# Patient Record
Sex: Female | Born: 1965
Health system: Southern US, Community
[De-identification: ages and names within clinical notes are randomized; demographics above are authoritative.]

## PROBLEM LIST (undated history)

## (undated) DIAGNOSIS — M1990 Unspecified osteoarthritis, unspecified site: Secondary | ICD-10-CM

## (undated) DIAGNOSIS — C541 Malignant neoplasm of endometrium: Secondary | ICD-10-CM

## (undated) DIAGNOSIS — C4491 Basal cell carcinoma of skin, unspecified: Secondary | ICD-10-CM

## (undated) DIAGNOSIS — Z8709 Personal history of other diseases of the respiratory system: Secondary | ICD-10-CM

## (undated) DIAGNOSIS — F419 Anxiety disorder, unspecified: Secondary | ICD-10-CM

## (undated) DIAGNOSIS — B999 Unspecified infectious disease: Secondary | ICD-10-CM

## (undated) DIAGNOSIS — E119 Type 2 diabetes mellitus without complications: Secondary | ICD-10-CM

## (undated) DIAGNOSIS — Z923 Personal history of irradiation: Secondary | ICD-10-CM

## (undated) HISTORY — DX: Personal history of irradiation: Z92.3

## (undated) HISTORY — DX: Basal cell carcinoma of skin, unspecified: C44.91

## (undated) HISTORY — PX: BASAL CELL CARCINOMA EXCISION: SHX1214

## (undated) HISTORY — PX: KNEE SURGERY: SHX244

## (undated) HISTORY — DX: Unspecified infectious disease: B99.9

## (undated) HISTORY — DX: Type 2 diabetes mellitus without complications: E11.9

## (undated) HISTORY — PX: TOOTH EXTRACTION: SUR596

---

## 2008-09-11 ENCOUNTER — Encounter: Admission: RE | Admit: 2008-09-11 | Discharge: 2008-09-11 | Payer: Self-pay | Admitting: Orthopedic Surgery

## 2008-10-17 ENCOUNTER — Encounter: Admission: RE | Admit: 2008-10-17 | Discharge: 2008-12-26 | Payer: Self-pay | Admitting: Orthopedic Surgery

## 2008-10-30 ENCOUNTER — Ambulatory Visit: Admission: RE | Admit: 2008-10-30 | Discharge: 2008-10-30 | Payer: Self-pay | Admitting: Orthopedic Surgery

## 2008-10-30 ENCOUNTER — Ambulatory Visit: Payer: Self-pay | Admitting: Vascular Surgery

## 2008-10-30 ENCOUNTER — Encounter (INDEPENDENT_AMBULATORY_CARE_PROVIDER_SITE_OTHER): Payer: Self-pay | Admitting: Orthopedic Surgery

## 2010-09-06 ENCOUNTER — Encounter: Payer: Self-pay | Admitting: Orthopedic Surgery

## 2013-05-16 ENCOUNTER — Ambulatory Visit (INDEPENDENT_AMBULATORY_CARE_PROVIDER_SITE_OTHER): Payer: BLUE CROSS/BLUE SHIELD | Admitting: Family Medicine

## 2013-05-16 ENCOUNTER — Encounter: Payer: Self-pay | Admitting: Family Medicine

## 2013-05-16 VITALS — BP 135/83 | HR 76 | Temp 97.5°F | Ht 68.0 in | Wt 158.0 lb

## 2013-05-16 DIAGNOSIS — J209 Acute bronchitis, unspecified: Secondary | ICD-10-CM

## 2013-05-16 MED ORDER — BENZONATATE 100 MG PO CAPS: 100.0000 mg | ORAL_CAPSULE | Freq: Two times a day (BID) | ORAL | Status: DC | PRN

## 2013-05-16 MED ORDER — AMOXICILLIN 875 MG PO TABS
875.0000 mg | ORAL_TABLET | Freq: Two times a day (BID) | ORAL | Status: DC
Start: 2013-05-16 — End: 2014-03-08

## 2013-05-16 NOTE — Progress Notes (Signed)
  Subjective:    Patient ID: [name], female    DOB: 08-09-1966, 47 y.o.   MRN: [id number]  HPI This 47 y.o. female presents for evaluation of sinus congestion, uri sx's, and persistent Cough.  She is having to take some otc cough medicine and it helps but she is still coughing And she is a smoker.   Review of Systems C/o cough and congestion. No chest pain, SOB, HA, dizziness, vision change, N/V, diarrhea, constipation, dysuria, urinary urgency or frequency, myalgias, arthralgias or rash.     Objective:   Physical Exam Vital signs noted  Well developed well nourished female.  HEENT - Head atraumatic Normocephalic                Eyes - PERRLA, Conjuctiva - clear Sclera- Clear EOMI                Ears - EAC's Wnl TM's Wnl Gross Hearing WNL                Throat - oropharanx wnl Respiratory - Lungs CTA bilateral Cardiac - RRR S1 and S2 without murmur GI - Abdomen soft Nontender and bowel sounds active x 4 Extremities - No edema. Neuro - Grossly intact.       Assessment & Plan:  Acute bronchitis - Plan: benzonatate (TESSALON) 100 MG capsule, amoxicillin (AMOXIL) 875 MG tablet.  Quit smoking.  Discussed strategies on how to quit smoking for over 10 minutes.  Deatra Canter FNP

## 2013-05-16 NOTE — Patient Instructions (Addendum)
Smoking Cessation Quitting smoking is important to your health and has many advantages. However, it is not always easy to quit since nicotine is a very addictive drug. Often times, people try 3 times or more before being able to quit. This document explains the best ways for you to prepare to quit smoking. Quitting takes hard work and a lot of effort, but you can do it. ADVANTAGES OF QUITTING SMOKING  You will live longer, feel better, and live better.  Your body will feel the impact of quitting smoking almost immediately.  Within 20 minutes, blood pressure decreases. Your pulse returns to its normal level.  After 8 hours, carbon monoxide levels in the blood return to normal. Your oxygen level increases.  After 24 hours, the chance of having a heart attack starts to decrease. Your breath, hair, and body stop smelling like smoke.  After 48 hours, damaged nerve endings begin to recover. Your sense of taste and smell improve.  After 72 hours, the body is virtually free of nicotine. Your bronchial tubes relax and breathing becomes easier.  After 2 to 12 weeks, lungs can hold more air. Exercise becomes easier and circulation improves.  The risk of having a heart attack, stroke, cancer, or lung disease is greatly reduced.  After 1 year, the risk of coronary heart disease is cut in half.  After 5 years, the risk of stroke falls to the same as a nonsmoker.  After 10 years, the risk of lung cancer is cut in half and the risk of other cancers decreases significantly.  After 15 years, the risk of coronary heart disease drops, usually to the level of a nonsmoker.  If you are pregnant, quitting smoking will improve your chances of having a healthy baby.  The people you live with, especially any children, will be healthier.  You will have extra money to spend on things other than cigarettes. QUESTIONS TO THINK ABOUT BEFORE ATTEMPTING TO QUIT You may want to talk about your answers with your  caregiver.  Why do you want to quit?  If you tried to quit in the past, what helped and what did not?  What will be the most difficult situations for you after you quit? How will you plan to handle them?  Who can help you through the tough times? Your family? Friends? A caregiver?  What pleasures do you get from smoking? What ways can you still get pleasure if you quit? Here are some questions to ask your caregiver:  How can you help me to be successful at quitting?  What medicine do you think would be best for me and how should I take it?  What should I do if I need more help?  What is smoking withdrawal like? How can I get information on withdrawal? GET READY  Set a quit date.  Change your environment by getting rid of all cigarettes, ashtrays, matches, and lighters in your home, car, or work. Do not let people smoke in your home.  Review your past attempts to quit. Think about what worked and what did not. GET SUPPORT AND ENCOURAGEMENT You have a better chance of being successful if you have help. You can get support in many ways.  Tell your family, friends, and co-workers that you are going to quit and need their support. Ask them not to smoke around you.  Get individual, group, or telephone counseling and support. Programs are available at local hospitals and health centers. Call your local health department for   information about programs in your area.  Spiritual beliefs and practices may help some smokers quit.  Download a "quit meter" on your computer to keep track of quit statistics, such as how long you have gone without smoking, cigarettes not smoked, and money saved.  Get a self-help book about quitting smoking and staying off of tobacco. LEARN NEW SKILLS AND BEHAVIORS  Distract yourself from urges to smoke. Talk to someone, go for a walk, or occupy your time with a task.  Change your normal routine. Take a different route to work. Drink tea instead of coffee.  Eat breakfast in a different place.  Reduce your stress. Take a hot bath, exercise, or read a book.  Plan something enjoyable to do every day. Reward yourself for not smoking.  Explore interactive web-based programs that specialize in helping you quit. GET MEDICINE AND USE IT CORRECTLY Medicines can help you stop smoking and decrease the urge to smoke. Combining medicine with the above behavioral methods and support can greatly increase your chances of successfully quitting smoking.  Nicotine replacement therapy helps deliver nicotine to your body without the negative effects and risks of smoking. Nicotine replacement therapy includes nicotine gum, lozenges, inhalers, nasal sprays, and skin patches. Some may be available over-the-counter and others require a prescription.  Antidepressant medicine helps people abstain from smoking, but how this works is unknown. This medicine is available by prescription.  Nicotinic receptor partial agonist medicine simulates the effect of nicotine in your brain. This medicine is available by prescription. Ask your caregiver for advice about which medicines to use and how to use them based on your health history. Your caregiver will tell you what side effects to look out for if you choose to be on a medicine or therapy. Carefully read the information on the package. Do not use any other product containing nicotine while using a nicotine replacement product.  RELAPSE OR DIFFICULT SITUATIONS Most relapses occur within the first 3 months after quitting. Do not be discouraged if you start smoking again. Remember, most people try several times before finally quitting. You may have symptoms of withdrawal because your body is used to nicotine. You may crave cigarettes, be irritable, feel very hungry, cough often, get headaches, or have difficulty concentrating. The withdrawal symptoms are only temporary. They are strongest when you first quit, but they will go away within  10 14 days. To reduce the chances of relapse, try to:  Avoid drinking alcohol. Drinking lowers your chances of successfully quitting.  Reduce the amount of caffeine you consume. Once you quit smoking, the amount of caffeine in your body increases and can give you symptoms, such as a rapid heartbeat, sweating, and anxiety.  Avoid smokers because they can make you want to smoke.  Do not let weight gain distract you. Many smokers will gain weight when they quit, usually less than 10 pounds. Eat a healthy diet and stay active. You can always lose the weight gained after you quit.  Find ways to improve your mood other than smoking. FOR MORE INFORMATION  www.smokefree.gov  Document Released: 07/27/2001 Document Revised: 02/01/2012 Document Reviewed: 11/11/2011 [employer] Patient Information 2014 [employer].  

## 2013-09-20 ENCOUNTER — Encounter: Payer: Self-pay | Admitting: Nurse Practitioner

## 2013-09-20 ENCOUNTER — Ambulatory Visit (INDEPENDENT_AMBULATORY_CARE_PROVIDER_SITE_OTHER): Payer: BC Managed Care – PPO | Admitting: Nurse Practitioner

## 2013-09-20 ENCOUNTER — Telehealth: Payer: Self-pay | Admitting: Family Medicine

## 2013-09-20 VITALS — BP 122/77 | HR 89 | Temp 98.4°F | Ht 68.0 in | Wt 159.0 lb

## 2013-09-20 DIAGNOSIS — R05 Cough: Secondary | ICD-10-CM

## 2013-09-20 DIAGNOSIS — R059 Cough, unspecified: Secondary | ICD-10-CM

## 2013-09-20 DIAGNOSIS — J209 Acute bronchitis, unspecified: Secondary | ICD-10-CM

## 2013-09-20 MED ORDER — PREDNISONE 20 MG PO TABS: ORAL_TABLET | ORAL | Status: DC

## 2013-09-20 NOTE — Telephone Encounter (Signed)
Persistent cough and congestion for a couple of weeks. Has seen the PA but cough will not resolve. Appt scheduled for this afternoon.

## 2013-09-20 NOTE — Progress Notes (Signed)
   Subjective:    Patient ID: [name], female    DOB: 1966/05/16, 48 y.o.   MRN: [id number]  HPI  Patient in c/o cough and congestion- has gotten worse over the last week. SAw PA at work and was given amoxicillin- still cannot get rid of congestion and cough.    Review of Systems  Constitutional: Negative for fever and chills.  HENT: Positive for congestion and sinus pressure. Negative for sore throat and trouble swallowing.   Respiratory: Positive for cough.   Cardiovascular: Negative.   Gastrointestinal: Negative.   Genitourinary: Negative.   All other systems reviewed and are negative.       Objective:   Physical Exam  Constitutional: She is oriented to person, place, and time. She appears well-developed and well-nourished.  HENT:  Right Ear: External ear normal.  Left Ear: External ear normal.  Nose: Nose normal.  Mouth/Throat: Oropharynx is clear and moist.  Eyes: Pupils are equal, round, and reactive to light.  Neck: Normal range of motion. Neck supple.  Cardiovascular: Normal rate, regular rhythm and normal heart sounds.   Pulmonary/Chest: Effort normal and breath sounds normal.  Neurological: She is alert and oriented to person, place, and time.  Skin: Skin is warm.  Psychiatric: She has a normal mood and affect. Her behavior is normal. Judgment and thought content normal.   BP 122/77  Pulse 89  Temp(Src) 98.4 F (36.9 C) (Oral)  Ht 5\' 8"  (1.727 m)  Wt 159 lb (72.122 kg)  BMI 24.18 kg/m2        Assessment & Plan:   1. Cough   2. Acute bronchitis    Meds ordered this encounter  Medications  . predniSONE (DELTASONE) 20 MG tablet    Sig: 2 po qd X 5 days    Dispense:  10 tablet    Refill:  0    Order Specific Question:  Supervising Provider    Answer:  Chipper Herb [[id number]]   1. Take meds as prescribed 2. Use a cool mist humidifier especially during the winter months and when heat has been humid. 3. Use saline nose sprays frequently 4.  Saline irrigations of the nose can be very helpful if done frequently.  * 4X daily for 1 week*  * Use of a nettie pot can be helpful with this. Follow directions with this* 5. Drink plenty of fluids 6. Keep thermostat turn down low 7.For any cough or congestion  Use plain Mucinex- regular strength or max strength is fine   * Children- consult with Pharmacist for dosing 8. For fever or aces or pains- take tylenol or ibuprofen appropriate for age and weight.  * for fevers greater than 101 orally you may alternate ibuprofen and tylenol every  3 hours.   Mary-Margaret Hassell Done, FNP

## 2013-09-20 NOTE — Patient Instructions (Signed)

## 2013-09-24 ENCOUNTER — Telehealth: Payer: Self-pay | Admitting: Family Medicine

## 2013-09-24 ENCOUNTER — Ambulatory Visit (INDEPENDENT_AMBULATORY_CARE_PROVIDER_SITE_OTHER): Payer: BC Managed Care – PPO | Admitting: Family Medicine

## 2013-09-24 ENCOUNTER — Encounter: Payer: Self-pay | Admitting: Family Medicine

## 2013-09-24 VITALS — BP 133/88 | HR 100 | Temp 97.9°F | Wt 163.0 lb

## 2013-09-24 DIAGNOSIS — J209 Acute bronchitis, unspecified: Secondary | ICD-10-CM | POA: Insufficient documentation

## 2013-09-24 DIAGNOSIS — Z720 Tobacco use: Secondary | ICD-10-CM | POA: Insufficient documentation

## 2013-09-24 DIAGNOSIS — R059 Cough, unspecified: Secondary | ICD-10-CM | POA: Insufficient documentation

## 2013-09-24 DIAGNOSIS — J9801 Acute bronchospasm: Secondary | ICD-10-CM | POA: Insufficient documentation

## 2013-09-24 DIAGNOSIS — F17200 Nicotine dependence, unspecified, uncomplicated: Secondary | ICD-10-CM

## 2013-09-24 DIAGNOSIS — R05 Cough: Secondary | ICD-10-CM

## 2013-09-24 LAB — POCT INFLUENZA A/B
Influenza A, POC: NEGATIVE
Influenza B, POC: NEGATIVE

## 2013-09-24 LAB — POCT RAPID STREP A (OFFICE): Rapid Strep A Screen: NEGATIVE

## 2013-09-24 MED ORDER — GUAIFENESIN-CODEINE 100-10 MG/5ML PO SYRP: 5.0000 mL | ORAL_SOLUTION | Freq: Three times a day (TID) | ORAL | Status: DC | PRN

## 2013-09-24 MED ORDER — ALBUTEROL SULFATE HFA 108 (90 BASE) MCG/ACT IN AERS: 2.0000 | INHALATION_SPRAY | Freq: Four times a day (QID) | RESPIRATORY_TRACT | Status: DC | PRN

## 2013-09-24 NOTE — Patient Instructions (Signed)
Smoking Cessation Quitting smoking is important to your health and has many advantages. However, it is not always easy to quit since nicotine is a very addictive drug. Often times, people try 3 times or more before being able to quit. This document explains the best ways for you to prepare to quit smoking. Quitting takes hard work and a lot of effort, but you can do it. ADVANTAGES OF QUITTING SMOKING  You will live longer, feel better, and live better.  Your body will feel the impact of quitting smoking almost immediately.  Within 20 minutes, blood pressure decreases. Your pulse returns to its normal level.  After 8 hours, carbon monoxide levels in the blood return to normal. Your oxygen level increases.  After 24 hours, the chance of having a heart attack starts to decrease. Your breath, hair, and body stop smelling like smoke.  After 48 hours, damaged nerve endings begin to recover. Your sense of taste and smell improve.  After 72 hours, the body is virtually free of nicotine. Your bronchial tubes relax and breathing becomes easier.  After 2 to 12 weeks, lungs can hold more air. Exercise becomes easier and circulation improves.  The risk of having a heart attack, stroke, cancer, or lung disease is greatly reduced.  After 1 year, the risk of coronary heart disease is cut in half.  After 5 years, the risk of stroke falls to the same as a nonsmoker.  After 10 years, the risk of lung cancer is cut in half and the risk of other cancers decreases significantly.  After 15 years, the risk of coronary heart disease drops, usually to the level of a nonsmoker.  If you are pregnant, quitting smoking will improve your chances of having a healthy baby.  The people you live with, especially any children, will be healthier.  You will have extra money to spend on things other than cigarettes. QUESTIONS TO THINK ABOUT BEFORE ATTEMPTING TO QUIT You may want to talk about your answers with your  caregiver.  Why do you want to quit?  If you tried to quit in the past, what helped and what did not?  What will be the most difficult situations for you after you quit? How will you plan to handle them?  Who can help you through the tough times? Your family? Friends? A caregiver?  What pleasures do you get from smoking? What ways can you still get pleasure if you quit? Here are some questions to ask your caregiver:  How can you help me to be successful at quitting?  What medicine do you think would be best for me and how should I take it?  What should I do if I need more help?  What is smoking withdrawal like? How can I get information on withdrawal? GET READY  Set a quit date.  Change your environment by getting rid of all cigarettes, ashtrays, matches, and lighters in your home, car, or work. Do not let people smoke in your home.  Review your past attempts to quit. Think about what worked and what did not. GET SUPPORT AND ENCOURAGEMENT You have a better chance of being successful if you have help. You can get support in many ways.  Tell your family, friends, and co-workers that you are going to quit and need their support. Ask them not to smoke around you.  Get individual, group, or telephone counseling and support. Programs are available at local hospitals and health centers. Call your local health department for   information about programs in your area.  Spiritual beliefs and practices may help some smokers quit.  Download a "quit meter" on your computer to keep track of quit statistics, such as how long you have gone without smoking, cigarettes not smoked, and money saved.  Get a self-help book about quitting smoking and staying off of tobacco. LEARN NEW SKILLS AND BEHAVIORS  Distract yourself from urges to smoke. Talk to someone, go for a walk, or occupy your time with a task.  Change your normal routine. Take a different route to work. Drink tea instead of coffee.  Eat breakfast in a different place.  Reduce your stress. Take a hot bath, exercise, or read a book.  Plan something enjoyable to do every day. Reward yourself for not smoking.  Explore interactive web-based programs that specialize in helping you quit. GET MEDICINE AND USE IT CORRECTLY Medicines can help you stop smoking and decrease the urge to smoke. Combining medicine with the above behavioral methods and support can greatly increase your chances of successfully quitting smoking.  Nicotine replacement therapy helps deliver nicotine to your body without the negative effects and risks of smoking. Nicotine replacement therapy includes nicotine gum, lozenges, inhalers, nasal sprays, and skin patches. Some may be available over-the-counter and others require a prescription.  Antidepressant medicine helps people abstain from smoking, but how this works is unknown. This medicine is available by prescription.  Nicotinic receptor partial agonist medicine simulates the effect of nicotine in your brain. This medicine is available by prescription. Ask your caregiver for advice about which medicines to use and how to use them based on your health history. Your caregiver will tell you what side effects to look out for if you choose to be on a medicine or therapy. Carefully read the information on the package. Do not use any other product containing nicotine while using a nicotine replacement product.  RELAPSE OR DIFFICULT SITUATIONS Most relapses occur within the first 3 months after quitting. Do not be discouraged if you start smoking again. Remember, most people try several times before finally quitting. You may have symptoms of withdrawal because your body is used to nicotine. You may crave cigarettes, be irritable, feel very hungry, cough often, get headaches, or have difficulty concentrating. The withdrawal symptoms are only temporary. They are strongest when you first quit, but they will go away within  10 14 days. To reduce the chances of relapse, try to:  Avoid drinking alcohol. Drinking lowers your chances of successfully quitting.  Reduce the amount of caffeine you consume. Once you quit smoking, the amount of caffeine in your body increases and can give you symptoms, such as a rapid heartbeat, sweating, and anxiety.  Avoid smokers because they can make you want to smoke.  Do not let weight gain distract you. Many smokers will gain weight when they quit, usually less than 10 pounds. Eat a healthy diet and stay active. You can always lose the weight gained after you quit.  Find ways to improve your mood other than smoking. FOR MORE INFORMATION  www.smokefree.gov  Document Released: 07/27/2001 Document Revised: 02/01/2012 Document Reviewed: 11/11/2011 [employer] Patient Information 2014 [employer].  

## 2013-09-24 NOTE — Telephone Encounter (Signed)
appt at 10:20 with wong

## 2013-09-24 NOTE — Progress Notes (Signed)
Patient ID: [name], female   DOB: Oct 25, 1965, 48 y.o.   MRN: [id number] SUBJECTIVE: CC: Chief Complaint  Patient presents with  . Acute Visit    cough sore throat been taking otc meds and antibiotic saw MMM last Thurday    HPI:  Sore Throat Patient complains of sore throat. Associated symptoms include chest congestion, coryza, hoarseness, nasal blockage, night sweats, post nasal drip, sinus and nasal congestion and sore throat. Onset of symptoms was 2 weeks ago, and have been unchanged since that time. She is drinking plenty of fluids. She has not had recent close exposure to someone with proven streptococcal pharyngitis.  Past Medical History  Diagnosis Date  . Basal cell carcinoma     face   Past Surgical History  Procedure Laterality Date  . Knee surgery    . Basal cell carcinoma excision      face   History   Social History  . Marital Status: Married    Spouse Name: N/A    Number of Children: N/A  . Years of Education: N/A   Occupational History  . Not on file.   Social History Main Topics  . Smoking status: Current Every Day Smoker -- 1.00 packs/day    Types: Cigarettes  . Smokeless tobacco: Never Used  . Alcohol Use: No  . Drug Use: No  . Sexual Activity: Not on file   Other Topics Concern  . Not on file   Social History Narrative  . No narrative on file   Family History  Problem Relation Age of Onset  . Hypertension Mother   . Heart disease Father   . Arthritis Sister     rheumatoid  . Cancer Brother     prostate   Current Outpatient Prescriptions on File Prior to Visit  Medication Sig Dispense Refill  . amoxicillin (AMOXIL) 875 MG tablet Take 1 tablet (875 mg total) by mouth 2 (two) times daily.  20 tablet  0  . predniSONE (DELTASONE) 20 MG tablet 2 po qd X 5 days  10 tablet  0   No current facility-administered medications on file prior to visit.   Not on File Immunization History  Administered Date(s) Administered  .  Influenza,inj,Quad PF,36+ Mos 06/07/2013   Prior to Admission medications   Medication Sig Start Date End Date Taking? Authorizing Provider  amoxicillin (AMOXIL) 875 MG tablet Take 1 tablet (875 mg total) by mouth 2 (two) times daily. 05/16/13  Yes Lysbeth Penner, FNP  benzonatate (TESSALON) 100 MG capsule Take 1 capsule (100 mg total) by mouth 2 (two) times daily as needed for cough. 05/16/13   Lysbeth Penner, FNP  predniSONE (DELTASONE) 20 MG tablet 2 po qd X 5 days 09/20/13   Mary-Margaret Hassell Done, FNP     ROS: As above in the HPI. All other systems are stable or negative.  OBJECTIVE: APPEARANCE:  Patient in no acute distress.The patient appeared well nourished and normally developed. Acyanotic. Waist: VITAL SIGNS:BP 133/88  Pulse 100  Temp(Src) 97.9 F (36.6 C) (Oral)  Wt 163 lb (73.936 kg)  SpO2 97%  WF SKIN: warm and  Dry without overt rashes, tattoos and scars  HEAD and Neck: without JVD, Head and scalp: normal Eyes:No scleral icterus. Fundi normal, eye movements normal. Ears: Auricle normal, canal normal, Tympanic membranes normal, insufflation normal. Nose: nasally congested. Throat: normal Neck & thyroid: normal  CHEST & LUNGS: Chest wall: normal Lungs: Coarse bs and rhonchi anmd end expiratory wheezes.   CVS: Reveals  the PMI to be normally located. Regular rhythm, First and Second Heart sounds are normal,  absence of murmurs, rubs or gallops. Peripheral vasculature: Radial pulses: normal Dorsal pedis pulses: normal Posterior pulses: normal  ABDOMEN:  Appearance: normal Benign, no organomegaly, no masses, no Abdominal Aortic enlargement. No Guarding , no rebound. No Bruits. Bowel sounds: normal  RECTAL: N/A GU: N/A  EXTREMETIES: nonedematous.  MUSCULOSKELETAL:  Spine: normal Joints: intact  NEUROLOGIC: oriented to time,place and person; nonfocal. Strength is normal Sensory is normal Reflexes are normal Cranial Nerves are  normal.  ASSESSMENT: Cough - Plan: POCT rapid strep A, POCT Influenza A/B, guaiFENesin-codeine (ROBITUSSIN AC) 100-10 MG/5ML syrup  Acute bronchitis - Plan: guaiFENesin-codeine (ROBITUSSIN AC) 100-10 MG/5ML syrup  Bronchospasm - Plan: albuterol (PROVENTIL HFA;VENTOLIN HFA) 108 (90 BASE) MCG/ACT inhaler  Tobacco user Her respiratory illness is worsened by the cigarette smoking.   PLAN:  Smoking cessation counseling and hand out in the AVS. Counseled for 7 minutes.  Orders Placed This Encounter  Procedures  . POCT rapid strep A  . POCT Influenza A/B   Results for orders placed in visit on 09/24/13  POCT RAPID STREP A (OFFICE)      Result Value Range   Rapid Strep A Screen Negative  Negative  POCT INFLUENZA A/B      Result Value Range   Influenza A, POC Negative     Influenza B, POC Negative      Meds ordered this encounter  Medications  . guaiFENesin-codeine (ROBITUSSIN AC) 100-10 MG/5ML syrup    Sig: Take 5 mLs by mouth 3 (three) times daily as needed for cough.    Dispense:  120 mL    Refill:  0  . albuterol (PROVENTIL HFA;VENTOLIN HFA) 108 (90 BASE) MCG/ACT inhaler    Sig: Inhale 2 puffs into the lungs every 6 (six) hours as needed for wheezing or shortness of breath.    Dispense:  1 Inhaler    Refill:  0  complete the antibiotics.   Medications Discontinued During This Encounter  Medication Reason  . benzonatate (TESSALON) 100 MG capsule Patient Preference   Return in about 2 weeks (around 10/08/2013) for Recheck medical problems.  Cheronda Erck P. Jacelyn Grip, M.D.

## 2014-03-08 ENCOUNTER — Encounter: Payer: Self-pay | Admitting: General Practice

## 2014-03-08 ENCOUNTER — Ambulatory Visit (INDEPENDENT_AMBULATORY_CARE_PROVIDER_SITE_OTHER): Payer: BC Managed Care – PPO | Admitting: General Practice

## 2014-03-08 ENCOUNTER — Ambulatory Visit (INDEPENDENT_AMBULATORY_CARE_PROVIDER_SITE_OTHER): Payer: BC Managed Care – PPO

## 2014-03-08 VITALS — BP 133/85 | HR 85 | Temp 97.8°F | Ht 68.0 in | Wt 160.6 lb

## 2014-03-08 DIAGNOSIS — K5900 Constipation, unspecified: Secondary | ICD-10-CM

## 2014-03-08 DIAGNOSIS — R109 Unspecified abdominal pain: Secondary | ICD-10-CM

## 2014-03-08 LAB — POCT URINALYSIS DIPSTICK
Bilirubin, UA: NEGATIVE
GLUCOSE UA: NEGATIVE
Ketones, UA: NEGATIVE
Leukocytes, UA: NEGATIVE
NITRITE UA: NEGATIVE
PH UA: 6
PROTEIN UA: NEGATIVE
RBC UA: NEGATIVE
SPEC GRAV UA: 1.01
UROBILINOGEN UA: NEGATIVE

## 2014-03-08 LAB — POCT UA - MICROSCOPIC ONLY
Bacteria, U Microscopic: NEGATIVE
CASTS, UR, LPF, POC: NEGATIVE
CRYSTALS, UR, HPF, POC: NEGATIVE
Mucus, UA: NEGATIVE
RBC, URINE, MICROSCOPIC: NEGATIVE
WBC, Ur, HPF, POC: NEGATIVE
Yeast, UA: NEGATIVE

## 2014-03-08 NOTE — Progress Notes (Signed)
   Subjective:    Patient ID: [name], female    DOB: [birth year], 48 y.o.   MRN: [id number]  Abdominal Pain This is a recurrent problem. The current episode started 1 to 4 weeks ago. The onset quality is sudden. The problem occurs rarely. The problem has been gradually worsening. The pain is located in the LLQ. The pain is at a severity of 5/10. The pain is mild. The quality of the pain is sharp. Associated symptoms include frequency and nausea. Pertinent negatives include no belching, constipation, diarrhea, fever, headaches or vomiting. The pain is aggravated by movement. The pain is relieved by certain positions. Treatments tried: OTC Motrin. There is no history of abdominal surgery, gallstones, GERD or irritable bowel syndrome.      Review of Systems  Constitutional: Negative for fever and chills.  Respiratory: Negative for cough and chest tightness.   Cardiovascular: Negative for chest pain and palpitations.  Gastrointestinal: Positive for nausea and abdominal pain. Negative for vomiting, diarrhea and constipation.  Genitourinary: Positive for frequency.  Musculoskeletal: Negative for back pain.  Neurological: Negative for dizziness, weakness and headaches.       Objective:   Physical Exam  Constitutional: She is oriented to person, place, and time. She appears well-developed and well-nourished.  Cardiovascular: Normal rate, regular rhythm and normal heart sounds.   Pulmonary/Chest: Effort normal and breath sounds normal. No respiratory distress. She exhibits no tenderness.  Abdominal: Soft. Bowel sounds are normal. She exhibits no distension. There is tenderness.  Left lower quadrant tenderness upon palpation  Neurological: She is alert and oriented to person, place, and time.  Skin: Skin is warm and dry.  Psychiatric: She has a normal mood and affect.   Results for orders placed in visit on 03/08/14  POCT URINALYSIS DIPSTICK      Result Value Ref Range   Color, UA  yellow     Clarity, UA clear     Glucose, UA neg     Bilirubin, UA neg     Ketones, UA neg     Spec Grav, UA 1.010     Blood, UA neg     pH, UA 6.0     Protein, UA neg     Urobilinogen, UA negative     Nitrite, UA neg     Leukocytes, UA Negative    POCT UA - MICROSCOPIC ONLY      Result Value Ref Range   WBC, Ur, HPF, POC neg     RBC, urine, microscopic neg     Bacteria, U Microscopic neg     Mucus, UA neg     Epithelial cells, urine per micros occ     Crystals, Ur, HPF, POC neg     Casts, Ur, LPF, POC neg     Yeast, UA neg      [facility] reading (PRIMARY) by Erby Pian, FNP-C, Moderate to large amount of stool noted in left colon.       Assessment & Plan:  1. Abdominal pain, unspecified site  - POCT urinalysis dipstick - POCT UA - Microscopic Only - DG Abd 1 View; Future  2. Unspecified constipation -Discussed and provided patient educational material, constipation -Miralax 17grams daily, for 1-4 days, until bowel movement  Increase fluid intake (water) Increase fiber in diet (fruits, vegetables, whole grains) Take stool softner daily RTO if symptoms worsen, may need abdominal ultrasound or CT -Patient verbalized understanding Erby Pian, FNP-C

## 2014-03-08 NOTE — Patient Instructions (Addendum)
Constipation  Constipation is when a person has fewer than three bowel movements a week, has difficulty having a bowel movement, or has stools that are dry, hard, or larger than normal. As people grow older, constipation is more common. If you try to fix constipation with medicines that make you have a bowel movement (laxatives), the problem may get worse. Long-term laxative use may cause the muscles of the colon to become weak. A low-fiber diet, not taking in enough fluids, and taking certain medicines may make constipation worse.   CAUSES   · Certain medicines, such as antidepressants, pain medicine, iron supplements, antacids, and water pills.    · Certain diseases, such as diabetes, irritable bowel syndrome (IBS), thyroid disease, or depression.    · Not drinking enough water.    · Not eating enough fiber-rich foods.    · Stress or travel.    · Lack of physical activity or exercise.    · Ignoring the urge to have a bowel movement.    · Using laxatives too much.    SIGNS AND SYMPTOMS   · Having fewer than three bowel movements a week.    · Straining to have a bowel movement.    · Having stools that are hard, dry, or larger than normal.    · Feeling full or bloated.    · Pain in the lower abdomen.    · Not feeling relief after having a bowel movement.    DIAGNOSIS   Your health care provider will take a medical history and perform a physical exam. Further testing may be done for severe constipation. Some tests may include:  · A barium enema X-ray to examine your rectum, colon, and, sometimes, your small intestine.    · A sigmoidoscopy to examine your lower colon.    · A colonoscopy to examine your entire colon.  TREATMENT   Treatment will depend on the severity of your constipation and what is causing it. Some dietary treatments include drinking more fluids and eating more fiber-rich foods. Lifestyle treatments may include regular exercise. If these diet and lifestyle recommendations do not help, your health care  provider may recommend taking over-the-counter laxative medicines to help you have bowel movements. Prescription medicines may be prescribed if over-the-counter medicines do not work.   HOME CARE INSTRUCTIONS   · Eat foods that have a lot of fiber, such as fruits, vegetables, whole grains, and beans.  · Limit foods high in fat and processed sugars, such as french fries, hamburgers, cookies, candies, and soda.    · A fiber supplement may be added to your diet if you cannot get enough fiber from foods.    · Drink enough fluids to keep your urine clear or pale yellow.    · Exercise regularly or as directed by your health care provider.    · Go to the restroom when you have the urge to go. Do not hold it.    · Only take over-the-counter or prescription medicines as directed by your health care provider. Do not take other medicines for constipation without talking to your health care provider first.    SEEK IMMEDIATE MEDICAL CARE IF:   · You have bright red blood in your stool.    · Your constipation lasts for more than 4 days or gets worse.    · You have abdominal or rectal pain.    · You have thin, pencil-like stools.    · You have unexplained weight loss.  MAKE SURE YOU:   · Understand these instructions.  · Will watch your condition.  · Will get help right away if you are not   you have with your health care provider.  Miralax 17grams daily, for 1-4 days, until bowel movement  Increase fluid intake (water) Increase fiber in diet (fruits, vegetables, whole grains) Take stool softner daily

## 2014-05-23 ENCOUNTER — Telehealth: Payer: Self-pay | Admitting: General Practice

## 2014-05-23 ENCOUNTER — Encounter: Payer: Self-pay | Admitting: Family Medicine

## 2014-05-23 ENCOUNTER — Ambulatory Visit (INDEPENDENT_AMBULATORY_CARE_PROVIDER_SITE_OTHER): Payer: BC Managed Care – PPO | Admitting: Family Medicine

## 2014-05-23 VITALS — BP 136/80 | HR 74 | Temp 97.9°F | Ht 68.0 in | Wt 158.0 lb

## 2014-05-23 DIAGNOSIS — M67432 Ganglion, left wrist: Secondary | ICD-10-CM

## 2014-05-23 NOTE — Patient Instructions (Addendum)
We will arrange for you to have an appointment with a hand surgeon in [place] He may need to remove the cyst as it continues to recur. If you have a lot of pain in your hand he can take some ibuprofen over-the-counter

## 2014-05-23 NOTE — Progress Notes (Signed)
   Subjective:    Patient ID: [name], female    DOB: [birth year], 48 y.o.   MRN: [id number]  HPI Patient here today for left wrist cyst. This cyst ruptures periodically and then returns. She is beginning to have pain in the fourth and fifth finger area of the same hand. This cyst is on the volar surface of the wrist        Patient Active Problem List   Diagnosis Date Noted  . Bronchospasm 09/24/2013  . Acute bronchitis 09/24/2013  . Cough 09/24/2013  . Tobacco user 09/24/2013   Outpatient Encounter Prescriptions as of 05/23/2014  Medication Sig  . albuterol (PROVENTIL HFA;VENTOLIN HFA) 108 (90 BASE) MCG/ACT inhaler Inhale 2 puffs into the lungs every 6 (six) hours as needed for wheezing or shortness of breath.    Review of Systems  Constitutional: Negative.   HENT: Negative.   Eyes: Negative.   Respiratory: Negative.   Cardiovascular: Negative.   Gastrointestinal: Negative.   Endocrine: Negative.   Genitourinary: Negative.   Musculoskeletal: Negative.   Skin: Negative.        Left wrist cyst   Allergic/Immunologic: Negative.   Neurological: Negative.   Hematological: Negative.   Psychiatric/Behavioral: Negative.        Objective:   Physical Exam  Nursing note and vitals reviewed. Constitutional: She is oriented to person, place, and time. She appears well-developed and well-nourished. No distress.  HENT:  Head: Normocephalic.  Eyes: Conjunctivae and EOM are normal. Pupils are equal, round, and reactive to light. Right eye exhibits no discharge. Left eye exhibits no discharge. No scleral icterus.  Musculoskeletal: Normal range of motion.  .There is a tender cyst on the volar surface of the left wrist, there is some pain in the distal fingers on the same hand at times   Neurological: She is alert and oriented to person, place, and time.  Skin: Skin is warm and dry. No rash noted.  Psychiatric: She has a normal mood and affect. Her behavior is normal. Thought  content normal.   BP 136/80  Pulse 74  Temp(Src) 97.9 F (36.6 C) (Oral)  Ht 5\' 8"  (1.727 m)  Wt 158 lb (71.668 kg)  BMI 24.03 kg/m2        Assessment & Plan:  1. Ganglion cyst of wrist, left  Patient Instructions  We will arrange for you to have an appointment with a hand surgeon in [place] He may need to remove the cyst as it continues to recur. If you have a lot of pain in your hand he can take some ibuprofen over-the-counter   Arrie Senate MD

## 2014-05-23 NOTE — Addendum Note (Signed)
Addended by: Zannie Cove on: 05/23/2014 04:55 PM   Modules accepted: Orders

## 2014-05-23 NOTE — Telephone Encounter (Signed)
appt given for 4 today with moore

## 2014-06-18 ENCOUNTER — Other Ambulatory Visit: Payer: Self-pay | Admitting: Orthopedic Surgery

## 2014-08-02 ENCOUNTER — Encounter (HOSPITAL_BASED_OUTPATIENT_CLINIC_OR_DEPARTMENT_OTHER): Payer: Self-pay | Admitting: *Deleted

## 2014-08-02 NOTE — Progress Notes (Signed)
No labs needed

## 2014-08-06 ENCOUNTER — Encounter (HOSPITAL_BASED_OUTPATIENT_CLINIC_OR_DEPARTMENT_OTHER)
Admission: RE | Disposition: A | Discharge status: Home / Self Care | Payer: Self-pay | Source: Ambulatory Visit | Attending: Orthopedic Surgery

## 2014-08-06 ENCOUNTER — Ambulatory Visit (HOSPITAL_BASED_OUTPATIENT_CLINIC_OR_DEPARTMENT_OTHER): Payer: BC Managed Care – PPO | Admitting: Anesthesiology

## 2014-08-06 ENCOUNTER — Encounter (HOSPITAL_BASED_OUTPATIENT_CLINIC_OR_DEPARTMENT_OTHER): Payer: Self-pay | Admitting: Anesthesiology

## 2014-08-06 ENCOUNTER — Ambulatory Visit (HOSPITAL_BASED_OUTPATIENT_CLINIC_OR_DEPARTMENT_OTHER)
Admission: RE | Admit: 2014-08-06 | Discharge: 2014-08-06 | Disposition: A | Discharge status: Home / Self Care | Payer: BC Managed Care – PPO | Source: Ambulatory Visit | Attending: Orthopedic Surgery | Admitting: Orthopedic Surgery

## 2014-08-06 DIAGNOSIS — M67432 Ganglion, left wrist: Secondary | ICD-10-CM | POA: Diagnosis present

## 2014-08-06 DIAGNOSIS — Z8249 Family history of ischemic heart disease and other diseases of the circulatory system: Secondary | ICD-10-CM | POA: Insufficient documentation

## 2014-08-06 DIAGNOSIS — Z8042 Family history of malignant neoplasm of prostate: Secondary | ICD-10-CM | POA: Insufficient documentation

## 2014-08-06 DIAGNOSIS — Z8261 Family history of arthritis: Secondary | ICD-10-CM | POA: Insufficient documentation

## 2014-08-06 DIAGNOSIS — Z85828 Personal history of other malignant neoplasm of skin: Secondary | ICD-10-CM | POA: Insufficient documentation

## 2014-08-06 DIAGNOSIS — F17210 Nicotine dependence, cigarettes, uncomplicated: Secondary | ICD-10-CM | POA: Insufficient documentation

## 2014-08-06 HISTORY — PX: GANGLION CYST EXCISION: SHX1691

## 2014-08-06 HISTORY — DX: Personal history of other diseases of the respiratory system: Z87.09

## 2014-08-06 LAB — POCT HEMOGLOBIN-HEMACUE: Hemoglobin: 14.8 g/dL (ref 12.0–15.0)

## 2014-08-06 SURGERY — EXCISION, GANGLION CYST, WRIST
Anesthesia: General | Site: Wrist | Laterality: Left

## 2014-08-06 MED ORDER — CEFAZOLIN SODIUM-DEXTROSE 2-3 GM-% IV SOLR
2.0000 g | INTRAVENOUS | Status: AC
Start: 2014-08-06 — End: 2014-08-06
  Administered 2014-08-06: 2 g via INTRAVENOUS

## 2014-08-06 MED ORDER — HYDROCODONE-ACETAMINOPHEN 5-325 MG PO TABS: ORAL_TABLET | ORAL | Status: DC

## 2014-08-06 MED ORDER — BUPIVACAINE HCL (PF) 0.25 % IJ SOLN
INTRAMUSCULAR | Status: DC | PRN
  Administered 2014-08-06: 7 mL

## 2014-08-06 MED ORDER — FENTANYL CITRATE 0.05 MG/ML IJ SOLN
INTRAMUSCULAR | Status: DC | PRN
  Administered 2014-08-06 (×2): 50 ug via INTRAVENOUS

## 2014-08-06 MED ORDER — PROPOFOL 10 MG/ML IV BOLUS
INTRAVENOUS | Status: DC | PRN
  Administered 2014-08-06: 200 mg via INTRAVENOUS

## 2014-08-06 MED ORDER — MIDAZOLAM HCL 2 MG/2ML IJ SOLN: 1.0000 mg | INTRAMUSCULAR | Status: DC | PRN

## 2014-08-06 MED ORDER — ONDANSETRON HCL 4 MG/2ML IJ SOLN
INTRAMUSCULAR | Status: DC | PRN
  Administered 2014-08-06: 4 mg via INTRAVENOUS

## 2014-08-06 MED ORDER — CEFAZOLIN SODIUM-DEXTROSE 2-3 GM-% IV SOLR
INTRAVENOUS | Status: AC
  Filled 2014-08-06: qty 50

## 2014-08-06 MED ORDER — HYDROMORPHONE HCL 1 MG/ML IJ SOLN
0.2500 mg | INTRAMUSCULAR | Status: DC | PRN
  Administered 2014-08-06 (×2): 0.5 mg via INTRAVENOUS

## 2014-08-06 MED ORDER — MIDAZOLAM HCL 2 MG/2ML IJ SOLN
INTRAMUSCULAR | Status: AC
  Filled 2014-08-06: qty 2

## 2014-08-06 MED ORDER — ONDANSETRON HCL 4 MG/2ML IJ SOLN: 4.0000 mg | Freq: Once | INTRAMUSCULAR | Status: DC | PRN

## 2014-08-06 MED ORDER — LACTATED RINGERS IV SOLN
INTRAVENOUS | Status: DC
  Administered 2014-08-06: 08:00:00 via INTRAVENOUS

## 2014-08-06 MED ORDER — FENTANYL CITRATE 0.05 MG/ML IJ SOLN
INTRAMUSCULAR | Status: AC
  Filled 2014-08-06: qty 4

## 2014-08-06 MED ORDER — BUPIVACAINE HCL (PF) 0.25 % IJ SOLN
INTRAMUSCULAR | Status: AC
  Filled 2014-08-06: qty 30

## 2014-08-06 MED ORDER — MIDAZOLAM HCL 5 MG/5ML IJ SOLN
INTRAMUSCULAR | Status: DC | PRN
  Administered 2014-08-06: 2 mg via INTRAVENOUS

## 2014-08-06 MED ORDER — CHLORHEXIDINE GLUCONATE 4 % EX LIQD: 60.0000 mL | Freq: Once | CUTANEOUS | Status: DC

## 2014-08-06 MED ORDER — HYDROMORPHONE HCL 1 MG/ML IJ SOLN
INTRAMUSCULAR | Status: AC
  Filled 2014-08-06: qty 1

## 2014-08-06 MED ORDER — FENTANYL CITRATE 0.05 MG/ML IJ SOLN: 50.0000 ug | INTRAMUSCULAR | Status: DC | PRN

## 2014-08-06 MED ORDER — LIDOCAINE HCL (CARDIAC) 20 MG/ML IV SOLN
INTRAVENOUS | Status: DC | PRN
  Administered 2014-08-06: 80 mg via INTRAVENOUS

## 2014-08-06 MED ORDER — DEXAMETHASONE SODIUM PHOSPHATE 4 MG/ML IJ SOLN
INTRAMUSCULAR | Status: DC | PRN
  Administered 2014-08-06: 10 mg via INTRAVENOUS

## 2014-08-06 SURGICAL SUPPLY — 47 items
BANDAGE ELASTIC 3 VELCRO ST LF (GAUZE/BANDAGES/DRESSINGS) ×2 IMPLANT
BENZOIN TINCTURE PRP APPL 2/3 (GAUZE/BANDAGES/DRESSINGS) IMPLANT
BLADE MINI RND TIP GREEN BEAV (BLADE) ×2 IMPLANT
BLADE SURG 15 STRL LF DISP TIS (BLADE) ×2 IMPLANT
BLADE SURG 15 STRL SS (BLADE) ×2
BNDG ELASTIC 2 VLCR STRL LF (GAUZE/BANDAGES/DRESSINGS) IMPLANT
BNDG ESMARK 4X9 LF (GAUZE/BANDAGES/DRESSINGS) ×2 IMPLANT
BNDG GAUZE ELAST 4 BULKY (GAUZE/BANDAGES/DRESSINGS) ×2 IMPLANT
CHLORAPREP W/TINT 26ML (MISCELLANEOUS) ×2 IMPLANT
CORDS BIPOLAR (ELECTRODE) ×2 IMPLANT
COVER BACK TABLE 60X90IN (DRAPES) ×2 IMPLANT
COVER MAYO STAND STRL (DRAPES) ×2 IMPLANT
CUFF TOURNIQUET SINGLE 18IN (TOURNIQUET CUFF) ×2 IMPLANT
DRAPE EXTREMITY T 121X128X90 (DRAPE) ×2 IMPLANT
DRAPE SURG 17X23 STRL (DRAPES) ×2 IMPLANT
DRSG PAD ABDOMINAL 8X10 ST (GAUZE/BANDAGES/DRESSINGS) ×2 IMPLANT
GAUZE SPONGE 4X4 12PLY STRL (GAUZE/BANDAGES/DRESSINGS) ×2 IMPLANT
GAUZE XEROFORM 1X8 LF (GAUZE/BANDAGES/DRESSINGS) ×2 IMPLANT
GLOVE BIO SURGEON STRL SZ7.5 (GLOVE) ×2 IMPLANT
GLOVE BIOGEL PI IND STRL 7.0 (GLOVE) ×1 IMPLANT
GLOVE BIOGEL PI IND STRL 8 (GLOVE) ×1 IMPLANT
GLOVE BIOGEL PI INDICATOR 7.0 (GLOVE) ×1
GLOVE BIOGEL PI INDICATOR 8 (GLOVE) ×1
GLOVE ECLIPSE 6.5 STRL STRAW (GLOVE) ×2 IMPLANT
GLOVE EXAM NITRILE LRG STRL (GLOVE) ×2 IMPLANT
GOWN STRL REUS W/ TWL LRG LVL3 (GOWN DISPOSABLE) ×1 IMPLANT
GOWN STRL REUS W/TWL LRG LVL3 (GOWN DISPOSABLE) ×1
GOWN STRL REUS W/TWL XL LVL3 (GOWN DISPOSABLE) ×4 IMPLANT
NEEDLE HYPO 25X1 1.5 SAFETY (NEEDLE) ×2 IMPLANT
NS IRRIG 1000ML POUR BTL (IV SOLUTION) ×2 IMPLANT
PACK BASIN DAY SURGERY FS (CUSTOM PROCEDURE TRAY) ×2 IMPLANT
PAD CAST 3X4 CTTN HI CHSV (CAST SUPPLIES) ×1 IMPLANT
PADDING CAST ABS 4INX4YD NS (CAST SUPPLIES)
PADDING CAST ABS COTTON 4X4 ST (CAST SUPPLIES) IMPLANT
PADDING CAST COTTON 3X4 STRL (CAST SUPPLIES) ×1
SPLINT PLASTER CAST XFAST 3X15 (CAST SUPPLIES) ×10 IMPLANT
SPLINT PLASTER XTRA FASTSET 3X (CAST SUPPLIES) ×10
STOCKINETTE 4X48 STRL (DRAPES) ×2 IMPLANT
STRIP CLOSURE SKIN 1/2X4 (GAUZE/BANDAGES/DRESSINGS) IMPLANT
SUT ETHILON 4 0 PS 2 18 (SUTURE) ×2 IMPLANT
SUT MNCRL AB 4-0 PS2 18 (SUTURE) ×2 IMPLANT
SUT MON AB 5-0 PS2 18 (SUTURE) IMPLANT
SUT VIC AB 4-0 P2 18 (SUTURE) IMPLANT
SYR BULB 3OZ (MISCELLANEOUS) ×2 IMPLANT
SYR CONTROL 10ML LL (SYRINGE) ×2 IMPLANT
TOWEL OR 17X24 6PK STRL BLUE (TOWEL DISPOSABLE) ×2 IMPLANT
UNDERPAD 30X30 INCONTINENT (UNDERPADS AND DIAPERS) IMPLANT

## 2014-08-06 NOTE — Op Note (Signed)
[name], [name]                ACCOUNT NO.:  [id number]  MEDICAL RECORD NO.:  [id number]  LOCATION:                                 FACILITY:  PHYSICIAN:  Leanora Cover, MD             DATE OF BIRTH:  DATE OF PROCEDURE:  08/06/2014 DATE OF DISCHARGE:                              OPERATIVE REPORT   PREOPERATIVE DIAGNOSIS:  Left wrist volar ganglion cyst.  POSTOPERATIVE DIAGNOSIS:  Left wrist volar ganglion cyst.  PROCEDURE:  Excision of left wrist volar ganglion cyst.  SURGEON:  Leanora Cover, MD  ASSISTANT:  Daryll Brod, MD.  ANESTHESIA:  General.  IV FLUIDS:  Per Anesthesia flow sheet.  ESTIMATED BLOOD LOSS:  Minimal.  COMPLICATIONS:  None.  SPECIMENS:  Left wrist volar ganglion to pathology.  DISPOSITION:  Stable to PACU.  TOURNIQUET TIME:  25 minutes.  INDICATIONS:  The patient is a 48 year old female, who has had a mass in the volar aspect of her left wrist for approximately 2 years.  It is bothersome to her.  She wished to have it removed.  Risks, benefits, and alternatives of surgery were discussed including risk of blood loss, infection, damage to nerves, vessels, tendons, ligaments, bone; failure of surgery; need for additional surgery, complications with wound healing, continued pain, and recurrence of cyst.  She voiced understanding of these risks, and elected to proceed.  OPERATIVE COURSE:  After being identified preoperatively by myself, the patient and I agreed upon procedure and site of procedure.  The surgical site was marked.  The risks, benefits, alternatives of surgery were reviewed and she wished to proceed.  Surgical consent had been signed. She was given IV Ancef as preoperative antibiotic prophylaxis.  She was transferred to the operating room and placed in the operating table in supine position.  Left upper extremity on arm board.  General anesthesia was induced by anesthesiologist.  The left upper extremity was prepped and draped in normal  sterile orthopedic fashion.  A surgical pause was performed between surgeons, anesthesia, OR staff, and all were in agreement as to the patient, procedure, and site of procedure.  An incision was made at the volar aspect of the radial side of the wrist over the mass.  It was carried into subcutaneous tissues by spreading technique.  The bipolar cautery was used to obtain hemostasis.  The mass was easily identified.  It was adherent to a superficial vein, which was ligated with bipolar electrocautery.  It was also adherent to the radial artery.  It was carefully freed up from the artery.  The stalk was traced down to the wrist joint.  The cyst was removed and sent to Pathology for examination.  The rent in the capsule was repaired with a 4-0 Vicryl suture in a figure-of-eight fashion.  The wound was copiously irrigated with sterile saline.  It was then closed with 4-0 nylon in a horizontal mattress fashion.  It was then injected with 7 mL of 0.25% plain Marcaine to aid in postoperative analgesia.  It was dressed with sterile Xeroform, 4 x 4's, and wrapped with a Kerlix dressing.  A volar splint was placed  and wrapped with Kerlix and Ace bandage.  Tourniquet was deflated at 25 minutes.  Fingertips were pink with brisk capillary refill after deflation of tourniquet.  Operative drapes were broken down.  The patient was awoken from anesthesia safely.  She was transferred back to stretcher and taken to PACU in stable condition.  I will see her back in the office in 1 week for postoperative followup. Norco 5/325 one to two p.o. q.6 hours p.r.n. pain, dispensed #30.     Leanora Cover, MD     KK/MEDQ  D:  08/06/2014  T:  08/06/2014  Job:  [id number]

## 2014-08-06 NOTE — Anesthesia Preprocedure Evaluation (Signed)
Anesthesia Evaluation  Patient identified by MRN, date of birth, ID band Patient awake    Reviewed: Allergy & Precautions, H&P , NPO status , Patient's Chart, lab work & pertinent test results  Airway Mallampati: II  TM Distance: >3 FB Neck ROM: Full    Dental  (+) Teeth Intact, Dental Advisory Given   Pulmonary Current Smoker,  breath sounds clear to auscultation        Cardiovascular Rhythm:Regular Rate:Normal     Neuro/Psych    GI/Hepatic   Endo/Other    Renal/GU      Musculoskeletal   Abdominal   Peds  Hematology   Anesthesia Other Findings ?asthma  Reproductive/Obstetrics                             Anesthesia Physical Anesthesia Plan  ASA: II  Anesthesia Plan: General   Post-op Pain Management:    Induction: Intravenous  Airway Management Planned: LMA  Additional Equipment:   Intra-op Plan:   Post-operative Plan: Extubation in OR  Informed Consent: I have reviewed the patients History and Physical, chart, labs and discussed the procedure including the risks, benefits and alternatives for the proposed anesthesia with the patient or authorized representative who has indicated his/her understanding and acceptance.   Dental advisory given  Plan Discussed with: Anesthesiologist, Surgeon and CRNA  Anesthesia Plan Comments:         Anesthesia Quick Evaluation

## 2014-08-06 NOTE — Anesthesia Postprocedure Evaluation (Signed)
  Anesthesia Post-op Note  Patient: [name]  Procedure(s) Performed: Procedure(s): EXCISION LEFT  WRIST GANGLION (Left)  Patient Location: PACU  Anesthesia Type: General   Level of Consciousness: awake, alert  and oriented  Airway and Oxygen Therapy: Patient Spontanous Breathing  Post-op Pain: none   Post-op Assessment: Post-op Vital signs reviewed  Post-op Vital Signs: Reviewed  Last Vitals:  Filed Vitals:   08/06/14 1025  BP: 127/68  Pulse: 70  Temp: 36.4 C  Resp: 16    Complications: No apparent anesthesia complications

## 2014-08-06 NOTE — Discharge Instructions (Addendum)

## 2014-08-06 NOTE — H&P (Signed)
  [name] is an 48 y.o. female.   Chief Complaint: left wrist ganglion HPI: 48 yo rhd female with mass volar left wrist x 2 years.  It is bothersome to her.  She wishes to have it removed.    Past Medical History  Diagnosis Date  . Basal cell carcinoma     face  . History of bronchitis     Past Surgical History  Procedure Laterality Date  . Knee surgery      right x3  . Basal cell carcinoma excision      face  . Tooth extraction      Family History  Problem Relation Age of Onset  . Hypertension Mother   . Heart disease Father   . Arthritis Sister     rheumatoid  . Cancer Brother     prostate   Social History:  reports that she has been smoking Cigarettes.  She has been smoking about 1.00 pack per day. She has never used smokeless tobacco. She reports that she does not drink alcohol or use illicit drugs.  Allergies: No Known Allergies  Medications Prior to Admission  Medication Sig Dispense Refill  . albuterol (PROVENTIL HFA;VENTOLIN HFA) 108 (90 BASE) MCG/ACT inhaler Inhale 2 puffs into the lungs every 6 (six) hours as needed for wheezing or shortness of breath. 1 Inhaler 0    No results found for this or any previous visit (from the past 48 hour(s)).  No results found.   A comprehensive review of systems was negative.  Blood pressure 142/79, pulse 79, temperature 98.2 F (36.8 C), temperature source Oral, resp. rate 20, height 5\' 8"  (1.727 m), weight 70.875 kg (156 lb 4 oz), last menstrual period 07/25/2014, SpO2 100 %.  General appearance: alert, cooperative and appears stated age Head: Normocephalic, without obvious abnormality, atraumatic Neck: supple, symmetrical, trachea midline Resp: clear to auscultation bilaterally Cardio: regular rate and rhythm GI: non tender Extremities: intact sensation and capillary refill all digits.  +epl/fpl/io.  no wounds.  no skin changes. Pulses: 2+ and symmetric Skin: Skin color, texture, turgor normal. No rashes or  lesions Neurologic: Grossly normal Incision/Wound: none  Assessment/Plan Left wrist volar ganglion cyst.  Non operative and operative treatment options were discussed with the patient and patient wishes to proceed with operative treatment. Risks, benefits, and alternatives of surgery were discussed and the patient agrees with the plan of care.   Aison Malveaux R 08/06/2014, 8:25 AM

## 2014-08-06 NOTE — Anesthesia Procedure Notes (Signed)
Procedure Name: LMA Insertion Date/Time: 08/06/2014 8:37 AM Performed by: Lyndee Leo Pre-anesthesia Checklist: Patient identified, Emergency Drugs available, Suction available and Patient being monitored Patient Re-evaluated:Patient Re-evaluated prior to inductionOxygen Delivery Method: Circle System Utilized Preoxygenation: Pre-oxygenation with 100% oxygen Intubation Type: IV induction Ventilation: Mask ventilation without difficulty LMA: LMA inserted LMA Size: 4.0 Number of attempts: 1 Airway Equipment and Method: bite block Placement Confirmation: positive ETCO2 Tube secured with: Tape Dental Injury: Teeth and Oropharynx as per pre-operative assessment

## 2014-08-06 NOTE — Op Note (Signed)
[id number] 

## 2014-08-06 NOTE — Transfer of Care (Signed)
Immediate Anesthesia Transfer of Care Note  Patient: [name]  Procedure(s) Performed: Procedure(s): EXCISION LEFT  WRIST GANGLION (Left)  Patient Location: PACU  Anesthesia Type:General  Level of Consciousness: awake, sedated and patient cooperative  Airway & Oxygen Therapy: Patient Spontanous Breathing and Patient connected to face mask oxygen  Post-op Assessment: Report given to PACU RN and Post -op Vital signs reviewed and stable  Post vital signs: Reviewed and stable  Complications: No apparent anesthesia complications

## 2014-08-06 NOTE — Brief Op Note (Signed)
08/06/2014  9:16 AM  PATIENT:  [name]  48 y.o. female  PRE-OPERATIVE DIAGNOSIS:  LEFT WRIST VOLAR GANGLION  POST-OPERATIVE DIAGNOSIS:  LEFT WRIST VOLAR GANGLION  PROCEDURE:  Procedure(s): EXCISION LEFT  WRIST GANGLION (Left)  SURGEON:  Surgeon(s) and Role:    * Leanora Cover, MD - Primary    * Daryll Brod, MD - Assisting  PHYSICIAN ASSISTANT:   ASSISTANTS: Daryll Brod, MD   ANESTHESIA:   general  EBL:     BLOOD ADMINISTERED:none  DRAINS: none   LOCAL MEDICATIONS USED:  MARCAINE     SPECIMEN:  Source of Specimen:  left wrist  DISPOSITION OF SPECIMEN:  PATHOLOGY  COUNTS:  YES  TOURNIQUET:   Total Tourniquet Time Documented: Upper Arm (Left) - 25 minutes Total: Upper Arm (Left) - 25 minutes   DICTATION: .Other Dictation: Dictation Number [id number]  PLAN OF CARE: Discharge to home after PACU  PATIENT DISPOSITION:  PACU - hemodynamically stable.

## 2014-08-07 ENCOUNTER — Encounter (HOSPITAL_BASED_OUTPATIENT_CLINIC_OR_DEPARTMENT_OTHER): Payer: Self-pay | Admitting: Orthopedic Surgery

## 2015-01-28 ENCOUNTER — Encounter: Payer: Self-pay | Admitting: Nurse Practitioner

## 2015-02-24 ENCOUNTER — Ambulatory Visit: Payer: Self-pay | Admitting: Family

## 2015-03-07 ENCOUNTER — Ambulatory Visit (INDEPENDENT_AMBULATORY_CARE_PROVIDER_SITE_OTHER): Payer: BLUE CROSS/BLUE SHIELD | Admitting: Family

## 2015-03-07 ENCOUNTER — Encounter: Payer: Self-pay | Admitting: Family

## 2015-03-07 VITALS — BP 135/83 | HR 89 | Temp 97.7°F | Ht 68.0 in | Wt 158.6 lb

## 2015-03-07 DIAGNOSIS — I459 Conduction disorder, unspecified: Secondary | ICD-10-CM | POA: Diagnosis not present

## 2015-03-07 DIAGNOSIS — R9431 Abnormal electrocardiogram [ECG] [EKG]: Secondary | ICD-10-CM

## 2015-03-07 DIAGNOSIS — F411 Generalized anxiety disorder: Secondary | ICD-10-CM

## 2015-03-07 MED ORDER — ESCITALOPRAM OXALATE 10 MG PO TABS: 10.0000 mg | ORAL_TABLET | Freq: Every day | ORAL | Status: DC

## 2015-03-07 NOTE — Patient Instructions (Addendum)
Palpitations A palpitation is the feeling that your heartbeat is irregular or is faster than normal. It may feel like your heart is fluttering or skipping a beat. Palpitations are usually not a serious problem. However, in some cases, you may need further medical evaluation. CAUSES  Palpitations can be caused by:  Smoking.  Caffeine or other stimulants, such as diet pills or energy drinks.  Alcohol.  Stress and anxiety.  Strenuous physical activity.  Fatigue.  Certain medicines.  Heart disease, especially if you have a history of irregular heart rhythms (arrhythmias), such as atrial fibrillation, atrial flutter, or supraventricular tachycardia.  An improperly working pacemaker or defibrillator. DIAGNOSIS  To find the cause of your palpitations, your health care provider will take your medical history and perform a physical exam. Your health care provider may also have you take a test called an ambulatory electrocardiogram (ECG). An ECG records your heartbeat patterns over a 24-hour period. You may also have other tests, such as:  Transthoracic echocardiogram (TTE). During echocardiography, sound waves are used to evaluate how blood flows through your heart.  Transesophageal echocardiogram (TEE).  Cardiac monitoring. This allows your health care provider to monitor your heart rate and rhythm in real time.  Holter monitor. This is a portable device that records your heartbeat and can help diagnose heart arrhythmias. It allows your health care provider to track your heart activity for several days, if needed.  Stress tests by exercise or by giving medicine that makes the heart beat faster. TREATMENT  Treatment of palpitations depends on the cause of your symptoms and can vary greatly. Most cases of palpitations do not require any treatment other than time, relaxation, and monitoring your symptoms. Other causes, such as atrial fibrillation, atrial flutter, or supraventricular  tachycardia, usually require further treatment. HOME CARE INSTRUCTIONS   Avoid:  Caffeinated coffee, tea, soft drinks, diet pills, and energy drinks.  Chocolate.  Alcohol.  Stop smoking if you smoke.  Reduce your stress and anxiety. Things that can help you relax include:  A method of controlling things in your body, such as your heartbeats, with your mind (biofeedback).  Yoga.  Meditation.  Physical activity such as swimming, jogging, or walking.  Get plenty of rest and sleep. SEEK MEDICAL CARE IF:   You continue to have a fast or irregular heartbeat beyond 24 hours.  Your palpitations occur more often. SEEK IMMEDIATE MEDICAL CARE IF:  You have chest pain or shortness of breath.  You have a severe headache.  You feel dizzy or you faint. MAKE SURE YOU:  Understand these instructions.  Will watch your condition.  Will get help right away if you are not doing well or get worse. Document Released: 07/30/2000 Document Revised: 08/07/2013 Document Reviewed: 10/01/2011 [employer] Patient Information 2015 [employer]. This information is not intended to replace advice given to you by your health care provider. Make sure you discuss any questions you have with your health care provider.  Generalized Anxiety Disorder Generalized anxiety disorder (GAD) is a mental disorder. It interferes with life functions, including relationships, work, and school. GAD is different from normal anxiety, which everyone experiences at some point in their lives in response to specific life events and activities. Normal anxiety actually helps us prepare for and get through these life events and activities. Normal anxiety goes away after the event or activity is over.  GAD causes anxiety that is not necessarily related to specific events or activities. It also causes excess anxiety in proportion to specific   events or activities. The anxiety associated with GAD is also difficult to control. GAD  can vary from mild to severe. People with severe GAD can have intense waves of anxiety with physical symptoms (panic attacks).  SYMPTOMS The anxiety and worry associated with GAD are difficult to control. This anxiety and worry are related to many life events and activities and also occur more days than not for 6 months or longer. People with GAD also have three or more of the following symptoms (one or more in children):  Restlessness.   Fatigue.  Difficulty concentrating.   Irritability.  Muscle tension.  Difficulty sleeping or unsatisfying sleep. DIAGNOSIS GAD is diagnosed through an assessment by your health care provider. Your health care provider will ask you questions aboutyour mood,physical symptoms, and events in your life. Your health care provider may ask you about your medical history and use of alcohol or drugs, including prescription medicines. Your health care provider may also do a physical exam and blood tests. Certain medical conditions and the use of certain substances can cause symptoms similar to those associated with GAD. Your health care provider may refer you to a mental health specialist for further evaluation. TREATMENT The following therapies are usually used to treat GAD:   Medication. Antidepressant medication usually is prescribed for long-term daily control. Antianxiety medicines may be added in severe cases, especially when panic attacks occur.   Talk therapy (psychotherapy). Certain types of talk therapy can be helpful in treating GAD by providing support, education, and guidance. A form of talk therapy called cognitive behavioral therapy can teach you healthy ways to think about and react to daily life events and activities.  Stress managementtechniques. These include yoga, meditation, and exercise and can be very helpful when they are practiced regularly. A mental health specialist can help determine which treatment is best for you. Some people see  improvement with one therapy. However, other people require a combination of therapies. Document Released: 11/27/2012 Document Revised: 12/17/2013 Document Reviewed: 11/27/2012 [employer] Patient Information 2015 [employer]. This information is not intended to replace advice given to you by your health care provider. Make sure you discuss any questions you have with your health care provider.  

## 2015-03-07 NOTE — Progress Notes (Signed)
   Subjective:    Patient ID: [name], female    DOB: 1965/11/09, 49 y.o.   MRN: [id number]  HPI Pt presents to the office today for palpitations. Pt states she had noticed them on Wednesday while sitting on her couch. PT states she did have "regular soda". Pt states she has had two episodes  of palpitations today. Pt states he has been under a "lot of stress over the last couple of  Months". Pt denies any history of GAD in the past.  Pt denies SOB, chest pains, or fatigue.    Review of Systems  Constitutional: Negative.   HENT: Negative.   Eyes: Negative.   Respiratory: Negative.  Negative for shortness of breath.   Cardiovascular: Negative.  Negative for palpitations.  Gastrointestinal: Negative.   Endocrine: Negative.   Genitourinary: Negative.   Musculoskeletal: Negative.   Neurological: Negative.  Negative for headaches.  Hematological: Negative.   Psychiatric/Behavioral: Negative.   All other systems reviewed and are negative.      Objective:   Physical Exam  Constitutional: She is oriented to person, place, and time. She appears well-developed and well-nourished. No distress.  HENT:  Head: Normocephalic and atraumatic.  Right Ear: External ear normal.  Left Ear: External ear normal.  Nose: Nose normal.  Mouth/Throat: Oropharynx is clear and moist.  Eyes: Pupils are equal, round, and reactive to light.  Neck: Normal range of motion. Neck supple. No thyromegaly present.  Cardiovascular: Normal rate, regular rhythm, normal heart sounds and intact distal pulses.   No murmur heard. Pulmonary/Chest: Effort normal and breath sounds normal. No respiratory distress. She has no wheezes.  Abdominal: Soft. Bowel sounds are normal. She exhibits no distension. There is no tenderness.  Musculoskeletal: Normal range of motion. She exhibits no edema or tenderness.  Neurological: She is alert and oriented to person, place, and time. She has normal reflexes. No cranial nerve  deficit.  Skin: Skin is warm and dry.  Psychiatric: She has a normal mood and affect. Her behavior is normal. Judgment and thought content normal.  Vitals reviewed.   BP 135/83 mmHg  Pulse 89  Temp(Src) 97.7 F (36.5 C) (Oral)  Ht 5\' 8"  (1.727 m)  Wt 158 lb 9.6 oz (71.94 kg)  BMI 24.12 kg/m2  EKG- -Poor R-wave progression -nonspecific -consider old anterior infarct,  Nonspecific T-abnormality.      Assessment & Plan:  1. Skipped heart beats - EKG 12-Lead - Ambulatory referral to Cardiology  2. Nonspecific abnormal electrocardiogram (ECG) (EKG) - Ambulatory referral to Cardiology  3. GAD (generalized anxiety disorder) -Stress management -Smoking cessation discussed -RTO in 1 month - escitalopram (LEXAPRO) 10 MG tablet; Take 1 tablet (10 mg total) by mouth daily.  Dispense: 90 tablet; Refill: 3  *Pt told if she has any chest pain or discomfort or SOB to go to ED Evelina Dun, FNP

## 2015-03-24 ENCOUNTER — Ambulatory Visit (INDEPENDENT_AMBULATORY_CARE_PROVIDER_SITE_OTHER): Payer: BLUE CROSS/BLUE SHIELD | Admitting: Family

## 2015-03-24 ENCOUNTER — Encounter: Payer: Self-pay | Admitting: Family

## 2015-03-24 VITALS — BP 138/82 | HR 79 | Temp 97.5°F | Ht 68.0 in | Wt 161.2 lb

## 2015-03-24 DIAGNOSIS — E1165 Type 2 diabetes mellitus with hyperglycemia: Secondary | ICD-10-CM

## 2015-03-24 DIAGNOSIS — Z720 Tobacco use: Secondary | ICD-10-CM | POA: Diagnosis not present

## 2015-03-24 DIAGNOSIS — E119 Type 2 diabetes mellitus without complications: Secondary | ICD-10-CM | POA: Insufficient documentation

## 2015-03-24 LAB — POCT GLYCOSYLATED HEMOGLOBIN (HGB A1C): Hemoglobin A1C: 6

## 2015-03-24 LAB — POCT UA - MICROALBUMIN: Microalbumin Ur, POC: NEGATIVE mg/L

## 2015-03-24 MED ORDER — METFORMIN HCL ER 500 MG PO TB24: 500.0000 mg | ORAL_TABLET | Freq: Every day | ORAL | Status: DC

## 2015-03-24 NOTE — Patient Instructions (Signed)
Type 2 Diabetes Mellitus Type 2 diabetes mellitus, often simply referred to as type 2 diabetes, is a long-lasting (chronic) disease. In type 2 diabetes, the pancreas does not make enough insulin (a hormone), the cells are less responsive to the insulin that is made (insulin resistance), or both. Normally, insulin moves sugars from food into the tissue cells. The tissue cells use the sugars for energy. The lack of insulin or the lack of normal response to insulin causes excess sugars to build up in the blood instead of going into the tissue cells. As a result, high blood sugar (hyperglycemia) develops. The effect of high sugar (glucose) levels can cause many complications. Type 2 diabetes was also previously called adult-onset diabetes, but it can occur at any age.  RISK FACTORS  A person is predisposed to developing type 2 diabetes if someone in the family has the disease and also has one or more of the following primary risk factors:  Overweight.  An inactive lifestyle.  A history of consistently eating high-calorie foods. Maintaining a normal weight and regular physical activity can reduce the chance of developing type 2 diabetes. SYMPTOMS  A person with type 2 diabetes may not show symptoms initially. The symptoms of type 2 diabetes appear slowly. The symptoms include:  Increased thirst (polydipsia).  Increased urination (polyuria).  Increased urination during the night (nocturia).  Weight loss. This weight loss may be rapid.  Frequent, recurring infections.  Tiredness (fatigue).  Weakness.  Vision changes, such as blurred vision.  Fruity smell to your breath.  Abdominal pain.  Nausea or vomiting.  Cuts or bruises which are slow to heal.  Tingling or numbness in the hands or feet. DIAGNOSIS Type 2 diabetes is frequently not diagnosed until complications of diabetes are present. Type 2 diabetes is diagnosed when symptoms or complications are present and when blood  glucose levels are increased. Your blood glucose level may be checked by one or more of the following blood tests:  A fasting blood glucose test. You will not be allowed to eat for at least 8 hours before a blood sample is taken.  A random blood glucose test. Your blood glucose is checked at any time of the day regardless of when you ate.  A hemoglobin A1c blood glucose test. A hemoglobin A1c test provides information about blood glucose control over the previous 3 months.  An oral glucose tolerance test (OGTT). Your blood glucose is measured after you have not eaten (fasted) for 2 hours and then after you drink a glucose-containing beverage. TREATMENT   You may need to take insulin or diabetes medicine daily to keep blood glucose levels in the desired range.  If you use insulin, you may need to adjust the dosage depending on the carbohydrates that you eat with each meal or snack. The treatment goal is to maintain the before meal blood sugar (preprandial glucose) level at 70-130 mg/dL. HOME CARE INSTRUCTIONS   Have your hemoglobin A1c level checked twice a year.  Perform daily blood glucose monitoring as directed by your health care provider.  Monitor urine ketones when you are ill and as directed by your health care provider.  Take your diabetes medicine or insulin as directed by your health care provider to maintain your blood glucose levels in the desired range.  Never run out of diabetes medicine or insulin. It is needed every day.  If you are using insulin, you may need to adjust the amount of insulin given based on your intake of   carbohydrates. Carbohydrates can raise blood glucose levels but need to be included in your diet. Carbohydrates provide vitamins, minerals, and fiber which are an essential part of a healthy diet. Carbohydrates are found in fruits, vegetables, whole grains, dairy products, legumes, and foods containing added sugars.  Eat healthy foods. You should make an  appointment to see a registered dietitian to help you create an eating plan that is right for you.  Lose weight if you are overweight.  Carry a medical alert card or wear your medical alert jewelry.  Carry a 15-gram carbohydrate snack with you at all times to treat low blood glucose (hypoglycemia). Some examples of 15-gram carbohydrate snacks include:  Glucose tablets, 3 or 4.  Glucose gel, 15-gram tube.  Raisins, 2 tablespoons (24 grams).  Jelly beans, 6.  Animal crackers, 8.  Regular pop, 4 ounces (120 mL).  Gummy treats, 9.  Recognize hypoglycemia. Hypoglycemia occurs with blood glucose levels of 70 mg/dL and below. The risk for hypoglycemia increases when fasting or skipping meals, during or after intense exercise, and during sleep. Hypoglycemia symptoms can include:  Tremors or shakes.  Decreased ability to concentrate.  Sweating.  Increased heart rate.  Headache.  Dry mouth.  Hunger.  Irritability.  Anxiety.  Restless sleep.  Altered speech or coordination.  Confusion.  Treat hypoglycemia promptly. If you are alert and able to safely swallow, follow the 15:15 rule:  Take 15-20 grams of rapid-acting glucose or carbohydrate. Rapid-acting options include glucose gel, glucose tablets, or 4 ounces (120 mL) of fruit juice, regular soda, or low-fat milk.  Check your blood glucose level 15 minutes after taking the glucose.  Take 15-20 grams more of glucose if the repeat blood glucose level is still 70 mg/dL or below.  Eat a meal or snack within 1 hour once blood glucose levels return to normal.  Be alert to feeling very thirsty and urinating more frequently than usual, which are early signs of hyperglycemia. An early awareness of hyperglycemia allows for prompt treatment. Treat hyperglycemia as directed by your health care provider.  Engage in at least 150 minutes of moderate-intensity physical activity a week, spread over at least 3 days of the week or as  directed by your health care provider. In addition, you should engage in resistance exercise at least 2 times a week or as directed by your health care provider. Try to spend no more than 90 minutes at one time inactive.  Adjust your medicine and food intake as needed if you start a new exercise or sport.  Follow your sick-day plan anytime you are unable to eat or drink as usual.  Do not use any tobacco products including cigarettes, chewing tobacco, or electronic cigarettes. If you need help quitting, ask your health care provider.  Limit alcohol intake to no more than 1 drink per day for nonpregnant women and 2 drinks per day for men. You should drink alcohol only when you are also eating food. Talk with your health care provider whether alcohol is safe for you. Tell your health care provider if you drink alcohol several times a week.  Keep all follow-up visits as directed by your health care provider. This is important.  Schedule an eye exam soon after the diagnosis of type 2 diabetes and then annually.  Perform daily skin and foot care. Examine your skin and feet daily for cuts, bruises, redness, nail problems, bleeding, blisters, or sores. A foot exam by a health care provider should be done annually.    Brush your teeth and gums at least twice a day and floss at least once a day. Follow up with your dentist regularly.  Share your diabetes management plan with your workplace or school.  Stay up-to-date with immunizations. It is recommended that people with diabetes who are over 57 years old get the pneumonia vaccine. In some cases, two separate shots may be given. Ask your health care provider if your pneumonia vaccination is up-to-date.  Learn to manage stress.  Obtain ongoing diabetes education and support as needed.  Participate in or seek rehabilitation as needed to maintain or improve independence and quality of life. Request a physical or occupational therapy referral if you are  having foot or hand numbness, or difficulties with grooming, dressing, eating, or physical activity. SEEK MEDICAL CARE IF:   You are unable to eat food or drink fluids for more than 6 hours.  You have nausea and vomiting for more than 6 hours.  Your blood glucose level is over 240 mg/dL.  There is a change in mental status.  You develop an additional serious illness.  You have diarrhea for more than 6 hours.  You have been sick or have had a fever for a couple of days and are not getting better.  You have pain during any physical activity.  SEEK IMMEDIATE MEDICAL CARE IF:  You have difficulty breathing.  You have moderate to large ketone levels. MAKE SURE YOU:  Understand these instructions.  Will watch your condition.  Will get help right away if you are not doing well or get worse. Document Released: 08/02/2005 Document Revised: 12/17/2013 Document Reviewed: 02/29/2012 [facility] Patient Information 2015 [place], [place]. This information is not intended to replace advice given to you by your health care provider. Make sure you discuss any questions you have with your health care provider. Diabetes Mellitus and Food It is important for you to manage your blood sugar (glucose) level. Your blood glucose level can be greatly affected by what you eat. Eating healthier foods in the appropriate amounts throughout the day at about the same time each day will help you control your blood glucose level. It can also help slow or prevent worsening of your diabetes mellitus. Healthy eating may even help you improve the level of your blood pressure and reach or maintain a healthy weight.  HOW CAN FOOD AFFECT ME? Carbohydrates Carbohydrates affect your blood glucose level more than any other type of food. Your dietitian will help you determine how many carbohydrates to eat at each meal and teach you how to count carbohydrates. Counting carbohydrates is important to keep your blood glucose at a  healthy level, especially if you are using insulin or taking certain medicines for diabetes mellitus. Alcohol Alcohol can cause sudden decreases in blood glucose (hypoglycemia), especially if you use insulin or take certain medicines for diabetes mellitus. Hypoglycemia can be a life-threatening condition. Symptoms of hypoglycemia (sleepiness, dizziness, and disorientation) are similar to symptoms of having too much alcohol.  If your health care provider has given you approval to drink alcohol, do so in moderation and use the following guidelines:  Women should not have more than one drink per day, and men should not have more than two drinks per day. One drink is equal to:  12 oz of beer.  5 oz of wine.  1 oz of hard liquor.  Do not drink on an empty stomach.  Keep yourself hydrated. Have water, diet soda, or unsweetened iced tea.  Regular soda, juice,  and other mixers might contain a lot of carbohydrates and should be counted. WHAT FOODS ARE NOT RECOMMENDED? As you make food choices, it is important to remember that all foods are not the same. Some foods have fewer nutrients per serving than other foods, even though they might have the same number of calories or carbohydrates. It is difficult to get your body what it needs when you eat foods with fewer nutrients. Examples of foods that you should avoid that are high in calories and carbohydrates but low in nutrients include:  Trans fats (most processed foods list trans fats on the Nutrition Facts label).  Regular soda.  Juice.  Candy.  Sweets, such as cake, pie, doughnuts, and cookies.  Fried foods. WHAT FOODS CAN I EAT? Have nutrient-rich foods, which will nourish your body and keep you healthy. The food you should eat also will depend on several factors, including:  The calories you need.  The medicines you take.  Your weight.  Your blood glucose level.  Your blood pressure level.  Your cholesterol level. You also  should eat a variety of foods, including:  Protein, such as meat, poultry, fish, tofu, nuts, and seeds (lean animal proteins are best).  Fruits.  Vegetables.  Dairy products, such as milk, cheese, and yogurt (low fat is best).  Breads, grains, pasta, cereal, rice, and beans.  Fats such as olive oil, trans fat-free margarine, canola oil, avocado, and olives. DOES EVERYONE WITH DIABETES MELLITUS HAVE THE SAME MEAL PLAN? Because every person with diabetes mellitus is different, there is not one meal plan that works for everyone. It is very important that you meet with a dietitian who will help you create a meal plan that is just right for you. Document Released: 04/29/2005 Document Revised: 08/07/2013 Document Reviewed: 06/29/2013 [facility] Patient Information 2015 [place], [place]. This information is not intended to replace advice given to you by your health care provider. Make sure you discuss any questions you have with your health care provider.

## 2015-03-24 NOTE — Progress Notes (Signed)
Subjective:    Patient ID: [name], female    DOB: [birth year], 49 y.o.   MRN: [id number]  Pt presents to the office today to discuss her becoming a new diabetic.Pt's lab work was checked at work and she had a HgbA1C 6.7.  Pt states she is emotional today because her sister-in-law passed away last night unexpected from a MI. Pt states she has been at the hospital all night.  Diabetes She presents for her initial diabetic visit. She has type 2 diabetes mellitus. There are no hypoglycemic associated symptoms. Pertinent negatives for hypoglycemia include no headaches. There are no diabetic associated symptoms. Pertinent negatives for diabetes include no blurred vision, no foot paresthesias, no foot ulcerations and no visual change. There are no hypoglycemic complications. Symptoms are stable. There are no diabetic complications. Risk factors for coronary artery disease include family history and hypertension. Current diabetic treatment includes oral agent (monotherapy). She is compliant with treatment all of the time. She is following a generally healthy diet. Her breakfast blood glucose range is generally 110-130 mg/dl. An ACE inhibitor/angiotensin II receptor blocker is not being taken. Eye exam is not current.      Review of Systems  Constitutional: Negative.   HENT: Negative.   Eyes: Negative.  Negative for blurred vision.  Respiratory: Negative.  Negative for shortness of breath.   Cardiovascular: Negative.  Negative for palpitations.  Gastrointestinal: Negative.   Endocrine: Negative.   Genitourinary: Negative.   Musculoskeletal: Negative.   Neurological: Negative.  Negative for headaches.  Hematological: Negative.   Psychiatric/Behavioral: Negative.   All other systems reviewed and are negative.      Objective:   Physical Exam  Constitutional: She is oriented to person, place, and time. She appears well-developed and well-nourished. No distress.  HENT:  Head: Normocephalic  and atraumatic.  Right Ear: External ear normal.  Left Ear: External ear normal.  Nose: Nose normal.  Mouth/Throat: Oropharynx is clear and moist.  Eyes: Pupils are equal, round, and reactive to light.  Neck: Normal range of motion. Neck supple. No thyromegaly present.  Cardiovascular: Normal rate, regular rhythm, normal heart sounds and intact distal pulses.   No murmur heard. Pulmonary/Chest: Effort normal and breath sounds normal. No respiratory distress. She has no wheezes.  Abdominal: Soft. Bowel sounds are normal. She exhibits no distension. There is no tenderness.  Musculoskeletal: Normal range of motion. She exhibits no edema or tenderness.  Neurological: She is alert and oriented to person, place, and time. She has normal reflexes. No cranial nerve deficit.  Skin: Skin is warm and dry.  Psychiatric: She has a normal mood and affect. Her behavior is normal. Judgment and thought content normal.  Vitals reviewed.   BP 147/82 mmHg  Pulse 86  Temp(Src) 97.5 F (36.4 C) (Oral)  Ht _0  (1.727 m)  Wt 161 lb 3.2 oz (73.12 kg)  BMI 24.52 kg/m2       Assessment & Plan:  1. Tobacco user -Smoking cessation - CMP14+EGFR  2. Type 2 diabetes mellitus with hyperglycemia -Low carb diet discussed -Gave pt a blood glucose monitor and showed how to use -Spent 20 mins or greater in face to face education time - POCT glycosylated hemoglobin (Hb A1C) - POCT UA - Microalbumin - CMP14+EGFR - metFORMIN (GLUCOPHAGE-XR) 500 MG 24 hr tablet; Take 1 tablet (500 mg total) by mouth daily.  Dispense: 90 tablet; Refill: 1   Continue all meds Labs pending Health Maintenance reviewed Diet and exercise encouraged RTO 3  months  Evelina Dun, FNP

## 2015-03-25 LAB — CMP14+EGFR
A/G RATIO: 1.6 (ref 1.1–2.5)
ALBUMIN: 4.4 g/dL (ref 3.5–5.5)
ALT: 14 IU/L (ref 0–32)
AST: 13 IU/L (ref 0–40)
Alkaline Phosphatase: 116 IU/L (ref 39–117)
BILIRUBIN TOTAL: 0.9 mg/dL (ref 0.0–1.2)
BUN / CREAT RATIO: 10 (ref 9–23)
BUN: 7 mg/dL (ref 6–24)
CO2: 25 mmol/L (ref 18–29)
CREATININE: 0.7 mg/dL (ref 0.57–1.00)
Calcium: 9.5 mg/dL (ref 8.7–10.2)
Chloride: 101 mmol/L (ref 97–108)
GFR calc Af Amer: 118 mL/min/{1.73_m2} (ref >59)
GFR, EST NON AFRICAN AMERICAN: 102 mL/min/{1.73_m2} (ref >59)
Globulin, Total: 2.8 g/dL (ref 1.5–4.5)
Glucose: 116 mg/dL — ABNORMAL HIGH (ref 65–99)
Potassium: 4.1 mmol/L (ref 3.5–5.2)
Sodium: 143 mmol/L (ref 134–144)
Total Protein: 7.2 g/dL (ref 6.0–8.5)

## 2015-03-25 NOTE — Progress Notes (Signed)
453# states it is not accepting calls at this time

## 2015-04-08 ENCOUNTER — Ambulatory Visit: Payer: BLUE CROSS/BLUE SHIELD | Admitting: Family

## 2015-04-11 ENCOUNTER — Ambulatory Visit (INDEPENDENT_AMBULATORY_CARE_PROVIDER_SITE_OTHER): Payer: BLUE CROSS/BLUE SHIELD | Admitting: Nurse Practitioner

## 2015-04-11 VITALS — BP 148/88 | HR 81 | Temp 96.9°F | Ht 68.0 in | Wt 159.0 lb

## 2015-04-11 DIAGNOSIS — E1165 Type 2 diabetes mellitus with hyperglycemia: Secondary | ICD-10-CM | POA: Diagnosis not present

## 2015-04-11 DIAGNOSIS — E162 Hypoglycemia, unspecified: Secondary | ICD-10-CM

## 2015-04-11 DIAGNOSIS — I10 Essential (primary) hypertension: Secondary | ICD-10-CM | POA: Diagnosis not present

## 2015-04-11 MED ORDER — METFORMIN HCL ER 500 MG PO TB24: ORAL_TABLET | ORAL | Status: DC

## 2015-04-11 NOTE — Patient Instructions (Signed)
Hypoglycemia °Hypoglycemia occurs when the glucose in your blood is too low. Glucose is a type of sugar that is your body's main energy source. Hormones, such as insulin and glucagon, control the level of glucose in the blood. Insulin lowers blood glucose and glucagon increases blood glucose. Having too much insulin in your blood stream, or not eating enough food containing sugar, can result in hypoglycemia. Hypoglycemia can happen to people with or without diabetes. It can develop quickly and can be a medical emergency.  °CAUSES  °· Missing or delaying meals. °· Not eating enough carbohydrates at meals. °· Taking too much diabetes medicine. °· Not timing your oral diabetes medicine or insulin doses with meals, snacks, and exercise. °· Nausea and vomiting. °· Certain medicines. °· Severe illnesses, such as hepatitis, kidney disorders, and certain eating disorders. °· Increased activity or exercise without eating something extra or adjusting medicines. °· Drinking too much alcohol. °· A nerve disorder that affects body functions like your heart rate, blood pressure, and digestion (autonomic neuropathy). °· A condition where the stomach muscles do not function properly (gastroparesis). Therefore, medicines and food may not absorb properly. °· Rarely, a tumor of the pancreas can produce too much insulin. °SYMPTOMS  °· Hunger. °· Sweating (diaphoresis). °· Change in body temperature. °· Shakiness. °· Headache. °· Anxiety. °· Lightheadedness. °· Irritability. °· Difficulty concentrating. °· Dry mouth. °· Tingling or numbness in the hands or feet. °· Restless sleep or sleep disturbances. °· Altered speech and coordination. °· Change in mental status. °· Seizures or prolonged convulsions. °· Combativeness. °· Drowsiness (lethargic). °· Weakness. °· Increased heart rate or palpitations. °· Confusion. °· Pale, gray skin color. °· Blurred or double vision. °· Fainting. °DIAGNOSIS  °A physical exam and medical history will be  performed. Your caregiver may make a diagnosis based on your symptoms. Blood tests and other lab tests may be performed to confirm a diagnosis. Once the diagnosis is made, your caregiver will see if your signs and symptoms go away once your blood glucose is raised.  °TREATMENT  °Usually, you can easily treat your hypoglycemia when you notice symptoms. °· Check your blood glucose. If it is less than 70 mg/dl, take one of the following:   °¨ 3-4 glucose tablets.   °¨ ½ cup juice.   °¨ ½ cup regular soda.   °¨ 1 cup skim milk.   °¨ ½-1 tube of glucose gel.   °¨ 5-6 hard candies.   °· Avoid high-fat drinks or food that may delay a rise in blood glucose levels. °· Do not take more than the recommended amount of sugary foods, drinks, gel, or tablets. Doing so will cause your blood glucose to go too high.   °· Wait 10-15 minutes and recheck your blood glucose. If it is still less than 70 mg/dl or below your target range, repeat treatment.   °· Eat a snack if it is more than 1 hour until your next meal.   °There may be a time when your blood glucose may go so low that you are unable to treat yourself at home when you start to notice symptoms. You may need someone to help you. You may even faint or be unable to swallow. If you cannot treat yourself, someone will need to bring you to the hospital.  °HOME CARE INSTRUCTIONS °· If you have diabetes, follow your diabetes management plan by: °¨ Taking your medicines as directed. °¨ Following your exercise plan. °¨ Following your meal plan. Do not skip meals. Eat on time. °¨ Testing your blood   glucose regularly. Check your blood glucose before and after exercise. If you exercise longer or different than usual, be sure to check blood glucose more frequently. °¨ Wearing your medical alert jewelry that says you have diabetes. °· Identify the cause of your hypoglycemia. Then, develop ways to prevent the recurrence of hypoglycemia. °· Do not take a hot bath or shower right after an  insulin shot. °· Always carry treatment with you. Glucose tablets are the easiest to carry. °· If you are going to drink alcohol, drink it only with meals. °· Tell friends or family members ways to keep you safe during a seizure. This may include removing hard or sharp objects from the area or turning you on your side. °· Maintain a healthy weight. °SEEK MEDICAL CARE IF:  °· You are having problems keeping your blood glucose in your target range. °· You are having frequent episodes of hypoglycemia. °· You feel you might be having side effects from your medicines. °· You are not sure why your blood glucose is dropping so low. °· You notice a change in vision or a new problem with your vision. °SEEK IMMEDIATE MEDICAL CARE IF:  °· Confusion develops. °· A change in mental status occurs. °· The inability to swallow develops. °· Fainting occurs. °Document Released: 08/02/2005 Document Revised: 08/07/2013 Document Reviewed: 11/29/2011 °[employer]® Patient Information ©2015 [employer]. This information is not intended to replace advice given to you by your health care provider. Make sure you discuss any questions you have with your health care provider. ° °

## 2015-04-11 NOTE — Progress Notes (Signed)
   Subjective:    Patient ID: [name], female    DOB: 01/24/1966, 49 y.o.   MRN: [id number]  HPI Patient comes in today c/o headache and high blood pressure. She is also a diabetic- She said she had an episode yesterday where vision got blurry and she broke out into a sweat . She ate a piece of candy and by time nurse at work was able to check her blodd sugar it was 140. Her blood pressure was high but she not sure how high. Now she just c/o headache. Patient said PA at work started her on celexa to see if it would calm her down and she has taken 2 doses.   Review of Systems  Constitutional: Negative.   HENT: Negative.   Eyes: Positive for visual disturbance (slightly blurred).  Respiratory: Negative.   Cardiovascular: Negative.   Gastrointestinal: Negative.   Genitourinary: Negative.   Musculoskeletal: Negative.   Neurological: Negative.   Psychiatric/Behavioral: Negative.   All other systems reviewed and are negative.      Objective:   Physical Exam  Constitutional: She is oriented to person, place, and time. She appears well-developed and well-nourished.  Eyes: EOM are normal. Pupils are equal, round, and reactive to light.  Cardiovascular: Normal rate, regular rhythm and normal heart sounds.   Pulmonary/Chest: Effort normal and breath sounds normal.  Abdominal: Soft. Bowel sounds are normal.  Neurological: She is alert and oriented to person, place, and time.  Skin: Skin is warm and dry.  Psychiatric: She has a normal mood and affect. Her behavior is normal. Judgment and thought content normal.   BP 148/88 mmHg  Pulse 81  Temp(Src) 96.9 F (36.1 C) (Oral)  Ht 5\' 8"  (1.727 m)  Wt 159 lb (72.122 kg)  BMI 24.18 kg/m2         Assessment & Plan:  1. Hypoglycemia 2. Type 2 diabetes mellitus with hyperglycemia decrease metformin to 1/2 tablet daily Keep diary of blood sugars Keep glucometer with you at all times  so can check blood sugars on as needed basis -  metFORMIN (GLUCOPHAGE-XR) 500 MG 24 hr tablet; 1/2 po QD  Dispense: 90 tablet; Refill: 1  3. Essential hypertension Keep diary of blood pressures Avoid caffeine  RTO in 1 week if no better Continue celexa as rx and follow up in 3 weeks with NP at work  [employer], FNP

## 2015-05-30 ENCOUNTER — Ambulatory Visit (INDEPENDENT_AMBULATORY_CARE_PROVIDER_SITE_OTHER): Payer: BLUE CROSS/BLUE SHIELD

## 2015-05-30 ENCOUNTER — Ambulatory Visit (INDEPENDENT_AMBULATORY_CARE_PROVIDER_SITE_OTHER): Payer: BLUE CROSS/BLUE SHIELD | Admitting: Pediatrics

## 2015-05-30 ENCOUNTER — Encounter: Payer: Self-pay | Admitting: Pediatrics

## 2015-05-30 VITALS — BP 133/88 | HR 75 | Temp 97.7°F | Ht 68.0 in | Wt 157.4 lb

## 2015-05-30 DIAGNOSIS — M25561 Pain in right knee: Secondary | ICD-10-CM
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
## 2015-05-30 NOTE — Patient Instructions (Signed)
Take two tylenol 500mg  morning and night Take ibuprofen 600mg  every 6 hours as needed for pain

## 2015-06-24 ENCOUNTER — Ambulatory Visit: Payer: BLUE CROSS/BLUE SHIELD | Admitting: Family

## 2015-07-01 ENCOUNTER — Ambulatory Visit: Payer: BLUE CROSS/BLUE SHIELD | Admitting: Family

## 2015-12-15 ENCOUNTER — Encounter: Payer: Self-pay | Admitting: Family

## 2015-12-15 ENCOUNTER — Ambulatory Visit (INDEPENDENT_AMBULATORY_CARE_PROVIDER_SITE_OTHER): Payer: BLUE CROSS/BLUE SHIELD | Admitting: Family

## 2015-12-15 VITALS — BP 122/76 | HR 86 | Temp 97.2°F | Ht 68.0 in | Wt 161.5 lb

## 2015-12-15 DIAGNOSIS — Z720 Tobacco use: Secondary | ICD-10-CM

## 2015-12-15 DIAGNOSIS — G8929 Other chronic pain: Secondary | ICD-10-CM | POA: Diagnosis not present

## 2015-12-15 DIAGNOSIS — Z1211 Encounter for screening for malignant neoplasm of colon: Secondary | ICD-10-CM | POA: Diagnosis not present

## 2015-12-15 DIAGNOSIS — E1100 Type 2 diabetes mellitus with hyperosmolarity without nonketotic hyperglycemic-hyperosmolar coma (NKHHC): Secondary | ICD-10-CM

## 2015-12-15 DIAGNOSIS — M549 Dorsalgia, unspecified: Secondary | ICD-10-CM | POA: Diagnosis not present

## 2015-12-15 DIAGNOSIS — F411 Generalized anxiety disorder: Secondary | ICD-10-CM | POA: Diagnosis not present

## 2015-12-15 DIAGNOSIS — E119 Type 2 diabetes mellitus without complications: Secondary | ICD-10-CM | POA: Insufficient documentation

## 2015-12-15 LAB — CMP14+EGFR
ALT: 11 IU/L (ref 0–32)
AST: 10 IU/L (ref 0–40)
Albumin/Globulin Ratio: 1.6 (ref 1.2–2.2)
Albumin: 4.1 g/dL (ref 3.5–5.5)
Alkaline Phosphatase: 103 IU/L (ref 39–117)
BUN/Creatinine Ratio: 14 (ref 9–23)
BUN: 11 mg/dL (ref 6–24)
Bilirubin Total: 0.6 mg/dL (ref 0.0–1.2)
CO2: 22 mmol/L (ref 18–29)
Calcium: 9.2 mg/dL (ref 8.7–10.2)
Chloride: 101 mmol/L (ref 96–106)
Creatinine, Ser: 0.76 mg/dL (ref 0.57–1.00)
GFR calc Af Amer: 106 mL/min/{1.73_m2} (ref >59)
GFR calc non Af Amer: 92 mL/min/{1.73_m2} (ref >59)
Globulin, Total: 2.5 g/dL (ref 1.5–4.5)
Glucose: 175 mg/dL — ABNORMAL HIGH (ref 65–99)
Potassium: 4 mmol/L (ref 3.5–5.2)
Sodium: 142 mmol/L (ref 134–144)
Total Protein: 6.6 g/dL (ref 6.0–8.5)

## 2015-12-15 LAB — BAYER DCA HB A1C WAIVED: HB A1C (BAYER DCA - WAIVED): 6.8 % (ref <7.0)

## 2015-12-15 MED ORDER — ESCITALOPRAM OXALATE 10 MG PO TABS: 10.0000 mg | ORAL_TABLET | Freq: Every day | ORAL | Status: DC

## 2015-12-15 NOTE — Patient Instructions (Signed)
Health Maintenance, Female Adopting a healthy lifestyle and getting preventive care can go a long way to promote health and wellness. Talk with your health care provider about what schedule of regular examinations is right for you. This is a good chance for you to check in with your provider about disease prevention and staying healthy. In between checkups, there are plenty of things you can do on your own. Experts have done a lot of research about which lifestyle changes and preventive measures are most likely to keep you healthy. Ask your health care provider for more information. WEIGHT AND DIET  Eat a healthy diet  Be sure to include plenty of vegetables, fruits, low-fat dairy products, and lean protein.  Do not eat a lot of foods high in solid fats, added sugars, or salt.  Get regular exercise. This is one of the most important things you can do for your health.  Most adults should exercise for at least 150 minutes each week. The exercise should increase your heart rate and make you sweat (moderate-intensity exercise).  Most adults should also do strengthening exercises at least twice a week. This is in addition to the moderate-intensity exercise.  Maintain a healthy weight  Body mass index (BMI) is a measurement that can be used to identify possible weight problems. It estimates body fat based on height and weight. Your health care provider can help determine your BMI and help you achieve or maintain a healthy weight.  For females 20 years of age and older:   A BMI below 18.5 is considered underweight.  A BMI of 18.5 to 24.9 is normal.  A BMI of 25 to 29.9 is considered overweight.  A BMI of 30 and above is considered obese.  Watch levels of cholesterol and blood lipids  You should start having your blood tested for lipids and cholesterol at 51 years of age, then have this test every 5 years.  You may need to have your cholesterol levels checked more often if:  Your lipid  or cholesterol levels are high.  You are older than 50 years of age.  You are at high risk for heart disease.  CANCER SCREENING   Lung Cancer  Lung cancer screening is recommended for adults 55-80 years old who are at high risk for lung cancer because of a history of smoking.  A yearly low-dose CT scan of the lungs is recommended for people who:  Currently smoke.  Have quit within the past 15 years.  Have at least a 30-pack-year history of smoking. A pack year is smoking an average of one pack of cigarettes a day for 1 year.  Yearly screening should continue until it has been 15 years since you quit.  Yearly screening should stop if you develop a health problem that would prevent you from having lung cancer treatment.  Breast Cancer  Practice breast self-awareness. This means understanding how your breasts normally appear and feel.  It also means doing regular breast self-exams. Let your health care provider know about any changes, no matter how small.  If you are in your 20s or 30s, you should have a clinical breast exam (CBE) by a health care provider every 1-3 years as part of a regular health exam.  If you are 40 or older, have a CBE every year. Also consider having a breast X-ray (mammogram) every year.  If you have a family history of breast cancer, talk to your health care provider about genetic screening.  If you   are at high risk for breast cancer, talk to your health care provider about having an MRI and a mammogram every year.  Breast cancer gene (BRCA) assessment is recommended for women who have family members with BRCA-related cancers. BRCA-related cancers include:  Breast.  Ovarian.  Tubal.  Peritoneal cancers.  Results of the assessment will determine the need for genetic counseling and BRCA1 and BRCA2 testing. Cervical Cancer Your health care provider may recommend that you be screened regularly for cancer of the pelvic organs (ovaries, uterus, and  vagina). This screening involves a pelvic examination, including checking for microscopic changes to the surface of your cervix (Pap test). You may be encouraged to have this screening done every 3 years, beginning at age 21.  For women ages 30-65, health care providers may recommend pelvic exams and Pap testing every 3 years, or they may recommend the Pap and pelvic exam, combined with testing for human papilloma virus (HPV), every 5 years. Some types of HPV increase your risk of cervical cancer. Testing for HPV may also be done on women of any age with unclear Pap test results.  Other health care providers may not recommend any screening for nonpregnant women who are considered low risk for pelvic cancer and who do not have symptoms. Ask your health care provider if a screening pelvic exam is right for you.  If you have had past treatment for cervical cancer or a condition that could lead to cancer, you need Pap tests and screening for cancer for at least 20 years after your treatment. If Pap tests have been discontinued, your risk factors (such as having a new sexual partner) need to be reassessed to determine if screening should resume. Some women have medical problems that increase the chance of getting cervical cancer. In these cases, your health care provider may recommend more frequent screening and Pap tests. Colorectal Cancer  This type of cancer can be detected and often prevented.  Routine colorectal cancer screening usually begins at 50 years of age and continues through 51 years of age.  Your health care provider may recommend screening at an earlier age if you have risk factors for colon cancer.  Your health care provider may also recommend using home test kits to check for hidden blood in the stool.  A small camera at the end of a tube can be used to examine your colon directly (sigmoidoscopy or colonoscopy). This is done to check for the earliest forms of colorectal  cancer.  Routine screening usually begins at age 50.  Direct examination of the colon should be repeated every 5-10 years through 51 years of age. However, you may need to be screened more often if early forms of precancerous polyps or small growths are found. Skin Cancer  Check your skin from head to toe regularly.  Tell your health care provider about any new moles or changes in moles, especially if there is a change in a mole's shape or color.  Also tell your health care provider if you have a mole that is larger than the size of a pencil eraser.  Always use sunscreen. Apply sunscreen liberally and repeatedly throughout the day.  Protect yourself by wearing long sleeves, pants, a wide-brimmed hat, and sunglasses whenever you are outside. HEART DISEASE, DIABETES, AND HIGH BLOOD PRESSURE   High blood pressure causes heart disease and increases the risk of stroke. High blood pressure is more likely to develop in:  People who have blood pressure in the high end   of the normal range (130-139/85-89 mm Hg).  People who are overweight or obese.  People who are African American.  If you are 38-23 years of age, have your blood pressure checked every 3-5 years. If you are 61 years of age or older, have your blood pressure checked every year. You should have your blood pressure measured twice--once when you are at a hospital or clinic, and once when you are not at a hospital or clinic. Record the average of the two measurements. To check your blood pressure when you are not at a hospital or clinic, you can use:  An automated blood pressure machine at a pharmacy.  A home blood pressure monitor.  If you are between 45 years and 39 years old, ask your health care provider if you should take aspirin to prevent strokes.  Have regular diabetes screenings. This involves taking a blood sample to check your fasting blood sugar level.  If you are at a normal weight and have a low risk for diabetes,  have this test once every three years after 51 years of age.  If you are overweight and have a high risk for diabetes, consider being tested at a younger age or more often. PREVENTING INFECTION  Hepatitis B  If you have a higher risk for hepatitis B, you should be screened for this virus. You are considered at high risk for hepatitis B if:  You were born in a country where hepatitis B is common. Ask your health care provider which countries are considered high risk.  Your parents were born in a high-risk country, and you have not been immunized against hepatitis B (hepatitis B vaccine).  You have HIV or AIDS.  You use needles to inject street drugs.  You live with someone who has hepatitis B.  You have had sex with someone who has hepatitis B.  You get hemodialysis treatment.  You take certain medicines for conditions, including cancer, organ transplantation, and autoimmune conditions. Hepatitis C  Blood testing is recommended for:  Everyone born from [birth year] through 1966.  Anyone with known risk factors for hepatitis C. Sexually transmitted infections (STIs)  You should be screened for sexually transmitted infections (STIs) including gonorrhea and chlamydia if:  You are sexually active and are younger than 51 years of age.  You are older than 51 years of age and your health care provider tells you that you are at risk for this type of infection.  Your sexual activity has changed since you were last screened and you are at an increased risk for chlamydia or gonorrhea. Ask your health care provider if you are at risk.  If you do not have HIV, but are at risk, it may be recommended that you take a prescription medicine daily to prevent HIV infection. This is called pre-exposure prophylaxis (PrEP). You are considered at risk if:  You are sexually active and do not regularly use condoms or know the HIV status of your partner(s).  You take drugs by injection.  You are sexually  active with a partner who has HIV. Talk with your health care provider about whether you are at high risk of being infected with HIV. If you choose to begin PrEP, you should first be tested for HIV. You should then be tested every 3 months for as long as you are taking PrEP.  PREGNANCY   If you are premenopausal and you may become pregnant, ask your health care provider about preconception counseling.  If you may  become pregnant, take 400 to 800 micrograms (mcg) of folic acid every day.  If you want to prevent pregnancy, talk to your health care provider about birth control (contraception). OSTEOPOROSIS AND MENOPAUSE   Osteoporosis is a disease in which the bones lose minerals and strength with aging. This can result in serious bone fractures. Your risk for osteoporosis can be identified using a bone density scan.  If you are 61 years of age or older, or if you are at risk for osteoporosis and fractures, ask your health care provider if you should be screened.  Ask your health care provider whether you should take a calcium or vitamin D supplement to lower your risk for osteoporosis.  Menopause may have certain physical symptoms and risks.  Hormone replacement therapy may reduce some of these symptoms and risks. Talk to your health care provider about whether hormone replacement therapy is right for you.  HOME CARE INSTRUCTIONS   Schedule regular health, dental, and eye exams.  Stay current with your immunizations.   Do not use any tobacco products including cigarettes, chewing tobacco, or electronic cigarettes.  If you are pregnant, do not drink alcohol.  If you are breastfeeding, limit how much and how often you drink alcohol.  Limit alcohol intake to no more than 1 drink per day for nonpregnant women. One drink equals 12 ounces of beer, 5 ounces of wine, or 1 ounces of hard liquor.  Do not use street drugs.  Do not share needles.  Ask your health care provider for help if  you need support or information about quitting drugs.  Tell your health care provider if you often feel depressed.  Tell your health care provider if you have ever been abused or do not feel safe at home.   This information is not intended to replace advice given to you by your health care provider. Make sure you discuss any questions you have with your health care provider.   Document Released: 02/15/2011 Document Revised: 08/23/2014 Document Reviewed: 07/04/2013 Elsevier Interactive Patient Education [employer].

## 2015-12-15 NOTE — Progress Notes (Signed)
Subjective:    Patient ID: [name], female    DOB: 08-27-1965, 50 y.o.   MRN: [id number]  Pt presents to the office today for chronic follow up.  Diabetes She presents for her follow-up diabetic visit. She has type 2 diabetes mellitus. Hypoglycemia symptoms include nervousness/anxiousness. Pertinent negatives for hypoglycemia include no headaches. Pertinent negatives for diabetes include no blurred vision, no foot paresthesias, no foot ulcerations, no polyphagia and no visual change. There are no hypoglycemic complications. Symptoms are stable. There are no diabetic complications. Pertinent negatives for diabetic complications include no CVA, heart disease, impotence, nephropathy or peripheral neuropathy. Risk factors for coronary artery disease include stress, sedentary lifestyle and family history. Current diabetic treatment includes oral agent (monotherapy). She is compliant with treatment all of the time. She is following a generally healthy diet. Her lunch blood glucose range is generally 110-130 mg/dl. An ACE inhibitor/angiotensin II receptor blocker is not being taken. Eye exam is not current.  Anxiety Presents for follow-up visit. Onset was at an unknown time. The problem has been waxing and waning. Symptoms include depressed mood and nervous/anxious behavior. Patient reports no excessive worry, impotence, irritability, palpitations, panic or shortness of breath. Symptoms occur occasionally. The symptoms are aggravated by family issues.   Her past medical history is significant for anxiety/panic attacks and depression. Past treatments include SSRIs. Compliance with prior treatments has been good.  Back Pain This is a chronic problem. The current episode started more than 1 year ago. The problem occurs constantly. The problem has been waxing and waning since onset. The pain is present in the lumbar spine. The quality of the pain is described as aching. The pain is at a severity of 5/10.  The pain is mild. The symptoms are aggravated by bending. Pertinent negatives include no bladder incontinence, bowel incontinence, headaches or numbness. She has tried bed rest and muscle relaxant for the symptoms. The treatment provided moderate relief.      Review of Systems  Constitutional: Negative.  Negative for irritability.  HENT: Negative.   Eyes: Negative.  Negative for blurred vision.  Respiratory: Negative.  Negative for shortness of breath.   Cardiovascular: Negative.  Negative for palpitations.  Gastrointestinal: Negative.  Negative for bowel incontinence.  Endocrine: Negative.  Negative for polyphagia.  Genitourinary: Negative.  Negative for bladder incontinence and impotence.  Musculoskeletal: Positive for back pain.  Neurological: Negative.  Negative for numbness and headaches.  Hematological: Negative.   Psychiatric/Behavioral: The patient is nervous/anxious.   All other systems reviewed and are negative.      Objective:   Physical Exam  Constitutional: She is oriented to person, place, and time. She appears well-developed and well-nourished. No distress.  HENT:  Head: Normocephalic and atraumatic.  Right Ear: External ear normal.  Left Ear: External ear normal.  Nose: Nose normal.  Mouth/Throat: Oropharynx is clear and moist.  Eyes: Pupils are equal, round, and reactive to light.  Neck: Normal range of motion. Neck supple. No thyromegaly present.  Cardiovascular: Normal rate, regular rhythm, normal heart sounds and intact distal pulses.   No murmur heard. Pulmonary/Chest: Effort normal and breath sounds normal. No respiratory distress. She has no wheezes.  Abdominal: Soft. Bowel sounds are normal. She exhibits no distension. There is no tenderness.  Musculoskeletal: Normal range of motion. She exhibits no edema or tenderness.  Neurological: She is alert and oriented to person, place, and time. She has normal reflexes. No cranial nerve deficit.  Skin: Skin is  warm and dry.  Psychiatric: She has a normal mood and affect. Her behavior is normal. Judgment and thought content normal.  Vitals reviewed.     BP 122/76 mmHg  Pulse 86  Temp(Src) 97.2 F (36.2 C) (Oral)  Ht _0  (1.727 m)  Wt 161 lb 8 oz (73.256 kg)  BMI 24.56 kg/m2     Assessment & Plan:  1. Type 2 diabetes mellitus with hyperosmolarity without coma, without long-term current use of insulin (HCC) - Bayer DCA Hb A1c Waived - CMP14+EGFR - Microalbumin / creatinine urine ratio - Ambulatory referral to Ophthalmology  2. Tobacco user - CMP14+EGFR  3. GAD (generalized anxiety disorder) - CMP14+EGFR  4. Colon cancer screening - CMP14+EGFR - Fecal occult blood, imunochemical; Future  5. Chronic back pain   Continue all meds Labs pending Health Maintenance reviewed- PT has pap scheduled for June Diet and exercise encouraged RTO 6 months  Evelina Dun, FNP

## 2015-12-16 LAB — MICROALBUMIN / CREATININE URINE RATIO
CREATININE, UR: 62.1 mg/dL
MICROALB/CREAT RATIO: <4.8 mg/g creat (ref 0.0–30.0)

## 2015-12-17 DIAGNOSIS — F17210 Nicotine dependence, cigarettes, uncomplicated: Secondary | ICD-10-CM | POA: Diagnosis not present

## 2015-12-17 DIAGNOSIS — Z716 Tobacco abuse counseling: Secondary | ICD-10-CM | POA: Diagnosis not present

## 2015-12-17 DIAGNOSIS — F419 Anxiety disorder, unspecified: Secondary | ICD-10-CM | POA: Diagnosis not present

## 2016-01-22 ENCOUNTER — Encounter: Payer: BLUE CROSS/BLUE SHIELD | Admitting: Family

## 2016-01-26 DIAGNOSIS — Z1231 Encounter for screening mammogram for malignant neoplasm of breast: Secondary | ICD-10-CM | POA: Diagnosis not present

## 2016-02-02 DIAGNOSIS — L03031 Cellulitis of right toe: Secondary | ICD-10-CM | POA: Diagnosis not present

## 2016-02-05 ENCOUNTER — Encounter: Payer: BLUE CROSS/BLUE SHIELD | Admitting: Family

## 2016-02-13 ENCOUNTER — Encounter: Payer: Self-pay | Admitting: Family

## 2016-02-13 ENCOUNTER — Ambulatory Visit (INDEPENDENT_AMBULATORY_CARE_PROVIDER_SITE_OTHER): Payer: BLUE CROSS/BLUE SHIELD | Admitting: Family

## 2016-02-13 VITALS — BP 132/71 | HR 84 | Temp 97.6°F | Ht 68.0 in | Wt 161.4 lb

## 2016-02-13 DIAGNOSIS — M549 Dorsalgia, unspecified: Secondary | ICD-10-CM

## 2016-02-13 DIAGNOSIS — Z23 Encounter for immunization: Secondary | ICD-10-CM

## 2016-02-13 DIAGNOSIS — Z720 Tobacco use: Secondary | ICD-10-CM | POA: Diagnosis not present

## 2016-02-13 DIAGNOSIS — Z01419 Encounter for gynecological examination (general) (routine) without abnormal findings: Secondary | ICD-10-CM | POA: Diagnosis not present

## 2016-02-13 DIAGNOSIS — E1100 Type 2 diabetes mellitus with hyperosmolarity without nonketotic hyperglycemic-hyperosmolar coma (NKHHC): Secondary | ICD-10-CM | POA: Diagnosis not present

## 2016-02-13 DIAGNOSIS — Z0000 Encounter for general adult medical examination without abnormal findings: Secondary | ICD-10-CM | POA: Diagnosis not present

## 2016-02-13 DIAGNOSIS — Z114 Encounter for screening for human immunodeficiency virus [HIV]: Secondary | ICD-10-CM

## 2016-02-13 DIAGNOSIS — F411 Generalized anxiety disorder: Secondary | ICD-10-CM

## 2016-02-13 DIAGNOSIS — Z1211 Encounter for screening for malignant neoplasm of colon: Secondary | ICD-10-CM

## 2016-02-13 DIAGNOSIS — R87610 Atypical squamous cells of undetermined significance on cytologic smear of cervix (ASC-US): Secondary | ICD-10-CM | POA: Diagnosis not present

## 2016-02-13 DIAGNOSIS — G8929 Other chronic pain: Secondary | ICD-10-CM

## 2016-02-13 LAB — BAYER DCA HB A1C WAIVED: HB A1C: 7.3 % — AB (ref <7.0)

## 2016-02-13 NOTE — Progress Notes (Signed)
Subjective:    Patient ID: [name], female    DOB: Jul 25, 1966, 50 y.o.   MRN: [id number]  Pt presents to the office today for CPE with pap.   Gynecologic Exam The patient's pertinent negatives include no genital lesions, genital odor or vaginal discharge. The patient is experiencing no pain. Associated symptoms include back pain and flank pain. Pertinent negatives include no discolored urine, headaches or hematuria.  Diabetes She presents for her follow-up diabetic visit. She has type 2 diabetes mellitus. Hypoglycemia symptoms include nervousness/anxiousness. Pertinent negatives for hypoglycemia include no headaches. Pertinent negatives for diabetes include no blurred vision, no foot paresthesias, no foot ulcerations, no polyphagia and no visual change. There are no hypoglycemic complications. Symptoms are stable. There are no diabetic complications. Pertinent negatives for diabetic complications include no CVA, heart disease, impotence, nephropathy or peripheral neuropathy. Risk factors for coronary artery disease include stress, sedentary lifestyle and family history. Current diabetic treatment includes oral agent (monotherapy). She is compliant with treatment all of the time. She is following a generally healthy diet. Her breakfast blood glucose range is generally 110-130 mg/dl. An ACE inhibitor/angiotensin II receptor blocker is not being taken. Eye exam is not current.  Anxiety Presents for follow-up visit. Onset was at an unknown time. The problem has been waxing and waning. Symptoms include depressed mood and nervous/anxious behavior. Patient reports no excessive worry, impotence, irritability, palpitations, panic or shortness of breath. Symptoms occur occasionally. The symptoms are aggravated by family issues.   Her past medical history is significant for anxiety/panic attacks and depression. Past treatments include SSRIs. Compliance with prior treatments has been good.  Back Pain This  is a chronic problem. The current episode started more than 1 year ago. The problem occurs intermittently. The problem has been waxing and waning since onset. The pain is present in the lumbar spine. The quality of the pain is described as aching. The pain is at a severity of 3/10 (comes and goes). The pain is mild. The symptoms are aggravated by bending. Pertinent negatives include no bladder incontinence, bowel incontinence, headaches or numbness. She has tried bed rest and muscle relaxant for the symptoms. The treatment provided moderate relief.      Review of Systems  Constitutional: Negative.  Negative for irritability.  HENT: Negative.   Eyes: Negative.  Negative for blurred vision.  Respiratory: Negative.  Negative for shortness of breath.   Cardiovascular: Negative.  Negative for palpitations.  Gastrointestinal: Negative.  Negative for bowel incontinence.  Endocrine: Negative.  Negative for polyphagia.  Genitourinary: Positive for flank pain. Negative for bladder incontinence, hematuria, impotence and vaginal discharge.  Musculoskeletal: Positive for back pain.  Neurological: Negative.  Negative for numbness and headaches.  Hematological: Negative.   Psychiatric/Behavioral: The patient is nervous/anxious.   All other systems reviewed and are negative.      Objective:   Physical Exam  Constitutional: She is oriented to person, place, and time. She appears well-developed and well-nourished. No distress.  HENT:  Head: Normocephalic and atraumatic.  Right Ear: External ear normal.  Left Ear: External ear normal.  Nose: Nose normal.  Mouth/Throat: Oropharynx is clear and moist.  Eyes: Pupils are equal, round, and reactive to light.  Neck: Normal range of motion. Neck supple. No thyromegaly present.  Cardiovascular: Normal rate, regular rhythm, normal heart sounds and intact distal pulses.   No murmur heard. Pulmonary/Chest: Effort normal and breath sounds normal. No respiratory  distress. She has no wheezes. Right breast exhibits no inverted nipple,  no mass, no nipple discharge, no skin change and no tenderness. Left breast exhibits no inverted nipple, no mass, no nipple discharge, no skin change and no tenderness. Breasts are symmetrical.  Abdominal: Soft. Bowel sounds are normal. She exhibits no distension. There is no tenderness.  Genitourinary: Vagina normal.  Bimanual exam- no adnexal masses or tenderness, ovaries nonpalpable   Cervix parous and pink- No discharge   Musculoskeletal: Normal range of motion. She exhibits no edema or tenderness.  Neurological: She is alert and oriented to person, place, and time. She has normal reflexes. No cranial nerve deficit.  Skin: Skin is warm and dry.  Psychiatric: She has a normal mood and affect. Her behavior is normal. Judgment and thought content normal.  Vitals reviewed.     BP 132/71 mmHg  Pulse 84  Temp(Src) 97.6 F (36.4 C) (Oral)  Ht '5\' 8"'  (1.727 m)  Wt 161 lb 6.4 oz (73.211 kg)  BMI 24.55 kg/m2     Assessment & Plan:  1. Type 2 diabetes mellitus with hyperosmolarity without coma, without long-term current use of insulin ([place]) - Bayer [place] Hb A1c Waived - CMP14+EGFR - Ambulatory referral to Ophthalmology  2. Tobacco user - CMP14+EGFR  3. GAD (generalized anxiety disorder) - CMP14+EGFR  4. Chronic back pain - CMP14+EGFR  5. Annual physical exam - CMP14+EGFR - Lipid panel - Thyroid Panel With TSH - VITAMIN D 25 Hydroxy (Vit-D Deficiency, Fractures) - Anemia Profile B  6. Encounter for routine gynecological examination - CMP14+EGFR  7. Screening for HIV (human immunodeficiency virus) - CMP14+EGFR - HIV antibody  8. Colon cancer screening - CMP14+EGFR - Ambulatory referral to Gastroenterology   Continue all meds Labs pending Health Maintenance reviewed Diet and exercise encouraged RTO 6 months  Evelina Dun, FNP

## 2016-02-13 NOTE — Addendum Note (Signed)
Addended by: Shelbie Ammons on: 02/13/2016 04:19 PM   Modules accepted: Orders, SmartSet

## 2016-02-13 NOTE — Patient Instructions (Signed)
Health Maintenance, Female Adopting a healthy lifestyle and getting preventive care can go a long way to promote health and wellness. Talk with your health care provider about what schedule of regular examinations is right for you. This is a good chance for you to check in with your provider about disease prevention and staying healthy. In between checkups, there are plenty of things you can do on your own. Experts have done a lot of research about which lifestyle changes and preventive measures are most likely to keep you healthy. Ask your health care provider for more information. WEIGHT AND DIET  Eat a healthy diet  Be sure to include plenty of vegetables, fruits, low-fat dairy products, and lean protein.  Do not eat a lot of foods high in solid fats, added sugars, or salt.  Get regular exercise. This is one of the most important things you can do for your health.  Most adults should exercise for at least 150 minutes each week. The exercise should increase your heart rate and make you sweat (moderate-intensity exercise).  Most adults should also do strengthening exercises at least twice a week. This is in addition to the moderate-intensity exercise.  Maintain a healthy weight  Body mass index (BMI) is a measurement that can be used to identify possible weight problems. It estimates body fat based on height and weight. Your health care provider can help determine your BMI and help you achieve or maintain a healthy weight.  For females 20 years of age and older:   A BMI below 18.5 is considered underweight.  A BMI of 18.5 to 24.9 is normal.  A BMI of 25 to 29.9 is considered overweight.  A BMI of 30 and above is considered obese.  Watch levels of cholesterol and blood lipids  You should start having your blood tested for lipids and cholesterol at 51 years of age, then have this test every 5 years.  You may need to have your cholesterol levels checked more often if:  Your lipid  or cholesterol levels are high.  You are older than 50 years of age.  You are at high risk for heart disease.  CANCER SCREENING   Lung Cancer  Lung cancer screening is recommended for adults 55-80 years old who are at high risk for lung cancer because of a history of smoking.  A yearly low-dose CT scan of the lungs is recommended for people who:  Currently smoke.  Have quit within the past 15 years.  Have at least a 30-pack-year history of smoking. A pack year is smoking an average of one pack of cigarettes a day for 1 year.  Yearly screening should continue until it has been 15 years since you quit.  Yearly screening should stop if you develop a health problem that would prevent you from having lung cancer treatment.  Breast Cancer  Practice breast self-awareness. This means understanding how your breasts normally appear and feel.  It also means doing regular breast self-exams. Let your health care provider know about any changes, no matter how small.  If you are in your 20s or 30s, you should have a clinical breast exam (CBE) by a health care provider every 1-3 years as part of a regular health exam.  If you are 40 or older, have a CBE every year. Also consider having a breast X-ray (mammogram) every year.  If you have a family history of breast cancer, talk to your health care provider about genetic screening.  If you   are at high risk for breast cancer, talk to your health care provider about having an MRI and a mammogram every year.  Breast cancer gene (BRCA) assessment is recommended for women who have family members with BRCA-related cancers. BRCA-related cancers include:  Breast.  Ovarian.  Tubal.  Peritoneal cancers.  Results of the assessment will determine the need for genetic counseling and BRCA1 and BRCA2 testing. Cervical Cancer Your health care provider may recommend that you be screened regularly for cancer of the pelvic organs (ovaries, uterus, and  vagina). This screening involves a pelvic examination, including checking for microscopic changes to the surface of your cervix (Pap test). You may be encouraged to have this screening done every 3 years, beginning at age 21.  For women ages 30-65, health care providers may recommend pelvic exams and Pap testing every 3 years, or they may recommend the Pap and pelvic exam, combined with testing for human papilloma virus (HPV), every 5 years. Some types of HPV increase your risk of cervical cancer. Testing for HPV may also be done on women of any age with unclear Pap test results.  Other health care providers may not recommend any screening for nonpregnant women who are considered low risk for pelvic cancer and who do not have symptoms. Ask your health care provider if a screening pelvic exam is right for you.  If you have had past treatment for cervical cancer or a condition that could lead to cancer, you need Pap tests and screening for cancer for at least 20 years after your treatment. If Pap tests have been discontinued, your risk factors (such as having a new sexual partner) need to be reassessed to determine if screening should resume. Some women have medical problems that increase the chance of getting cervical cancer. In these cases, your health care provider may recommend more frequent screening and Pap tests. Colorectal Cancer  This type of cancer can be detected and often prevented.  Routine colorectal cancer screening usually begins at 50 years of age and continues through 51 years of age.  Your health care provider may recommend screening at an earlier age if you have risk factors for colon cancer.  Your health care provider may also recommend using home test kits to check for hidden blood in the stool.  A small camera at the end of a tube can be used to examine your colon directly (sigmoidoscopy or colonoscopy). This is done to check for the earliest forms of colorectal  cancer.  Routine screening usually begins at age 50.  Direct examination of the colon should be repeated every 5-10 years through 51 years of age. However, you may need to be screened more often if early forms of precancerous polyps or small growths are found. Skin Cancer  Check your skin from head to toe regularly.  Tell your health care provider about any new moles or changes in moles, especially if there is a change in a mole's shape or color.  Also tell your health care provider if you have a mole that is larger than the size of a pencil eraser.  Always use sunscreen. Apply sunscreen liberally and repeatedly throughout the day.  Protect yourself by wearing long sleeves, pants, a wide-brimmed hat, and sunglasses whenever you are outside. HEART DISEASE, DIABETES, AND HIGH BLOOD PRESSURE   High blood pressure causes heart disease and increases the risk of stroke. High blood pressure is more likely to develop in:  People who have blood pressure in the high end   of the normal range (130-139/85-89 mm Hg).  People who are overweight or obese.  People who are African American.  If you are 38-23 years of age, have your blood pressure checked every 3-5 years. If you are 61 years of age or older, have your blood pressure checked every year. You should have your blood pressure measured twice--once when you are at a hospital or clinic, and once when you are not at a hospital or clinic. Record the average of the two measurements. To check your blood pressure when you are not at a hospital or clinic, you can use:  An automated blood pressure machine at a pharmacy.  A home blood pressure monitor.  If you are between 45 years and 39 years old, ask your health care provider if you should take aspirin to prevent strokes.  Have regular diabetes screenings. This involves taking a blood sample to check your fasting blood sugar level.  If you are at a normal weight and have a low risk for diabetes,  have this test once every three years after 51 years of age.  If you are overweight and have a high risk for diabetes, consider being tested at a younger age or more often. PREVENTING INFECTION  Hepatitis B  If you have a higher risk for hepatitis B, you should be screened for this virus. You are considered at high risk for hepatitis B if:  You were born in a country where hepatitis B is common. Ask your health care provider which countries are considered high risk.  Your parents were born in a high-risk country, and you have not been immunized against hepatitis B (hepatitis B vaccine).  You have HIV or AIDS.  You use needles to inject street drugs.  You live with someone who has hepatitis B.  You have had sex with someone who has hepatitis B.  You get hemodialysis treatment.  You take certain medicines for conditions, including cancer, organ transplantation, and autoimmune conditions. Hepatitis C  Blood testing is recommended for:  Everyone born from [birth year] through 1966.  Anyone with known risk factors for hepatitis C. Sexually transmitted infections (STIs)  You should be screened for sexually transmitted infections (STIs) including gonorrhea and chlamydia if:  You are sexually active and are younger than 51 years of age.  You are older than 51 years of age and your health care provider tells you that you are at risk for this type of infection.  Your sexual activity has changed since you were last screened and you are at an increased risk for chlamydia or gonorrhea. Ask your health care provider if you are at risk.  If you do not have HIV, but are at risk, it may be recommended that you take a prescription medicine daily to prevent HIV infection. This is called pre-exposure prophylaxis (PrEP). You are considered at risk if:  You are sexually active and do not regularly use condoms or know the HIV status of your partner(s).  You take drugs by injection.  You are sexually  active with a partner who has HIV. Talk with your health care provider about whether you are at high risk of being infected with HIV. If you choose to begin PrEP, you should first be tested for HIV. You should then be tested every 3 months for as long as you are taking PrEP.  PREGNANCY   If you are premenopausal and you may become pregnant, ask your health care provider about preconception counseling.  If you may  become pregnant, take 400 to 800 micrograms (mcg) of folic acid every day.  If you want to prevent pregnancy, talk to your health care provider about birth control (contraception). OSTEOPOROSIS AND MENOPAUSE   Osteoporosis is a disease in which the bones lose minerals and strength with aging. This can result in serious bone fractures. Your risk for osteoporosis can be identified using a bone density scan.  If you are 61 years of age or older, or if you are at risk for osteoporosis and fractures, ask your health care provider if you should be screened.  Ask your health care provider whether you should take a calcium or vitamin D supplement to lower your risk for osteoporosis.  Menopause may have certain physical symptoms and risks.  Hormone replacement therapy may reduce some of these symptoms and risks. Talk to your health care provider about whether hormone replacement therapy is right for you.  HOME CARE INSTRUCTIONS   Schedule regular health, dental, and eye exams.  Stay current with your immunizations.   Do not use any tobacco products including cigarettes, chewing tobacco, or electronic cigarettes.  If you are pregnant, do not drink alcohol.  If you are breastfeeding, limit how much and how often you drink alcohol.  Limit alcohol intake to no more than 1 drink per day for nonpregnant women. One drink equals 12 ounces of beer, 5 ounces of wine, or 1 ounces of hard liquor.  Do not use street drugs.  Do not share needles.  Ask your health care provider for help if  you need support or information about quitting drugs.  Tell your health care provider if you often feel depressed.  Tell your health care provider if you have ever been abused or do not feel safe at home.   This information is not intended to replace advice given to you by your health care provider. Make sure you discuss any questions you have with your health care provider.   Document Released: 02/15/2011 Document Revised: 08/23/2014 Document Reviewed: 07/04/2013 Elsevier Interactive Patient Education [employer].

## 2016-02-14 LAB — CMP14+EGFR
A/G RATIO: 1.5 (ref 1.2–2.2)
ALK PHOS: 110 IU/L (ref 39–117)
ALT: 19 IU/L (ref 0–32)
AST: 18 IU/L (ref 0–40)
Albumin: 4.2 g/dL (ref 3.5–5.5)
BUN/Creatinine Ratio: 16 (ref 9–23)
BUN: 11 mg/dL (ref 6–24)
Bilirubin Total: 1 mg/dL (ref 0.0–1.2)
CO2: 24 mmol/L (ref 18–29)
CREATININE: 0.68 mg/dL (ref 0.57–1.00)
Calcium: 9.5 mg/dL (ref 8.7–10.2)
Chloride: 98 mmol/L (ref 96–106)
GFR calc Af Amer: 118 mL/min/{1.73_m2} (ref >59)
GFR calc non Af Amer: 102 mL/min/{1.73_m2} (ref >59)
GLUCOSE: 172 mg/dL — AB (ref 65–99)
Globulin, Total: 2.8 g/dL (ref 1.5–4.5)
Potassium: 4.2 mmol/L (ref 3.5–5.2)
Sodium: 139 mmol/L (ref 134–144)
Total Protein: 7 g/dL (ref 6.0–8.5)

## 2016-02-14 LAB — ANEMIA PROFILE B
BASOS ABS: 0 10*3/uL (ref 0.0–0.2)
Basos: 0 %
EOS (ABSOLUTE): 0.1 10*3/uL (ref 0.0–0.4)
Eos: 1 %
Ferritin: 167 ng/mL — ABNORMAL HIGH (ref 15–150)
Folate: 7.9 ng/mL (ref >3.0)
HEMATOCRIT: 43.6 % (ref 34.0–46.6)
Hemoglobin: 14.2 g/dL (ref 11.1–15.9)
IMMATURE GRANS (ABS): 0 10*3/uL (ref 0.0–0.1)
IMMATURE GRANULOCYTES: 0 %
IRON: 145 ug/dL (ref 27–159)
Iron Saturation: 44 % (ref 15–55)
LYMPHS: 38 %
Lymphocytes Absolute: 2.4 10*3/uL (ref 0.7–3.1)
MCH: 29 pg (ref 26.6–33.0)
MCHC: 32.6 g/dL (ref 31.5–35.7)
MCV: 89 fL (ref 79–97)
MONOCYTES: 3 %
Monocytes Absolute: 0.2 10*3/uL (ref 0.1–0.9)
NEUTROS ABS: 3.6 10*3/uL (ref 1.4–7.0)
Neutrophils: 58 %
Platelets: 294 10*3/uL (ref 150–379)
RBC: 4.9 x10E6/uL (ref 3.77–5.28)
RDW: 13.3 % (ref 12.3–15.4)
RETIC CT PCT: 1.2 % (ref 0.6–2.6)
Total Iron Binding Capacity: 331 ug/dL (ref 250–450)
UIBC: 186 ug/dL (ref 131–425)
VITAMIN B 12: 219 pg/mL (ref 211–946)
WBC: 6.4 10*3/uL (ref 3.4–10.8)

## 2016-02-14 LAB — HIV ANTIBODY (ROUTINE TESTING W REFLEX): HIV SCREEN 4TH GENERATION: NONREACTIVE

## 2016-02-14 LAB — LIPID PANEL
CHOLESTEROL TOTAL: 195 mg/dL (ref 100–199)
Chol/HDL Ratio: 5 ratio units — ABNORMAL HIGH (ref 0.0–4.4)
HDL: 39 mg/dL — AB (ref >39)
LDL Calculated: 126 mg/dL — ABNORMAL HIGH (ref 0–99)
Triglycerides: 149 mg/dL (ref 0–149)
VLDL CHOLESTEROL CAL: 30 mg/dL (ref 5–40)

## 2016-02-14 LAB — THYROID PANEL WITH TSH
Free Thyroxine Index: 2.6 (ref 1.2–4.9)
T3 Uptake Ratio: 28 % (ref 24–39)
T4 TOTAL: 9.3 ug/dL (ref 4.5–12.0)
TSH: 1.48 u[IU]/mL (ref 0.450–4.500)

## 2016-02-14 LAB — VITAMIN D 25 HYDROXY (VIT D DEFICIENCY, FRACTURES): VIT D 25 HYDROXY: 16.4 ng/mL — AB (ref 30.0–100.0)

## 2016-02-20 LAB — PAP IG W/ RFLX HPV ASCU: PAP Smear Comment: 0

## 2016-02-20 LAB — HPV DNA PROBE HIGH RISK, AMPLIFIED: HPV, high-risk: NEGATIVE

## 2016-02-23 ENCOUNTER — Other Ambulatory Visit: Payer: Self-pay | Admitting: Family

## 2016-02-23 ENCOUNTER — Other Ambulatory Visit: Payer: Self-pay | Admitting: *Deleted

## 2016-02-23 MED ORDER — VITAMIN D (ERGOCALCIFEROL) 1.25 MG (50000 UNIT) PO CAPS: 50000.0000 [IU] | ORAL_CAPSULE | ORAL | Status: DC

## 2016-03-01 ENCOUNTER — Ambulatory Visit (INDEPENDENT_AMBULATORY_CARE_PROVIDER_SITE_OTHER): Payer: BLUE CROSS/BLUE SHIELD | Admitting: Family

## 2016-03-01 ENCOUNTER — Encounter: Payer: Self-pay | Admitting: Family

## 2016-03-01 VITALS — BP 132/84 | HR 78 | Temp 97.7°F | Ht 68.0 in | Wt 166.2 lb

## 2016-03-01 DIAGNOSIS — L03031 Cellulitis of right toe: Secondary | ICD-10-CM | POA: Diagnosis not present

## 2016-03-01 DIAGNOSIS — E663 Overweight: Secondary | ICD-10-CM | POA: Diagnosis not present

## 2016-03-01 MED ORDER — AMOXICILLIN-POT CLAVULANATE 875-125 MG PO TABS: 1.0000 | ORAL_TABLET | Freq: Two times a day (BID) | ORAL | Status: DC

## 2016-03-01 NOTE — Patient Instructions (Signed)
Paronychia °Paronychia is an infection of the skin that surrounds a nail. It usually affects the skin around a fingernail, but it may also occur near a toenail. It often causes pain and swelling around the nail. This condition may come on suddenly or develop over a longer period. In some cases, a collection of pus (abscess) can form near or under the nail. Usually, paronychia is not serious and it clears up with treatment. °CAUSES °This condition may be caused by bacteria or fungi. It is commonly caused by either Streptococcus or Staphylococcus bacteria. The bacteria or fungi often cause the infection by getting into the affected area through an opening in the skin, such as a cut or a hangnail. °RISK FACTORS °This condition is more likely to develop in: °· People who get their hands wet often, such as those who work as dishwashers, bartenders, or nurses. °· People who bite their fingernails or suck their thumbs. °· People who trim their nails too short. °· People who have hangnails or injured fingertips. °· People who get manicures. °· People who have diabetes. °SYMPTOMS °Symptoms of this condition include: °· Redness and swelling of the skin near the nail. °· Tenderness around the nail when you touch the area. °· Pus-filled bumps under the cuticle. The cuticle is the skin at the base or sides of the nail. °· Fluid or pus under the nail. °· Throbbing pain in the area. °DIAGNOSIS °This condition is usually diagnosed with a physical exam. In some cases, a sample of pus may be taken from an abscess to be tested in a lab. This can help to determine what type of bacteria or fungi is causing the condition. °TREATMENT °Treatment for this condition depends on the cause and severity of the condition. If the condition is mild, it may clear up on its own in a few days. Your health care provider may recommend soaking the affected area in warm water a few times a day. When treatment is needed, the options may  include: °· Antibiotic medicine, if the condition is caused by a bacterial infection. °· Antifungal medicine, if the condition is caused by a fungal infection. °· Incision and drainage, if an abscess is present. In this procedure, the health care provider will cut open the abscess so the pus can drain out. °HOME CARE INSTRUCTIONS °· Soak the affected area in warm water if directed to do so by your health care provider. You may be told to do this for 20 minutes, 2-3 times a day. Keep the area dry in between soakings. °· Take medicines only as directed by your health care provider. °· If you were prescribed an antibiotic medicine, finish all of it even if you start to feel better. °· Keep the affected area clean. °· Do not try to drain a fluid-filled bump yourself. °· If you will be washing dishes or performing other tasks that require your hands to get wet, wear rubber gloves. You should also wear gloves if your hands might come in contact with irritating substances, such as cleaners or chemicals. °· Follow your health care provider's instructions about: °¨ Wound care. °¨ Bandage (dressing) changes and removal. °SEEK MEDICAL CARE IF: °· Your symptoms get worse or do not improve with treatment. °· You have a fever or chills. °· You have redness spreading from the affected area. °· You have continued or increased fluid, blood, or pus coming from the affected area. °· Your finger or knuckle becomes swollen or is difficult to move. °  °  This information is not intended to replace advice given to you by your health care provider. Make sure you discuss any questions you have with your health care provider. °  °Document Released: 01/26/2001 Document Revised: 12/17/2014 Document Reviewed: 07/10/2014 °Elsevier Interactive Patient Education ©2016 [employer] ° °

## 2016-03-01 NOTE — Progress Notes (Addendum)
   Subjective:    Patient ID: [name], female    DOB: 1966/05/05, 50 y.o.   MRN: [id number]  HPI PT presents to the office today with right great toenail pain, swelling, and erythemas. PT states it is draining a yellow discharge. Complaining of constant throbbing pain 8 out 10. PT has tried antibiotic, neosporin, soaking in epsom salt with mild relief.    Review of Systems  Constitutional: Negative.   HENT: Negative.   Eyes: Negative.   Respiratory: Negative.  Negative for shortness of breath.   Cardiovascular: Negative.  Negative for palpitations.  Gastrointestinal: Negative.   Endocrine: Negative.   Genitourinary: Negative.   Musculoskeletal: Positive for joint swelling.  Neurological: Negative.  Negative for headaches.  Hematological: Negative.   Psychiatric/Behavioral: Negative.   All other systems reviewed and are negative.      Objective:   Physical Exam  Constitutional: She is oriented to person, place, and time. She appears well-developed and well-nourished. No distress.  HENT:  Head: Normocephalic.  Cardiovascular: Normal rate, regular rhythm, normal heart sounds and intact distal pulses.   No murmur heard. Pulmonary/Chest: Effort normal and breath sounds normal. No respiratory distress. She has no wheezes.  Abdominal: Soft. Bowel sounds are normal. She exhibits no distension. There is no tenderness.  Musculoskeletal: Normal range of motion. She exhibits no edema or tenderness.  Neurological: She is alert and oriented to person, place, and time.  Skin: Skin is warm and dry.  Erythemas, swelling, drainage around nail bed around right  great toenail bed.   Psychiatric: She has a normal mood and affect. Her behavior is normal. Judgment and thought content normal.  Vitals reviewed.   BP 132/84 mmHg  Pulse 78  Temp(Src) 97.7 F (36.5 C) (Oral)  Ht 5\' 8"  (1.727 m)  Wt 166 lb 3.2 oz (75.388 kg)  BMI 25.28 kg/m2       Assessment & Plan:  1. Overweight (BMI  25.0-29.9)  2. Paronychia of great toe, right -Keep clean and dry -Do not squeeze or pick at -Keep dressing changed BID Soak foot 20 mins 3-4 times a day -RTO in 2 weeks or before it does not improve - amoxicillin-clavulanate (AUGMENTIN) 875-125 MG tablet; Take 1 tablet by mouth 2 (two) times daily.  Dispense: 20 tablet; Refill: 0  Evelina Dun, FNP

## 2016-03-18 ENCOUNTER — Encounter: Payer: Self-pay | Admitting: Family

## 2016-03-29 DIAGNOSIS — F17210 Nicotine dependence, cigarettes, uncomplicated: Secondary | ICD-10-CM | POA: Diagnosis not present

## 2016-03-29 DIAGNOSIS — Z716 Tobacco abuse counseling: Secondary | ICD-10-CM | POA: Diagnosis not present

## 2016-03-29 DIAGNOSIS — F419 Anxiety disorder, unspecified: Secondary | ICD-10-CM | POA: Diagnosis not present

## 2016-03-29 DIAGNOSIS — E119 Type 2 diabetes mellitus without complications: Secondary | ICD-10-CM | POA: Diagnosis not present

## 2016-04-15 ENCOUNTER — Ambulatory Visit (INDEPENDENT_AMBULATORY_CARE_PROVIDER_SITE_OTHER): Payer: BLUE CROSS/BLUE SHIELD

## 2016-04-15 ENCOUNTER — Encounter: Payer: Self-pay | Admitting: Family

## 2016-04-15 ENCOUNTER — Ambulatory Visit (INDEPENDENT_AMBULATORY_CARE_PROVIDER_SITE_OTHER): Payer: BLUE CROSS/BLUE SHIELD | Admitting: Family

## 2016-04-15 VITALS — BP 126/85 | HR 84 | Temp 97.4°F | Ht 68.0 in | Wt 164.0 lb

## 2016-04-15 DIAGNOSIS — M25561 Pain in right knee: Secondary | ICD-10-CM | POA: Diagnosis not present

## 2016-04-15 MED ORDER — NAPROXEN 500 MG PO TABS: 500.0000 mg | ORAL_TABLET | Freq: Two times a day (BID) | ORAL | 1 refills | Status: DC

## 2016-04-15 MED ORDER — PREDNISONE 10 MG (21) PO TBPK: ORAL_TABLET | ORAL | 0 refills | Status: DC

## 2016-04-15 NOTE — Progress Notes (Signed)
   Subjective:    Patient ID: [name], female    DOB: 16-Feb-1966, 50 y.o.   MRN: [id number]  Knee Pain   The incident occurred 2 days ago. There was no injury mechanism. The pain is present in the right knee. The quality of the pain is described as aching. The pain is at a severity of 9/10. The pain is moderate. The pain has been constant since onset. Pertinent negatives include no inability to bear weight, numbness or tingling. She reports no foreign bodies present. The symptoms are aggravated by movement and weight bearing. She has tried rest, non-weight bearing, acetaminophen and ice for the symptoms. The treatment provided mild relief.      Review of Systems  Musculoskeletal: Positive for gait problem and joint swelling.  Neurological: Negative for tingling and numbness.  All other systems reviewed and are negative.      Objective:   Physical Exam  Constitutional: She is oriented to person, place, and time. She appears well-developed and well-nourished. No distress.  HENT:  Head: Normocephalic.  Eyes: Pupils are equal, round, and reactive to light.  Neck: Normal range of motion. Neck supple. No thyromegaly present.  Cardiovascular: Normal rate, regular rhythm, normal heart sounds and intact distal pulses.   No murmur heard. Pulmonary/Chest: Effort normal and breath sounds normal. No respiratory distress. She has no wheezes.  Abdominal: Soft. Bowel sounds are normal. She exhibits no distension. There is no tenderness.  Musculoskeletal: Normal range of motion. She exhibits tenderness (lateral right knee pain with flexion or extension). She exhibits no edema.  Neurological: She is alert and oriented to person, place, and time.  Skin: Skin is warm and dry.  Psychiatric: She has a normal mood and affect. Her behavior is normal. Judgment and thought content normal.  Vitals reviewed.   BP 126/85   Pulse 84   Temp 97.4 F (36.3 C) (Oral)   Ht 5\' 8"  (1.727 m)   Wt 164 lb  (74.4 kg)   BMI 24.94 kg/m        Assessment & Plan:  1. Right knee pain -Rest -Ice  -RTO in 2 weeks if not imrpoved -Low carb diet discussed - DG Knee 1-2 Views Right; Future - naproxen (NAPROSYN) 500 MG tablet; Take 1 tablet (500 mg total) by mouth 2 (two) times daily with a meal.  Dispense: 60 tablet; Refill: 1 - predniSONE (STERAPRED UNI-PAK 21 TAB) 10 MG (21) TBPK tablet; Use as directed  Dispense: 21 tablet; Refill: 0  Evelina Dun, FNP

## 2016-04-15 NOTE — Patient Instructions (Signed)

## 2016-04-15 NOTE — Progress Notes (Signed)
Patient aware.

## 2016-05-21 ENCOUNTER — Other Ambulatory Visit: Payer: Self-pay | Admitting: Family

## 2016-05-25 DIAGNOSIS — Z23 Encounter for immunization: Secondary | ICD-10-CM | POA: Diagnosis not present

## 2016-06-02 DIAGNOSIS — Z7984 Long term (current) use of oral hypoglycemic drugs: Secondary | ICD-10-CM | POA: Diagnosis not present

## 2016-06-02 DIAGNOSIS — R1032 Left lower quadrant pain: Secondary | ICD-10-CM | POA: Diagnosis not present

## 2016-06-02 DIAGNOSIS — E119 Type 2 diabetes mellitus without complications: Secondary | ICD-10-CM | POA: Diagnosis not present

## 2016-06-02 DIAGNOSIS — F17200 Nicotine dependence, unspecified, uncomplicated: Secondary | ICD-10-CM | POA: Diagnosis not present

## 2016-06-02 DIAGNOSIS — K5730 Diverticulosis of large intestine without perforation or abscess without bleeding: Secondary | ICD-10-CM | POA: Diagnosis not present

## 2016-06-25 DIAGNOSIS — Z720 Tobacco use: Secondary | ICD-10-CM | POA: Diagnosis not present

## 2016-06-25 DIAGNOSIS — Z008 Encounter for other general examination: Secondary | ICD-10-CM | POA: Diagnosis not present

## 2016-07-19 DIAGNOSIS — Z716 Tobacco abuse counseling: Secondary | ICD-10-CM | POA: Diagnosis not present

## 2016-07-19 DIAGNOSIS — F17210 Nicotine dependence, cigarettes, uncomplicated: Secondary | ICD-10-CM | POA: Diagnosis not present

## 2016-08-30 DIAGNOSIS — Z716 Tobacco abuse counseling: Secondary | ICD-10-CM | POA: Diagnosis not present

## 2016-08-30 DIAGNOSIS — F419 Anxiety disorder, unspecified: Secondary | ICD-10-CM | POA: Diagnosis not present

## 2016-08-30 DIAGNOSIS — F17210 Nicotine dependence, cigarettes, uncomplicated: Secondary | ICD-10-CM | POA: Diagnosis not present

## 2016-08-30 DIAGNOSIS — E119 Type 2 diabetes mellitus without complications: Secondary | ICD-10-CM | POA: Diagnosis not present

## 2016-09-22 ENCOUNTER — Ambulatory Visit (INDEPENDENT_AMBULATORY_CARE_PROVIDER_SITE_OTHER): Payer: BLUE CROSS/BLUE SHIELD | Admitting: Pediatrics

## 2016-09-22 ENCOUNTER — Encounter: Payer: Self-pay | Admitting: Pediatrics

## 2016-09-22 ENCOUNTER — Encounter (INDEPENDENT_AMBULATORY_CARE_PROVIDER_SITE_OTHER): Payer: Self-pay

## 2016-09-22 VITALS — BP 129/80 | HR 73 | Temp 96.7°F | Ht 68.0 in | Wt 169.0 lb

## 2016-09-22 DIAGNOSIS — E1100 Type 2 diabetes mellitus with hyperosmolarity without nonketotic hyperglycemic-hyperosmolar coma (NKHHC): Secondary | ICD-10-CM | POA: Diagnosis not present

## 2016-09-22 DIAGNOSIS — N950 Postmenopausal bleeding: Secondary | ICD-10-CM | POA: Diagnosis not present

## 2016-09-22 DIAGNOSIS — F17210 Nicotine dependence, cigarettes, uncomplicated: Secondary | ICD-10-CM | POA: Diagnosis not present

## 2016-09-22 DIAGNOSIS — E559 Vitamin D deficiency, unspecified: Secondary | ICD-10-CM

## 2016-09-22 DIAGNOSIS — Z716 Tobacco abuse counseling: Secondary | ICD-10-CM | POA: Diagnosis not present

## 2016-09-22 DIAGNOSIS — N939 Abnormal uterine and vaginal bleeding, unspecified: Secondary | ICD-10-CM | POA: Diagnosis not present

## 2016-09-22 LAB — BAYER DCA HB A1C WAIVED: HB A1C (BAYER DCA - WAIVED): 7.3 % — ABNORMAL HIGH (ref <7.0)

## 2016-09-22 NOTE — Progress Notes (Signed)
  Subjective:   Patient ID: [name], female    DOB: May 23, 1966, 51 y.o.   MRN: [id number] CC: Vaginal Bleeding  HPI: [name] is a 51 y.o. female presenting for Vaginal Bleeding  Stopped having periods several years ago Started having bleeding about a month ago Some heavy almost as heavy as a period, other times just spotting Lasted for 4 days at first Continues to come and go, often has blood on pad in the morning Not recently seen a gynecologist Eating normally, appetite is fine Not sexually active now No dysuria No other vaginal discharge  Not checking BGLs at home Taking metformin regularly  Off of vitamin D  Relevant past medical, surgical, family and social history reviewed. Allergies and medications reviewed and updated. History  Smoking Status  . Current Every Day Smoker  . Packs/day: 1.00  . Types: Cigarettes  Smokeless Tobacco  . Never Used   ROS: Per HPI   Objective:    BP 129/80   Pulse 73   Temp (!) 96.7 F (35.9 C) (Oral)   Ht 5\' 8"  (1.727 m)   Wt 169 lb (76.7 kg)   BMI 25.70 kg/m   Wt Readings from Last 3 Encounters:  09/22/16 169 lb (76.7 kg)  04/15/16 164 lb (74.4 kg)  03/01/16 166 lb 3.2 oz (75.4 kg)    Gen: NAD, alert, cooperative with exam, NCAT EYES: EOMI, no conjunctival injection, or no icterus CV: NRRR, normal S1/S2, no murmur, distal pulses 2+ b/l Resp: CTABL, no wheezes, normal WOB Abd: +BS, soft, NTND. Mildly TTP L lower quadrant, no guarding or organomegaly Ext: No edema, warm Neuro: Alert and oriented, strength equal b/l UE and LE, coordination grossly normal  MSK: normal muscle bulk  Assessment & Plan:  [name] was seen today for vaginal bleeding.  Diagnoses and all orders for this visit:  Post-menopausal bleeding -     Ambulatory referral to Gynecology  Type 2 diabetes mellitus with hyperosmolarity without coma, without long-term current use of insulin (HCC) A1c 7.3 Decrease sugary food intake Recheck 3  months Cont metformin -     Bayer DCA Hb A1c Waived  Vitamin D deficiency Restart vitamin D Level low Recheck 3 months -     VITAMIN D 25 Hydroxy (Vit-D Deficiency, Fractures) -     Vitamin D, Ergocalciferol, (DRISDOL) 50000 units CAPS capsule; Take 1 capsule (50,000 Units total) by mouth once a week.   Follow up plan: 3 mo Assunta Found, MD [place]

## 2016-09-23 ENCOUNTER — Telehealth: Payer: Self-pay | Admitting: Family

## 2016-09-23 LAB — VITAMIN D 25 HYDROXY (VIT D DEFICIENCY, FRACTURES): VIT D 25 HYDROXY: 13.3 ng/mL — AB (ref 30.0–100.0)

## 2016-09-23 MED ORDER — VITAMIN D (ERGOCALCIFEROL) 1.25 MG (50000 UNIT) PO CAPS: 50000.0000 [IU] | ORAL_CAPSULE | ORAL | 1 refills | Status: DC

## 2016-09-23 NOTE — Telephone Encounter (Signed)
LMTCB with more information on the Dr. Karen Kays to find a Dr. Nolon Bussing

## 2016-09-24 NOTE — Telephone Encounter (Signed)
Updated referral to appropriate physician

## 2016-10-04 DIAGNOSIS — N950 Postmenopausal bleeding: Secondary | ICD-10-CM | POA: Diagnosis not present

## 2016-10-04 DIAGNOSIS — N939 Abnormal uterine and vaginal bleeding, unspecified: Secondary | ICD-10-CM | POA: Diagnosis not present

## 2016-10-04 DIAGNOSIS — D509 Iron deficiency anemia, unspecified: Secondary | ICD-10-CM | POA: Diagnosis not present

## 2016-10-06 DIAGNOSIS — Z716 Tobacco abuse counseling: Secondary | ICD-10-CM | POA: Diagnosis not present

## 2016-10-06 DIAGNOSIS — E119 Type 2 diabetes mellitus without complications: Secondary | ICD-10-CM | POA: Diagnosis not present

## 2016-10-06 DIAGNOSIS — F17210 Nicotine dependence, cigarettes, uncomplicated: Secondary | ICD-10-CM | POA: Diagnosis not present

## 2016-10-13 DIAGNOSIS — N950 Postmenopausal bleeding: Secondary | ICD-10-CM | POA: Diagnosis not present

## 2016-10-13 DIAGNOSIS — C541 Malignant neoplasm of endometrium: Secondary | ICD-10-CM | POA: Diagnosis not present

## 2016-10-25 ENCOUNTER — Encounter: Payer: Self-pay | Admitting: Gynecologic Oncology

## 2016-10-28 ENCOUNTER — Ambulatory Visit: Payer: BLUE CROSS/BLUE SHIELD | Attending: Gynecologic Oncology | Admitting: Gynecologic Oncology

## 2016-10-28 ENCOUNTER — Encounter: Payer: Self-pay | Admitting: Gynecologic Oncology

## 2016-10-28 DIAGNOSIS — N950 Postmenopausal bleeding: Secondary | ICD-10-CM | POA: Diagnosis not present

## 2016-10-28 DIAGNOSIS — Z85828 Personal history of other malignant neoplasm of skin: Secondary | ICD-10-CM | POA: Diagnosis not present

## 2016-10-28 DIAGNOSIS — C541 Malignant neoplasm of endometrium: Secondary | ICD-10-CM | POA: Diagnosis not present

## 2016-10-28 DIAGNOSIS — F17210 Nicotine dependence, cigarettes, uncomplicated: Secondary | ICD-10-CM | POA: Insufficient documentation

## 2016-10-28 DIAGNOSIS — R1900 Intra-abdominal and pelvic swelling, mass and lump, unspecified site: Secondary | ICD-10-CM | POA: Diagnosis present

## 2016-10-28 DIAGNOSIS — E119 Type 2 diabetes mellitus without complications: Secondary | ICD-10-CM | POA: Diagnosis not present

## 2016-10-28 DIAGNOSIS — Z7984 Long term (current) use of oral hypoglycemic drugs: Secondary | ICD-10-CM | POA: Insufficient documentation

## 2016-10-28 NOTE — Progress Notes (Signed)
Consult Note: Gyn-Onc  Consult was requested by Dr. Radene Knee for the evaluation of [name] 51 y.o. female  CC:  Chief Complaint  Patient presents with  . Pelvic Mass    Assessment/Plan:  Ms. [name]  is a 50 y.o.  year old with grade 1 endometrioid endometrial adenocarcinoma.  A detailed discussion was held with the patient and her family with regard to to her endometrial cancer diagnosis. We discussed the standard management options for uterine cancer which includes surgery followed possibly by adjuvant therapy depending on the results of surgery. The options for surgical management include a hysterectomy and removal of the tubes and ovaries possibly with removal of pelvic and para-aortic lymph nodes.If feasible, a minimally invasive approach including a robotic hysterectomy or laparoscopic hysterectomy have benefits including shorter hospital stay, recovery time and better wound healing than with open surgery. The patient has been counseled about these surgical options and the risks of surgery in general including infection, bleeding, damage to surrounding structures (including bowel, bladder, ureters, nerves or vessels), and the postoperative risks of PE/ DVT, and lymphedema. I extensively reviewed the additional risks of robotic hysterectomy including possible need for conversion to open laparotomy.  I discussed positioning during surgery of trendelenberg and risks of minor facial swelling and care we take in preoperative positioning.  I discussed that she has increased risks related to pulmonary issues and healing and infection due to her history of being a smoker. After counseling and consideration of her options, she desires to proceed with robotic assisted total hysterectomy with bilateral sapingo-oophorectomy and SLN biopsy.  She has a narrow vagina and it is possible that she may require a minilap for specimen delivery.  She will be seen by anesthesia for preoperative clearance  and discussion of postoperative pain management.  She was given the opportunity to ask questions, which were answered to her satisfaction, and she is agreement with the above mentioned plan of care.   HPI: [name] is a 51 year old G0 who is seen in consultation at the request of Dr Radene Knee for grade 1 endometrial cancer.  The patient reported post-menopausal bleeding to her physician who performed a TVUS on 10/13/16. This showed a uterus measuring 7.6x4.6x 6.26cm.  The endometrial lining measuringed 2.24cm. The ovaries were normal. A 4cm intracavitary mass was appreciated on [place].  The patient then underwent endometrial pipelle biopsy on 10/13/16. It revealed an endometrioid adenocarcinoma, (FIGO grade 1).   Current Meds:  Outpatient Encounter Prescriptions as of 10/28/2016  Medication Sig  . cyclobenzaprine (FLEXERIL) 10 MG tablet Take 10 mg by mouth as needed for muscle spasms.  Marland Kitchen escitalopram (LEXAPRO) 10 MG tablet Take 1 tablet (10 mg total) by mouth daily.  . metFORMIN (GLUCOPHAGE-XR) 500 MG 24 hr tablet 1/2 po QD  . Vitamin D, Ergocalciferol, (DRISDOL) 50000 units CAPS capsule Take 1 capsule (50,000 Units total) by mouth once a week.   No facility-administered encounter medications on file as of 10/28/2016.     Allergy: No Known Allergies  Social Hx:   Social History   Social History  . Marital status: Married    Spouse name: N/A  . Number of children: N/A  . Years of education: N/A   Occupational History  . Not on file.   Social History Main Topics  . Smoking status: Current Every Day Smoker    Packs/day: 1.00    Years: 35.00    Types: Cigarettes  . Smokeless tobacco: Never Used  . Alcohol use  No  . Drug use: No  . Sexual activity: Yes   Other Topics Concern  . Not on file   Social History Narrative  . No narrative on file    Past Surgical Hx:  Past Surgical History:  Procedure Laterality Date  . BASAL CELL CARCINOMA EXCISION     face  . GANGLION CYST  EXCISION Left 08/06/2014   Procedure: EXCISION LEFT  WRIST GANGLION;  Surgeon: Leanora Cover, MD;  Location: [facility];  Service: Orthopedics;  Laterality: Left;  . KNEE SURGERY     right x3  . TOOTH EXTRACTION      Past Medical Hx:  Past Medical History:  Diagnosis Date  . Basal cell carcinoma    face  . Diabetes mellitus without complication ([place])   . History of bronchitis     Past Gynecological History:  G0 No LMP recorded. Patient is not currently having periods (Reason: Premenopausal).  Family Hx:  Family History  Problem Relation Age of Onset  . Hypertension Mother   . Heart disease Father   . Arthritis Sister     rheumatoid  . Cancer Brother     prostate    Review of Systems:  Constitutional  Feels well,    ENT Normal appearing ears and nares bilaterally Skin/Breast  No rash, sores, jaundice, itching, dryness Cardiovascular  No chest pain, shortness of breath, or edema  Pulmonary  No cough or wheeze.  Gastro Intestinal  No nausea, vomitting, or diarrhoea. No bright red blood per rectum, no abdominal pain, change in bowel movement, or constipation.  Genito Urinary  No frequency, urgency, dysuria, + postmenopausal bleeding Musculo Skeletal  No myalgia, arthralgia, joint swelling or pain  Neurologic  No weakness, numbness, change in gait,  Psychology  No depression, anxiety, insomnia.   Vitals:  Blood pressure (!) 154/76, pulse 87, temperature 97.7 F (36.5 C), temperature source Oral, resp. rate (!) 21, height 5\' 5"  (1.651 m), weight 164 lb 2 oz (74.4 kg), SpO2 98 %.  Physical Exam: WD in NAD Neck  Supple NROM, without any enlargements.  Lymph Node Survey No cervical supraclavicular or inguinal adenopathy Cardiovascular  Pulse normal rate, regularity and rhythm. S1 and S2 normal.  Lungs  Clear to auscultation bilateraly, without wheezes/crackles/rhonchi. Good air movement.  Skin  No rash/lesions/breakdown  Psychiatry  Alert and  oriented to person, place, and time  Abdomen  Normoactive bowel sounds, abdomen soft, non-tender and thin without evidence of hernia.  Back No CVA tenderness Genito Urinary  Vulva/vagina: Normal external female genitalia.   No lesions. No discharge or bleeding.  Bladder/urethra:  No lesions or masses, well supported bladder  Vagina: narrow, no lesions  Cervix: Normal appearing, no lesions.  Uterus:  Small, mobile, no parametrial involvement or nodularity.  Adnexa: no palpable masses. Rectal  deferred Extremities  No bilateral cyanosis, clubbing or edema.   Donaciano Eva, MD  10/28/2016, 3:51 PM

## 2016-10-28 NOTE — Patient Instructions (Signed)
Preparing for your Surgery  Plan for surgery on November 16, 2016 with Dr. [name] Planned procedure is Robotic hysterectomy with bilateral salpingo-oophorectomy, and sentinel lymph node biopsy.  Bilateral Salpingo-Oophorectomy Bilateral salpingo-oophorectomy is the surgical removal of both fallopian tubes and both ovaries. The ovaries are small organs that produce eggs in women. The fallopian tubes transport the egg from the ovary to the womb (uterus). Usually, when this surgery is done, the uterus was previously removed. A bilateral salpingo-oophorectomy may be done to treat cancer or to reduce the risk of cancer in women who are at high risk. Removing both fallopian tubes and both ovaries will make you unable to become pregnant (sterile). It will also put you into menopause so that you will no longer have menstrual periods and may have menopausal symptoms such as hot flashes, night sweats, and mood changes. It will not affect your sex drive. Tell a health care provider about:  Any allergies you have.  All medicines you are taking, including vitamins, herbs, eye drops, creams, and over-the-counter medicines.  Previous problems you or family members have had with anesthetic medicines.  Any blood disorders you have.  Previous surgeries you have had.  Any medical conditions you have. What are the risks? Generally, this is a safe procedure. However, as with any procedure, complications can occur. Possible complications include:  Injury to surrounding organs.  Bleeding.  Infection.  Blood clots in the legs or lungs.  Problems related to anesthesia. What happens before the procedure?  Ask your health care provider about changing or stopping your regular medicines. You may need to stop taking certain medicines, such as aspirin or blood thinners, at least 1 week before the surgery.  Do not eat or drink anything for at least 8 hours before the surgery.  If you smoke, do  not smoke for at least 2 weeks before the surgery.  Make plans to have someone drive you home after the procedure or after your hospital stay. Also arrange for someone to help you with activities during recovery. What happens during the procedure?  You will be given medicine to help you relax before the procedure (sedative). You will then be given medicine to make you sleep through the procedure (general anesthetic). These medicines will be given through an IV access tube that is put into one of your veins.  Once you are asleep, your lower abdomen will be shaved and cleaned. A thin, flexible tube (catheter) will be placed in your bladder.  The surgeon may use a laparoscopic, robotic, or open technique for this surgery:  In the laparoscopic technique, the surgery is done through two small cuts (incisions) in the abdomen. A thin, lighted tube with a tiny camera on the end (laparoscope) is inserted into one of the incisions. The tools needed for the procedure are put through the other incision.  A robotic technique may be chosen to perform complex surgery in a small space. In the robotic technique, small incisions will be made. A camera and surgical instruments are passed through the incisions. Surgical instruments will be controlled with the help of a robotic arm.  In the open technique, the surgery is done through one large incision in the abdomen.  Using any of these techniques, the surgeon removes the fallopian tubes and ovaries. The blood vessels will be clamped and tied.  The surgeon then uses staples or stitches to close the incision or incisions. What happens after the procedure?  You will be taken to a recovery  area where you will be monitored for 1 to 3 hours. Your blood pressure, pulse, and temperature will be checked often. You will remain in the recovery area until you are stable and waking up.  If the laparoscopic technique was used, you may be allowed to go home after several  hours. You may have some shoulder pain after the laparoscopic procedure. This is normal and usually goes away in a day or two.  If the open technique was used, you will be admitted to the hospital for a couple of days.  You will be given pain medicine as needed.  The IV access tube and catheter will be removed before you are discharged. This information is not intended to replace advice given to you by your health care provider. Make sure you discuss any questions you have with your health care provider. Document Released: 08/02/2005 Document Revised: 07/02/2016 Document Reviewed: 01/24/2013 Elsevier Interactive Patient Education  2017 [name].   Pre-operative [facility] will receive a phone call from presurgical testing at [facility] to arrange for a pre-operative testing appointment before your surgery.  This appointment normally occurs one to two weeks before your scheduled surgery.   -Bring your insurance card, copy of an advanced directive if applicable, medication list  -At that visit, you will be asked to sign a consent for a possible blood transfusion in case a transfusion becomes necessary during surgery.  The need for a blood transfusion is rare but having consent is a necessary part of your care.     -You should not be taking blood thinners or aspirin at least ten days prior to surgery unless instructed by your surgeon.  Day Before Surgery at [place] will be asked to take in a light diet the day before surgery.  Avoid carbonated beverages.  You will be advised to have nothing to eat or drink after midnight the evening before.     Eat a light diet the day before surgery.  Examples including soups, broths,  toast, yogurt, mashed potatoes.  Things to avoid include carbonated beverages  (fizzy beverages), raw fruits and raw vegetables, or beans.    If your bowels are filled with gas, your surgeon will have difficulty  visualizing your pelvic organs which increases  your surgical risks.  Your role in recovery Your role is to become active as soon as directed by your doctor, while still giving yourself time to heal.  Rest when you feel tired. You will be asked to do the following in order to speed your recovery:  - Cough and breathe deeply. This helps toclear and expand your lungs and can prevent pneumonia. You may be given a spirometer to practice deep breathing. A staff member will show you how to use the spirometer. - Do mild physical activity. Walking or moving your legs help your circulation and body functions return to normal. A staff member will help you when you try to walk and will provide you with simple exercises. Do not try to get up or walk alone the first time. - Actively manage your pain. Managing your pain lets you move in comfort. We will ask you to rate your pain on a scale of zero to 10. It is your responsibility to tell your doctor or nurse where and how much you hurt so your pain can be treated.  Special Considerations -If you are diabetic, you may be placed on insulin after surgery to have closer control over your blood sugars to promote  healing and recovery.  This does not mean that you will be discharged on insulin.  If applicable, your oral antidiabetics will be resumed when you are tolerating a solid diet.  -Your final pathology results from surgery should be available by the Friday after surgery and the results will be relayed to you when available.  Eat a light diet the day before surgery.  Examples including soups, broths, toast, yogurt, mashed potatoes.  Things to avoid include carbonated beverages (fizzy beverages), raw fruits and raw vegetables, or beans.   If your bowels are filled with gas, your surgeon will have difficulty visualizing your pelvic organs which increases your surgical risks. Blood Transfusion Information WHAT IS A BLOOD TRANSFUSION? A transfusion is the replacement of blood or some of its parts. Blood is made  up of multiple cells which provide different functions.  Red blood cells carry oxygen and are used for blood loss replacement.  White blood cells fight against infection.  Platelets control bleeding.  Plasma helps clot blood.  Other blood products are available for specialized needs, such as hemophilia or other clotting disorders. BEFORE THE TRANSFUSION  Who gives blood for transfusions?   You may be able to donate blood to be used at a later date on yourself (autologous donation).  Relatives can be asked to donate blood. This is generally not any safer than if you have received blood from a stranger. The same precautions are taken to ensure safety when a relative's blood is donated.  Healthy volunteers who are fully evaluated to make sure their blood is safe. This is blood bank blood. Transfusion therapy is the safest it has ever been in the practice of medicine. Before blood is taken from a donor, a complete history is taken to make sure that person has no history of diseases nor engages in risky social behavior (examples are intravenous drug use or sexual activity with multiple partners). The donor's travel history is screened to minimize risk of transmitting infections, such as malaria. The donated blood is tested for signs of infectious diseases, such as HIV and hepatitis. The blood is then tested to be sure it is compatible with you in order to minimize the chance of a transfusion reaction. If you or a relative donates blood, this is often done in anticipation of surgery and is not appropriate for emergency situations. It takes many days to process the donated blood. RISKS AND COMPLICATIONS Although transfusion therapy is very safe and saves many lives, the main dangers of transfusion include:   Getting an infectious disease.  Developing a transfusion reaction. This is an allergic reaction to something in the blood you were given. Every precaution is taken to prevent this. The  decision to have a blood transfusion has been considered carefully by your caregiver before blood is given. Blood is not given unless the benefits outweigh the risks.

## 2016-11-02 ENCOUNTER — Encounter (HOSPITAL_BASED_OUTPATIENT_CLINIC_OR_DEPARTMENT_OTHER): Admission: RE | Payer: Self-pay | Source: Ambulatory Visit

## 2016-11-02 ENCOUNTER — Ambulatory Visit (HOSPITAL_BASED_OUTPATIENT_CLINIC_OR_DEPARTMENT_OTHER)
Admission: RE | Admit: 2016-11-02 | Payer: BLUE CROSS/BLUE SHIELD | Source: Ambulatory Visit | Admitting: Obstetrics and Gynecology

## 2016-11-02 SURGERY — DILATATION & CURETTAGE/HYSTEROSCOPY WITH MYOSURE
Anesthesia: Choice

## 2016-11-03 NOTE — Patient Instructions (Addendum)
[name]  11/03/2016   Your procedure is scheduled on: 11/16/2016    Report to [facility] Main  Entrance take [place]  elevators to 3rd floor to  [place] at   [place] AM.  Call this number if you have problems the morning of surgery [phone]   Remember: ONLY 1 PERSON MAY GO WITH YOU TO SHORT STAY TO GET  READY MORNING OF YOUR SURGERY.  Do not eat food or drink liquids :After Midnight.             Clear liquid diet the day before surgery.       Take these medicines the morning of surgery with A SIP OF WATER: lexapro.  DO NOT TAKE ANY DIABETIC MEDICATIONS DAY OF YOUR SURGERY                               You may not have any metal on your body including hair pins and              piercings  Do not wear jewelry, make-up, lotions, powders or perfumes, deodorant             Do not wear nail polish.  Do not shave  48 hours prior to surgery.               Do not bring valuables to the hospital. [place].  Contacts, dentures or bridgework may not be worn into surgery.  Leave suitcase in the car. After surgery it may be brought to your room.        Special Instructions: coughing and deep breathing exercises, leg exercises               Please read over the following fact sheets you were given: _____________________________________________________________________             [facility] - Preparing for Surgery Before surgery, you can play an important role.  Because skin is not sterile, your skin needs to be as free of germs as possible.  You can reduce the number of germs on your skin by washing with CHG (chlorahexidine gluconate) soap before surgery.  CHG is an antiseptic cleaner which kills germs and bonds with the skin to continue killing germs even after washing. Please DO NOT use if you have an allergy to CHG or antibacterial soaps.  If your skin becomes reddened/irritated stop using the CHG  and inform your nurse when you arrive at Short Stay. Do not shave (including legs and underarms) for at least 48 hours prior to the first CHG shower.  You may shave your face/neck. Please follow these instructions carefully:  1.  Shower with CHG Soap the night before surgery and the  morning of Surgery.  2.  If you choose to wash your hair, wash your hair first as usual with your  normal  shampoo.  3.  After you shampoo, rinse your hair and body thoroughly to remove the  shampoo.                           4.  Use CHG as you would any other liquid soap.  You can apply chg directly  to the skin and wash                       Gently with a scrungie or clean washcloth.  5.  Apply the CHG Soap to your body ONLY FROM THE NECK DOWN.   Do not use on face/ open                           Wound or open sores. Avoid contact with eyes, ears mouth and genitals (private parts).                       Wash face,  Genitals (private parts) with your normal soap.             6.  Wash thoroughly, paying special attention to the area where your surgery  will be performed.  7.  Thoroughly rinse your body with warm water from the neck down.  8.  DO NOT shower/wash with your normal soap after using and rinsing off  the CHG Soap.                9.  Pat yourself dry with a clean towel.            10.  Wear clean pajamas.            11.  Place clean sheets on your bed the night of your first shower and do not  sleep with pets. Day of Surgery : Do not apply any lotions/deodorants the morning of surgery.  Please wear clean clothes to the hospital/surgery center.  FAILURE TO FOLLOW THESE INSTRUCTIONS MAY RESULT IN THE CANCELLATION OF YOUR SURGERY PATIENT SIGNATURE_________________________________  NURSE SIGNATURE__________________________________  ________________________________________________________________________    CLEAR LIQUID DIET   Foods Allowed                                                                      Foods Excluded  Coffee and tea, regular and decaf                             liquids that you cannot  Plain Jell-O in any flavor                                             see through such as: Fruit ices (not with fruit pulp)                                     milk, soups, orange juice  Iced Popsicles                                    All solid food Carbonated beverages, regular and diet  Cranberry, grape and apple juices Sports drinks like Gatorade Lightly seasoned clear broth or consume(fat free) Sugar, honey syrup  Sample Menu Breakfast                                Lunch                                     Supper Cranberry juice                    Beef broth                            Chicken broth Jell-O                                     Grape juice                           Apple juice Coffee or tea                        Jell-O                                      Popsicle                                                Coffee or tea                        Coffee or tea  _____________________________________________________________________   WHAT IS A BLOOD TRANSFUSION? Blood Transfusion Information  A transfusion is the replacement of blood or some of its parts. Blood is made up of multiple cells which provide different functions.  Red blood cells carry oxygen and are used for blood loss replacement.  White blood cells fight against infection.  Platelets control bleeding.  Plasma helps clot blood.  Other blood products are available for specialized needs, such as hemophilia or other clotting disorders. BEFORE THE TRANSFUSION  Who gives blood for transfusions?   Healthy volunteers who are fully evaluated to make sure their blood is safe. This is blood bank blood. Transfusion therapy is the safest it has ever been in the practice of medicine. Before blood is taken from a donor, a complete history is taken to make sure that  person has no history of diseases nor engages in risky social behavior (examples are intravenous drug use or sexual activity with multiple partners). The donor's travel history is screened to minimize risk of transmitting infections, such as malaria. The donated blood is tested for signs of infectious diseases, such as HIV and hepatitis. The blood is then tested to be sure it is compatible with you in order to minimize the chance of a transfusion reaction. If you or a relative donates blood, this is often done in anticipation of surgery and is not appropriate for emergency situations. It takes many days to process the donated blood.  RISKS AND COMPLICATIONS Although transfusion therapy is very safe and saves many lives, the main dangers of transfusion include:   Getting an infectious disease.  Developing a transfusion reaction. This is an allergic reaction to something in the blood you were given. Every precaution is taken to prevent this. The decision to have a blood transfusion has been considered carefully by your caregiver before blood is given. Blood is not given unless the benefits outweigh the risks. AFTER THE TRANSFUSION  Right after receiving a blood transfusion, you will usually feel much better and more energetic. This is especially true if your red blood cells have gotten low (anemic). The transfusion raises the level of the red blood cells which carry oxygen, and this usually causes an energy increase.  The nurse administering the transfusion will monitor you carefully for complications. HOME CARE INSTRUCTIONS  No special instructions are needed after a transfusion. You may find your energy is better. Speak with your caregiver about any limitations on activity for underlying diseases you may have. SEEK MEDICAL CARE IF:   Your condition is not improving after your transfusion.  You develop redness or irritation at the intravenous (IV) site. SEEK IMMEDIATE MEDICAL CARE IF:  Any of the  following symptoms occur over the next 12 hours:  Shaking chills.  You have a temperature by mouth above 102 F (38.9 C), not controlled by medicine.  Chest, back, or muscle pain.  People around you feel you are not acting correctly or are confused.  Shortness of breath or difficulty breathing.  Dizziness and fainting.  You get a rash or develop hives.  You have a decrease in urine output.  Your urine turns a dark color or changes to pink, red, or brown. Any of the following symptoms occur over the next 10 days:  You have a temperature by mouth above 102 F (38.9 C), not controlled by medicine.  Shortness of breath.  Weakness after normal activity.  The white part of the eye turns yellow (jaundice).  You have a decrease in the amount of urine or are urinating less often.  Your urine turns a dark color or changes to pink, red, or brown. Document Released: 07/30/2000 Document Revised: 10/25/2011 Document Reviewed: 03/18/2008 ExitCare Patient Information 2014 [place].  _______________________________________________________________________  Incentive Spirometer  An incentive spirometer is a tool that can help keep your lungs clear and active. This tool measures how well you are filling your lungs with each breath. Taking long deep breaths may help reverse or decrease the chance of developing breathing (pulmonary) problems (especially infection) following:  A long period of time when you are unable to move or be active. BEFORE THE PROCEDURE   If the spirometer includes an indicator to show your best effort, your nurse or respiratory therapist will set it to a desired goal.  If possible, sit up straight or lean slightly forward. Try not to slouch.  Hold the incentive spirometer in an upright position. INSTRUCTIONS FOR USE  1. Sit on the edge of your bed if possible, or sit up as far as you can in bed or on a chair. 2. Hold the incentive spirometer in an upright  position. 3. Breathe out normally. 4. Place the mouthpiece in your mouth and seal your lips tightly around it. 5. Breathe in slowly and as deeply as possible, raising the piston or the ball toward the top of the column. 6. Hold your breath for 3-5 seconds or for as long as possible. Allow the piston  or ball to fall to the bottom of the column. 7. Remove the mouthpiece from your mouth and breathe out normally. 8. Rest for a few seconds and repeat Steps 1 through 7 at least 10 times every 1-2 hours when you are awake. Take your time and take a few normal breaths between deep breaths. 9. The spirometer may include an indicator to show your best effort. Use the indicator as a goal to work toward during each repetition. 10. After each set of 10 deep breaths, practice coughing to be sure your lungs are clear. If you have an incision (the cut made at the time of surgery), support your incision when coughing by placing a pillow or rolled up towels firmly against it. Once you are able to get out of bed, walk around indoors and cough well. You may stop using the incentive spirometer when instructed by your caregiver.  RISKS AND COMPLICATIONS  Take your time so you do not get dizzy or light-headed.  If you are in pain, you may need to take or ask for pain medication before doing incentive spirometry. It is harder to take a deep breath if you are having pain. AFTER USE  Rest and breathe slowly and easily.  It can be helpful to keep track of a log of your progress. Your caregiver can provide you with a simple table to help with this. If you are using the spirometer at home, follow these instructions: [place] IF:   You are having difficultly using the spirometer.  You have trouble using the spirometer as often as instructed.  Your pain medication is not giving enough relief while using the spirometer.  You develop fever of 100.5 F (38.1 C) or higher. SEEK IMMEDIATE MEDICAL CARE IF:    You cough up bloody sputum that had not been present before.  You develop fever of 102 F (38.9 C) or greater.  You develop worsening pain at or near the incision site. MAKE SURE YOU:   Understand these instructions.  Will watch your condition.  Will get help right away if you are not doing well or get worse. Document Released: 12/13/2006 Document Revised: 10/25/2011 Document Reviewed: 02/13/2007 ExitCare Patient Information 2014 ExitCare, [place].   ________________________________________________________________________ How to Manage Your Diabetes Before and After Surgery  Why is it important to control my blood sugar before and after surgery? . Improving blood sugar levels before and after surgery helps healing and can limit problems. . A way of improving blood sugar control is eating a healthy diet by: o  Eating less sugar and carbohydrates o  Increasing activity/exercise o  Talking with your doctor about reaching your blood sugar goals . High blood sugars (greater than 180 mg/dL) can raise your risk of infections and slow your recovery, so you will need to focus on controlling your diabetes during the weeks before surgery. . Make sure that the doctor who takes care of your diabetes knows about your planned surgery including the date and location.  How do I manage my blood sugar before surgery? . Check your blood sugar at least 4 times a day, starting 2 days before surgery, to make sure that the level is not too high or low. o Check your blood sugar the morning of your surgery when you wake up and every 2 hours until you get to the Short Stay unit. . If your blood sugar is less than 70 mg/dL, you will need to treat for low blood sugar: o Do not  take insulin. o Treat a low blood sugar (less than 70 mg/dL) with  cup of clear juice (cranberry or apple), 4 glucose tablets, OR glucose gel. o Recheck blood sugar in 15 minutes after treatment (to make sure it is greater than 70  mg/dL). If your blood sugar is not greater than 70 mg/dL on recheck, call [phone] for further instructions. . Report your blood sugar to the short stay nurse when you get to Short Stay.  . If you are admitted to the hospital after surgery: o Your blood sugar will be checked by the staff and you will probably be given insulin after surgery (instead of oral diabetes medicines) to make sure you have good blood sugar levels. o The goal for blood sugar control after surgery is 80-180 mg/dL.   WHAT DO I DO ABOUT MY DIABETES MEDICATION?  Marland Kitchen Do not take oral diabetes medicines (pills) the morning of surgery.  . .      .     Patient Signature:  Date:   Nurse Signature:  Date:   Reviewed and Endorsed by [facility] Patient Education Committee, August 2015

## 2016-11-05 ENCOUNTER — Encounter (HOSPITAL_COMMUNITY): Payer: Self-pay

## 2016-11-05 ENCOUNTER — Encounter (HOSPITAL_COMMUNITY)
Admission: RE | Admit: 2016-11-05 | Discharge: 2016-11-05 | Disposition: A | Discharge status: Home / Self Care | Payer: BLUE CROSS/BLUE SHIELD | Source: Ambulatory Visit | Attending: Gynecologic Oncology | Admitting: Gynecologic Oncology

## 2016-11-05 ENCOUNTER — Telehealth: Payer: Self-pay | Admitting: *Deleted

## 2016-11-05 DIAGNOSIS — Z01810 Encounter for preprocedural cardiovascular examination: Secondary | ICD-10-CM | POA: Insufficient documentation

## 2016-11-05 DIAGNOSIS — Z01812 Encounter for preprocedural laboratory examination: Secondary | ICD-10-CM | POA: Insufficient documentation

## 2016-11-05 HISTORY — DX: Anxiety disorder, unspecified: F41.9

## 2016-11-05 HISTORY — DX: Unspecified osteoarthritis, unspecified site: M19.90

## 2016-11-05 LAB — COMPREHENSIVE METABOLIC PANEL
ALT: 16 U/L (ref 14–54)
AST: 16 U/L (ref 15–41)
Albumin: 4.1 g/dL (ref 3.5–5.0)
Alkaline Phosphatase: 96 U/L (ref 38–126)
Anion gap: 5 (ref 5–15)
BILIRUBIN TOTAL: 1 mg/dL (ref 0.3–1.2)
BUN: 10 mg/dL (ref 6–20)
CO2: 26 mmol/L (ref 22–32)
CREATININE: 0.66 mg/dL (ref 0.44–1.00)
Calcium: 9.2 mg/dL (ref 8.9–10.3)
Chloride: 108 mmol/L (ref 101–111)
GFR calc Af Amer: >60 mL/min (ref >60)
Glucose, Bld: 123 mg/dL — ABNORMAL HIGH (ref 65–99)
Potassium: 3.9 mmol/L (ref 3.5–5.1)
Sodium: 139 mmol/L (ref 135–145)
Total Protein: 7.5 g/dL (ref 6.5–8.1)

## 2016-11-05 LAB — CBC
HEMATOCRIT: 41.2 % (ref 36.0–46.0)
Hemoglobin: 13.9 g/dL (ref 12.0–15.0)
MCH: 29.6 pg (ref 26.0–34.0)
MCHC: 33.7 g/dL (ref 30.0–36.0)
MCV: 87.7 fL (ref 78.0–100.0)
Platelets: 256 10*3/uL (ref 150–400)
RBC: 4.7 MIL/uL (ref 3.87–5.11)
RDW: 13.2 % (ref 11.5–15.5)
WBC: 7.1 10*3/uL (ref 4.0–10.5)

## 2016-11-05 LAB — HCG, SERUM, QUALITATIVE: Preg, Serum: NEGATIVE

## 2016-11-05 LAB — TYPE AND SCREEN
ABO/RH(D): O POS
Antibody Screen: NEGATIVE

## 2016-11-05 LAB — GLUCOSE, CAPILLARY: Glucose-Capillary: 128 mg/dL — ABNORMAL HIGH (ref 65–99)

## 2016-11-05 LAB — ABO/RH: ABO/RH(D): O POS

## 2016-11-05 NOTE — Telephone Encounter (Signed)
Received a voicemail message from Sully from [place] regarding this patient. The call back number is [phone] ext [phone] or ext [phone], call 7-5 MTN time. I have forwarded the message to [place] in [place].

## 2016-11-05 NOTE — Progress Notes (Signed)
HGBA1C done-09/22/2016-in epic.

## 2016-11-05 NOTE — Progress Notes (Signed)
EKG from 11/05/2016 and EKG from 2016 ( both on chart) shown to Dr Smith Robert ( anesthesia).  NO new orders given.

## 2016-11-08 NOTE — Progress Notes (Signed)
Final EKG done 11/05/16-epic

## 2016-11-16 ENCOUNTER — Ambulatory Visit (HOSPITAL_COMMUNITY): Payer: BLUE CROSS/BLUE SHIELD | Admitting: Registered Nurse

## 2016-11-16 ENCOUNTER — Ambulatory Visit (HOSPITAL_COMMUNITY)
Admission: RE | Admit: 2016-11-16 | Discharge: 2016-11-17 | Disposition: A | Discharge status: Home / Self Care | Payer: BLUE CROSS/BLUE SHIELD | Source: Ambulatory Visit | Attending: Gynecologic Oncology | Admitting: Gynecologic Oncology

## 2016-11-16 ENCOUNTER — Encounter (HOSPITAL_COMMUNITY)
Admission: RE | Disposition: A | Discharge status: Home / Self Care | Payer: Self-pay | Source: Ambulatory Visit | Attending: Gynecologic Oncology

## 2016-11-16 ENCOUNTER — Encounter (HOSPITAL_COMMUNITY): Payer: Self-pay | Admitting: *Deleted

## 2016-11-16 DIAGNOSIS — C541 Malignant neoplasm of endometrium: Secondary | ICD-10-CM | POA: Diagnosis not present

## 2016-11-16 DIAGNOSIS — F17210 Nicotine dependence, cigarettes, uncomplicated: Secondary | ICD-10-CM | POA: Insufficient documentation

## 2016-11-16 DIAGNOSIS — F411 Generalized anxiety disorder: Secondary | ICD-10-CM | POA: Diagnosis not present

## 2016-11-16 DIAGNOSIS — Z7984 Long term (current) use of oral hypoglycemic drugs: Secondary | ICD-10-CM | POA: Insufficient documentation

## 2016-11-16 DIAGNOSIS — E119 Type 2 diabetes mellitus without complications: Secondary | ICD-10-CM | POA: Insufficient documentation

## 2016-11-16 DIAGNOSIS — M549 Dorsalgia, unspecified: Secondary | ICD-10-CM | POA: Diagnosis not present

## 2016-11-16 DIAGNOSIS — Z79899 Other long term (current) drug therapy: Secondary | ICD-10-CM | POA: Diagnosis not present

## 2016-11-16 DIAGNOSIS — F419 Anxiety disorder, unspecified: Secondary | ICD-10-CM | POA: Insufficient documentation

## 2016-11-16 DIAGNOSIS — R1900 Intra-abdominal and pelvic swelling, mass and lump, unspecified site: Secondary | ICD-10-CM

## 2016-11-16 HISTORY — PX: SENTINEL NODE BIOPSY: SHX6608

## 2016-11-16 HISTORY — PX: ROBOTIC ASSISTED TOTAL HYSTERECTOMY WITH BILATERAL SALPINGO OOPHERECTOMY: SHX6086

## 2016-11-16 LAB — GLUCOSE, CAPILLARY
GLUCOSE-CAPILLARY: 241 mg/dL — AB (ref 65–99)
Glucose-Capillary: 158 mg/dL — ABNORMAL HIGH (ref 65–99)
Glucose-Capillary: 192 mg/dL — ABNORMAL HIGH (ref 65–99)
Glucose-Capillary: 277 mg/dL — ABNORMAL HIGH (ref 65–99)

## 2016-11-16 LAB — CBC
HEMATOCRIT: 38.3 % (ref 36.0–46.0)
HEMOGLOBIN: 12.6 g/dL (ref 12.0–15.0)
MCH: 28.7 pg (ref 26.0–34.0)
MCHC: 32.9 g/dL (ref 30.0–36.0)
MCV: 87.2 fL (ref 78.0–100.0)
Platelets: 245 10*3/uL (ref 150–400)
RBC: 4.39 MIL/uL (ref 3.87–5.11)
RDW: 13.2 % (ref 11.5–15.5)
WBC: 11.7 10*3/uL — ABNORMAL HIGH (ref 4.0–10.5)

## 2016-11-16 LAB — CREATININE, SERUM
Creatinine, Ser: 0.71 mg/dL (ref 0.44–1.00)
GFR calc Af Amer: >60 mL/min (ref >60)

## 2016-11-16 SURGERY — HYSTERECTOMY, TOTAL, ROBOT-ASSISTED, LAPAROSCOPIC, WITH BILATERAL SALPINGO-OOPHORECTOMY
Anesthesia: General

## 2016-11-16 MED ORDER — STERILE WATER FOR INJECTION IJ SOLN
INTRAMUSCULAR | Status: AC
  Filled 2016-11-16: qty 10

## 2016-11-16 MED ORDER — HYDROMORPHONE HCL 1 MG/ML IJ SOLN
0.2000 mg | INTRAMUSCULAR | Status: DC | PRN
  Administered 2016-11-16: 0.6 mg via INTRAVENOUS
  Filled 2016-11-16: qty 1

## 2016-11-16 MED ORDER — SUGAMMADEX SODIUM 500 MG/5ML IV SOLN
INTRAVENOUS | Status: AC
  Filled 2016-11-16: qty 5

## 2016-11-16 MED ORDER — ESCITALOPRAM OXALATE 20 MG PO TABS: 10.0000 mg | ORAL_TABLET | Freq: Every day | ORAL | Status: DC

## 2016-11-16 MED ORDER — OXYCODONE-ACETAMINOPHEN 5-325 MG PO TABS
1.0000 | ORAL_TABLET | ORAL | Status: DC | PRN
  Administered 2016-11-16: 1 via ORAL
  Administered 2016-11-17: 2 via ORAL
  Filled 2016-11-16: qty 1
  Filled 2016-11-16: qty 2

## 2016-11-16 MED ORDER — PROPOFOL 10 MG/ML IV BOLUS
INTRAVENOUS | Status: AC
  Filled 2016-11-16: qty 40

## 2016-11-16 MED ORDER — SUGAMMADEX SODIUM 200 MG/2ML IV SOLN
INTRAVENOUS | Status: DC | PRN
  Administered 2016-11-16: 300 mg via INTRAVENOUS

## 2016-11-16 MED ORDER — ARTIFICIAL TEARS OP OINT
TOPICAL_OINTMENT | OPHTHALMIC | Status: AC
  Filled 2016-11-16: qty 3.5

## 2016-11-16 MED ORDER — DEXAMETHASONE SODIUM PHOSPHATE 10 MG/ML IJ SOLN
INTRAMUSCULAR | Status: DC | PRN
  Administered 2016-11-16: 10 mg via INTRAVENOUS

## 2016-11-16 MED ORDER — MIDAZOLAM HCL 2 MG/2ML IJ SOLN
INTRAMUSCULAR | Status: AC
  Filled 2016-11-16: qty 2

## 2016-11-16 MED ORDER — LACTATED RINGERS IR SOLN
Status: DC | PRN
  Administered 2016-11-16: 1000 mL

## 2016-11-16 MED ORDER — EPHEDRINE 5 MG/ML INJ
INTRAVENOUS | Status: AC
  Filled 2016-11-16: qty 10

## 2016-11-16 MED ORDER — BUPIVACAINE HCL (PF) 0.25 % IJ SOLN
INTRAMUSCULAR | Status: AC
  Filled 2016-11-16: qty 30

## 2016-11-16 MED ORDER — GABAPENTIN 300 MG PO CAPS
600.0000 mg | ORAL_CAPSULE | Freq: Every day | ORAL | Status: AC
  Administered 2016-11-16: 600 mg via ORAL
  Filled 2016-11-16: qty 2

## 2016-11-16 MED ORDER — FENTANYL CITRATE (PF) 100 MCG/2ML IJ SOLN
INTRAMUSCULAR | Status: DC | PRN
  Administered 2016-11-16 (×2): 50 ug via INTRAVENOUS
  Administered 2016-11-16: 150 ug via INTRAVENOUS

## 2016-11-16 MED ORDER — ONDANSETRON HCL 4 MG PO TABS: 4.0000 mg | ORAL_TABLET | Freq: Four times a day (QID) | ORAL | Status: DC | PRN

## 2016-11-16 MED ORDER — HYDROMORPHONE HCL 1 MG/ML IJ SOLN
INTRAMUSCULAR | Status: AC
  Filled 2016-11-16: qty 1

## 2016-11-16 MED ORDER — ONDANSETRON HCL 4 MG/2ML IJ SOLN
INTRAMUSCULAR | Status: DC | PRN
  Administered 2016-11-16: 4 mg via INTRAVENOUS

## 2016-11-16 MED ORDER — CEFAZOLIN SODIUM-DEXTROSE 2-4 GM/100ML-% IV SOLN
2.0000 g | INTRAVENOUS | Status: AC
  Administered 2016-11-16: 2 g via INTRAVENOUS
  Filled 2016-11-16: qty 100

## 2016-11-16 MED ORDER — ROCURONIUM BROMIDE 10 MG/ML (PF) SYRINGE
PREFILLED_SYRINGE | INTRAVENOUS | Status: DC | PRN
  Administered 2016-11-16: 50 mg via INTRAVENOUS
  Administered 2016-11-16: 20 mg via INTRAVENOUS

## 2016-11-16 MED ORDER — BUPIVACAINE HCL (PF) 0.25 % IJ SOLN
INTRAMUSCULAR | Status: DC | PRN
  Administered 2016-11-16: 10 mL

## 2016-11-16 MED ORDER — EPHEDRINE SULFATE-NACL 50-0.9 MG/10ML-% IV SOSY
PREFILLED_SYRINGE | INTRAVENOUS | Status: DC | PRN
Start: 2016-11-16 — End: 2016-11-16
  Administered 2016-11-16: 20 mg via INTRAVENOUS

## 2016-11-16 MED ORDER — INSULIN ASPART 100 UNIT/ML ~~LOC~~ SOLN
0.0000 [IU] | Freq: Three times a day (TID) | SUBCUTANEOUS | Status: DC
  Administered 2016-11-16: 5 [IU] via SUBCUTANEOUS
  Administered 2016-11-17: 2 [IU] via SUBCUTANEOUS

## 2016-11-16 MED ORDER — DEXAMETHASONE SODIUM PHOSPHATE 10 MG/ML IJ SOLN
INTRAMUSCULAR | Status: AC
  Filled 2016-11-16: qty 1

## 2016-11-16 MED ORDER — LIDOCAINE 2% (20 MG/ML) 5 ML SYRINGE
INTRAMUSCULAR | Status: AC
  Filled 2016-11-16: qty 5

## 2016-11-16 MED ORDER — ENOXAPARIN SODIUM 40 MG/0.4ML ~~LOC~~ SOLN
40.0000 mg | SUBCUTANEOUS | Status: AC
  Administered 2016-11-16: 40 mg via SUBCUTANEOUS
  Filled 2016-11-16: qty 0.4

## 2016-11-16 MED ORDER — PROPOFOL 10 MG/ML IV BOLUS
INTRAVENOUS | Status: DC | PRN
  Administered 2016-11-16: 150 mg via INTRAVENOUS

## 2016-11-16 MED ORDER — LIDOCAINE 2% (20 MG/ML) 5 ML SYRINGE
INTRAMUSCULAR | Status: DC | PRN
  Administered 2016-11-16: 100 mg via INTRAVENOUS

## 2016-11-16 MED ORDER — ENOXAPARIN SODIUM 40 MG/0.4ML ~~LOC~~ SOLN
40.0000 mg | SUBCUTANEOUS | Status: DC
  Administered 2016-11-17: 40 mg via SUBCUTANEOUS
  Filled 2016-11-16: qty 0.4

## 2016-11-16 MED ORDER — STERILE WATER FOR IRRIGATION IR SOLN
Status: DC | PRN
  Administered 2016-11-16: 1000 mL

## 2016-11-16 MED ORDER — ONDANSETRON HCL 4 MG/2ML IJ SOLN
INTRAMUSCULAR | Status: AC
  Filled 2016-11-16: qty 2

## 2016-11-16 MED ORDER — DICLOFENAC SODIUM 50 MG PO TBEC
50.0000 mg | DELAYED_RELEASE_TABLET | Freq: Three times a day (TID) | ORAL | Status: DC
  Administered 2016-11-16: 50 mg via ORAL
  Filled 2016-11-16 (×4): qty 1

## 2016-11-16 MED ORDER — LACTATED RINGERS IV SOLN
INTRAVENOUS | Status: DC
  Administered 2016-11-16 (×2): via INTRAVENOUS

## 2016-11-16 MED ORDER — ONDANSETRON HCL 4 MG/2ML IJ SOLN: 4.0000 mg | Freq: Once | INTRAMUSCULAR | Status: DC | PRN

## 2016-11-16 MED ORDER — KCL IN DEXTROSE-NACL 20-5-0.45 MEQ/L-%-% IV SOLN
INTRAVENOUS | Status: DC
Start: 2016-11-16 — End: 2016-11-17
  Administered 2016-11-16: 18:00:00 via INTRAVENOUS
  Filled 2016-11-16 (×3): qty 1000

## 2016-11-16 MED ORDER — ROCURONIUM BROMIDE 50 MG/5ML IV SOSY
PREFILLED_SYRINGE | INTRAVENOUS | Status: AC
  Filled 2016-11-16: qty 5

## 2016-11-16 MED ORDER — ONDANSETRON HCL 4 MG/2ML IJ SOLN: 4.0000 mg | Freq: Four times a day (QID) | INTRAMUSCULAR | Status: DC | PRN

## 2016-11-16 MED ORDER — MIDAZOLAM HCL 5 MG/5ML IJ SOLN
INTRAMUSCULAR | Status: DC | PRN
Start: 2016-11-16 — End: 2016-11-16
  Administered 2016-11-16: 2 mg via INTRAVENOUS

## 2016-11-16 MED ORDER — ROCURONIUM BROMIDE 50 MG/5ML IV SOSY
PREFILLED_SYRINGE | INTRAVENOUS | Status: AC
  Filled 2016-11-16: qty 10

## 2016-11-16 MED ORDER — HYDROMORPHONE HCL 1 MG/ML IJ SOLN
0.2500 mg | INTRAMUSCULAR | Status: DC | PRN
  Administered 2016-11-16 (×3): 0.5 mg via INTRAVENOUS

## 2016-11-16 MED ORDER — MEPERIDINE HCL 50 MG/ML IJ SOLN: 6.2500 mg | INTRAMUSCULAR | Status: DC | PRN

## 2016-11-16 MED ORDER — FENTANYL CITRATE (PF) 250 MCG/5ML IJ SOLN
INTRAMUSCULAR | Status: AC
  Filled 2016-11-16: qty 5

## 2016-11-16 SURGICAL SUPPLY — 60 items
APPLICATOR SURGIFLO ENDO (HEMOSTASIS) IMPLANT
BAG LAPAROSCOPIC 12 15 PORT 16 (BASKET) IMPLANT
BAG RETRIEVAL 12/15 (BASKET)
CHLORAPREP W/TINT 26ML (MISCELLANEOUS) ×3 IMPLANT
COVER BACK TABLE 60X90IN (DRAPES) ×3 IMPLANT
COVER SURGICAL LIGHT HANDLE (MISCELLANEOUS) ×3 IMPLANT
COVER TIP SHEARS 8 DVNC (MISCELLANEOUS) ×2 IMPLANT
COVER TIP SHEARS 8MM DA VINCI (MISCELLANEOUS) ×1
DERMABOND ADVANCED (GAUZE/BANDAGES/DRESSINGS) ×1
DERMABOND ADVANCED .7 DNX12 (GAUZE/BANDAGES/DRESSINGS) ×2 IMPLANT
DRAPE ARM DVNC X/XI (DISPOSABLE) ×8 IMPLANT
DRAPE COLUMN DVNC XI (DISPOSABLE) ×2 IMPLANT
DRAPE DA VINCI XI ARM (DISPOSABLE) ×4
DRAPE DA VINCI XI COLUMN (DISPOSABLE) ×1
DRAPE SHEET LG 3/4 BI-LAMINATE (DRAPES) ×9 IMPLANT
DRAPE SURG IRRIG POUCH 19X23 (DRAPES) ×3 IMPLANT
ELECT REM PT RETURN 15FT ADLT (MISCELLANEOUS) ×3 IMPLANT
GLOVE BIO SURGEON STRL SZ 6 (GLOVE) ×12 IMPLANT
GLOVE BIO SURGEON STRL SZ 6.5 (GLOVE) ×6 IMPLANT
GOWN STRL REUS W/ TWL LRG LVL3 (GOWN DISPOSABLE) ×4 IMPLANT
GOWN STRL REUS W/TWL LRG LVL3 (GOWN DISPOSABLE) ×2
HOLDER FOLEY CATH W/STRAP (MISCELLANEOUS) ×3 IMPLANT
IRRIG SUCT STRYKERFLOW 2 WTIP (MISCELLANEOUS) ×3
IRRIGATION SUCT STRKRFLW 2 WTP (MISCELLANEOUS) ×2 IMPLANT
KIT BASIN OR (CUSTOM PROCEDURE TRAY) ×3 IMPLANT
KIT PROCEDURE DA VINCI SI (MISCELLANEOUS)
KIT PROCEDURE DVNC SI (MISCELLANEOUS) IMPLANT
MANIPULATOR UTERINE 4.5 ZUMI (MISCELLANEOUS) ×3 IMPLANT
MARKER SKIN DUAL TIP RULER LAB (MISCELLANEOUS) ×3 IMPLANT
NDL SAFETY ECLIPSE 18X1.5 (NEEDLE) ×2 IMPLANT
NEEDLE HYPO 18GX1.5 SHARP (NEEDLE) ×1
NEEDLE SPNL 18GX3.5 QUINCKE PK (NEEDLE) ×3 IMPLANT
OBTURATOR OPTICAL STANDARD 8MM (TROCAR) ×1
OBTURATOR OPTICAL STND 8 DVNC (TROCAR) ×2
OBTURATOR OPTICALSTD 8 DVNC (TROCAR) ×2 IMPLANT
OCCLUDER COLPOPNEUMO (BALLOONS) ×3 IMPLANT
PAD POSITIONING PINK XL (MISCELLANEOUS) ×3 IMPLANT
PORT ACCESS TROCAR AIRSEAL 12 (TROCAR) ×2 IMPLANT
PORT ACCESS TROCAR AIRSEAL 5M (TROCAR) ×1
POUCH SPECIMEN RETRIEVAL 10MM (ENDOMECHANICALS) IMPLANT
SEAL CANN UNIV 5-8 DVNC XI (MISCELLANEOUS) ×8 IMPLANT
SEAL XI 5MM-8MM UNIVERSAL (MISCELLANEOUS) ×4
SET TRI-LUMEN FLTR TB AIRSEAL (TUBING) ×3 IMPLANT
SHEET LAVH (DRAPES) ×3 IMPLANT
SOLUTION ELECTROLUBE (MISCELLANEOUS) ×3 IMPLANT
SURGIFLO W/THROMBIN 8M KIT (HEMOSTASIS) IMPLANT
SUT MNCRL AB 4-0 PS2 18 (SUTURE) ×6 IMPLANT
SUT VIC AB 0 CT1 27 (SUTURE) ×1
SUT VIC AB 0 CT1 27XBRD ANTBC (SUTURE) ×2 IMPLANT
SYR 10ML LL (SYRINGE) ×3 IMPLANT
SYR 50ML LL SCALE MARK (SYRINGE) ×3 IMPLANT
TOWEL OR 17X26 10 PK STRL BLUE (TOWEL DISPOSABLE) ×6 IMPLANT
TOWEL OR NON WOVEN STRL DISP B (DISPOSABLE) ×3 IMPLANT
TRAP SPECIMEN MUCOUS 40CC (MISCELLANEOUS) IMPLANT
TRAY FOLEY W/METER SILVER 16FR (SET/KITS/TRAYS/PACK) ×3 IMPLANT
TRAY LAPAROSCOPIC (CUSTOM PROCEDURE TRAY) ×3 IMPLANT
TROCAR BLADELESS OPT 5 100 (ENDOMECHANICALS) ×3 IMPLANT
UNDERPAD 30X30 (UNDERPADS AND DIAPERS) ×3 IMPLANT
UNDERPAD 30X30 INCONTINENT (UNDERPADS AND DIAPERS) ×3 IMPLANT
WATER STERILE IRR 1500ML POUR (IV SOLUTION) IMPLANT

## 2016-11-16 NOTE — Progress Notes (Signed)
Patient placed on holding

## 2016-11-16 NOTE — H&P (View-Only) (Signed)
Consult Note: Gyn-Onc  Consult was requested by Dr. Radene Knee for the evaluation of [name] 51 y.o. female  CC:  Chief Complaint  Patient presents with  . Pelvic Mass    Assessment/Plan:  [name]  is a 51 y.o.  year old with grade 1 endometrioid endometrial adenocarcinoma.  A detailed discussion was held with the patient and her family with regard to to her endometrial cancer diagnosis. We discussed the standard management options for uterine cancer which includes surgery followed possibly by adjuvant therapy depending on the results of surgery. The options for surgical management include a hysterectomy and removal of the tubes and ovaries possibly with removal of pelvic and para-aortic lymph nodes.If feasible, a minimally invasive approach including a robotic hysterectomy or laparoscopic hysterectomy have benefits including shorter hospital stay, recovery time and better wound healing than with open surgery. The patient has been counseled about these surgical options and the risks of surgery in general including infection, bleeding, damage to surrounding structures (including bowel, bladder, ureters, nerves or vessels), and the postoperative risks of PE/ DVT, and lymphedema. I extensively reviewed the additional risks of robotic hysterectomy including possible need for conversion to open laparotomy.  I discussed positioning during surgery of trendelenberg and risks of minor facial swelling and care we take in preoperative positioning.  I discussed that she has increased risks related to pulmonary issues and healing and infection due to her history of being a smoker. After counseling and consideration of her options, she desires to proceed with robotic assisted total hysterectomy with bilateral sapingo-oophorectomy and SLN biopsy.  She has a narrow vagina and it is possible that she may require a minilap for specimen delivery.  She will be seen by anesthesia for preoperative clearance  and discussion of postoperative pain management.  She was given the opportunity to ask questions, which were answered to her satisfaction, and she is agreement with the above mentioned plan of care.   HPI: [name] is a 51 year old G0 who is seen in consultation at the request of Dr Radene Knee for grade 1 endometrial cancer.  The patient reported post-menopausal bleeding to her physician who performed a TVUS on 10/13/16. This showed a uterus measuring 7.6x4.6x 6.26cm.  The endometrial lining measuringed 2.24cm. The ovaries were normal. A 4cm intracavitary mass was appreciated on [place].  The patient then underwent endometrial pipelle biopsy on 10/13/16. It revealed an endometrioid adenocarcinoma, (FIGO grade 1).   Current Meds:  Outpatient Encounter Prescriptions as of 10/28/2016  Medication Sig  . cyclobenzaprine (FLEXERIL) 10 MG tablet Take 10 mg by mouth as needed for muscle spasms.  Marland Kitchen escitalopram (LEXAPRO) 10 MG tablet Take 1 tablet (10 mg total) by mouth daily.  . metFORMIN (GLUCOPHAGE-XR) 500 MG 24 hr tablet 1/2 po QD  . Vitamin D, Ergocalciferol, (DRISDOL) 50000 units CAPS capsule Take 1 capsule (50,000 Units total) by mouth once a week.   No facility-administered encounter medications on file as of 10/28/2016.     Allergy: No Known Allergies  Social Hx:   Social History   Social History  . Marital status: Married    Spouse name: N/A  . Number of children: N/A  . Years of education: N/A   Occupational History  . Not on file.   Social History Main Topics  . Smoking status: Current Every Day Smoker    Packs/day: 1.00    Years: 35.00    Types: Cigarettes  . Smokeless tobacco: Never Used  . Alcohol use  No  . Drug use: No  . Sexual activity: Yes   Other Topics Concern  . Not on file   Social History Narrative  . No narrative on file    Past Surgical Hx:  Past Surgical History:  Procedure Laterality Date  . BASAL CELL CARCINOMA EXCISION     face  . GANGLION CYST  EXCISION Left 08/06/2014   Procedure: EXCISION LEFT  WRIST GANGLION;  Surgeon: Leanora Cover, MD;  Location: [facility];  Service: Orthopedics;  Laterality: Left;  . KNEE SURGERY     right x3  . TOOTH EXTRACTION      Past Medical Hx:  Past Medical History:  Diagnosis Date  . Basal cell carcinoma    face  . Diabetes mellitus without complication ([place])   . History of bronchitis     Past Gynecological History:  G0 No LMP recorded. Patient is not currently having periods (Reason: Premenopausal).  Family Hx:  Family History  Problem Relation Age of Onset  . Hypertension Mother   . Heart disease Father   . Arthritis Sister     rheumatoid  . Cancer Brother     prostate    Review of Systems:  Constitutional  Feels well,    ENT Normal appearing ears and nares bilaterally Skin/Breast  No rash, sores, jaundice, itching, dryness Cardiovascular  No chest pain, shortness of breath, or edema  Pulmonary  No cough or wheeze.  Gastro Intestinal  No nausea, vomitting, or diarrhoea. No bright red blood per rectum, no abdominal pain, change in bowel movement, or constipation.  Genito Urinary  No frequency, urgency, dysuria, + postmenopausal bleeding Musculo Skeletal  No myalgia, arthralgia, joint swelling or pain  Neurologic  No weakness, numbness, change in gait,  Psychology  No depression, anxiety, insomnia.   Vitals:  Blood pressure (!) 154/76, pulse 87, temperature 97.7 F (36.5 C), temperature source Oral, resp. rate (!) 21, height 5\' 5"  (1.651 m), weight 164 lb 2 oz (74.4 kg), SpO2 98 %.  Physical Exam: WD in NAD Neck  Supple NROM, without any enlargements.  Lymph Node Survey No cervical supraclavicular or inguinal adenopathy Cardiovascular  Pulse normal rate, regularity and rhythm. S1 and S2 normal.  Lungs  Clear to auscultation bilateraly, without wheezes/crackles/rhonchi. Good air movement.  Skin  No rash/lesions/breakdown  Psychiatry  Alert and  oriented to person, place, and time  Abdomen  Normoactive bowel sounds, abdomen soft, non-tender and thin without evidence of hernia.  Back No CVA tenderness Genito Urinary  Vulva/vagina: Normal external female genitalia.   No lesions. No discharge or bleeding.  Bladder/urethra:  No lesions or masses, well supported bladder  Vagina: narrow, no lesions  Cervix: Normal appearing, no lesions.  Uterus:  Small, mobile, no parametrial involvement or nodularity.  Adnexa: no palpable masses. Rectal  deferred Extremities  No bilateral cyanosis, clubbing or edema.   Donaciano Eva, MD  10/28/2016, 3:51 PM

## 2016-11-16 NOTE — Interval H&P Note (Signed)
History and Physical Interval Note:  11/16/2016 9:14 AM  [name]  has presented today for surgery, with the diagnosis of PELVIC MASS  The various methods of treatment have been discussed with the patient and family. After consideration of risks, benefits and other options for treatment, the patient has consented to  Procedure(s): XI ROBOTIC ASSISTED TOTAL HYSTERECTOMY WITH BILATERAL SALPINGO OOPHORECTOMY (Bilateral) SENTINEL NODE BIOPSY (N/A) as a surgical intervention .  The patient's history has been reviewed, patient examined, no change in status, stable for surgery.  I have reviewed the patient's chart and labs.  Questions were answered to the patient's satisfaction.     Donaciano Eva

## 2016-11-16 NOTE — Op Note (Signed)
OPERATIVE NOTE 11/16/16  Surgeon: Donaciano Eva   Assistants: Dr Lahoma Crocker (an MD assistant was necessary for tissue manipulation, management of robotic instrumentation, retraction and positioning due to the complexity of the case and hospital policies).   Anesthesia: General endotracheal anesthesia  ASA Class: 3   Pre-operative Diagnosis: endometrial cancer (grade 1)  Post-operative Diagnosis: same as preop  Operation: Robotic-assisted laparoscopic total hysterectomy with bilateral salpingoophorectomy with bilateral SLN biopsy  Surgeon: Donaciano Eva  Assistant Surgeon: Lahoma Crocker MD  Anesthesia: GET  Urine Output: 300cc  Operative Findings:  : 7cm normal appearing uterus, normal appearing tubes and ovaries, no suspicious nodes.  Estimated Blood Loss:  <25cc      Total IV Fluids: 800 ml         Specimens: right obturator SLN, right external iliac SLN, left obturator SLN, uterus, cervix, bilateral tubes and ovaries.         Complications:  None; patient tolerated the procedure well.         Disposition: PACU - hemodynamically stable.  Procedure Details  The patient was seen in the Holding Room. The risks, benefits, complications, treatment options, and expected outcomes were discussed with the patient.  The patient concurred with the proposed plan, giving informed consent.  The site of surgery properly noted/marked. The patient was identified as [name] and the procedure verified as a Robotic-assisted hysterectomy with bilateral salpingo oophorectomy. A Time Out was held and the above information confirmed.  After induction of anesthesia, the patient was draped and prepped in the usual sterile manner. Pt was placed in supine position after anesthesia and draped and prepped in the usual sterile manner. The abdominal drape was placed after the CholoraPrep had been allowed to dry for 3 minutes.  Her arms were tucked to her side with all  appropriate precautions.  The shoulders were stabilized with padded shoulder blocks applied to the acromium processes.  The patient was placed in the semi-lithotomy position in [place].  The perineum was prepped with Betadine. The patient was then prepped. Foley catheter was placed.  A sterile speculum was placed in the vagina.  The cervix was grasped with a single-tooth tenaculum and dilated with Kennon Rounds dilators. 1mg  total of ICG was injected into the cervical stroma at 2 and 9 o'clock at a 31mm depth (concentration 0..5mg /ml).   The ZUMI uterine manipulator with a medium colpotomizer ring was placed without difficulty.  A pneum occluder balloon was placed over the manipulator.  OG tube placement was confirmed and to suction.   Next, a 5 mm skin incision was made 1 cm below the subcostal margin in the midclavicular line.  The 5 mm Optiview port and scope was used for direct entry.  Opening pressure was under 10 mm CO2.  The abdomen was insufflated and the findings were noted as above.   At this point and all points during the procedure, the patient's intra-abdominal pressure did not exceed 15 mmHg. Next, a 10 mm skin incision was made in the umbilicus and a right and left port was placed about 10 cm lateral to the robot port on the right and left side.  A fourth arm was placed in the left lower quadrant 2 cm above and superior and medial to the anterior superior iliac spine.  All ports were placed under direct visualization.  The patient was placed in steep Trendelenburg.  Bowel was folded away into the upper abdomen.  The robot was docked in the normal  manner.  The right and left peritoneum were opened parallel to the IP ligament to open the retroperitoneal spaces bilaterally. The SLN mapping was performed in bilateral pelvic basins. The para rectal and paravesical spaces were opened up. Lymphatic channels were identified travelling to the following visualized sentinel lymph node's: right obturator and  external iliac SLN and left obturator SLN. These SLN's were separated from their surrounding lymphatic tissue, removed and sent for permanent pathology.  The hysterectomy was started after the round ligament on the right side was incised and the retroperitoneum was entered and the pararectal space was developed.  The ureter was noted to be on the medial leaf of the broad ligament.  The peritoneum above the ureter was incised and stretched and the infundibulopelvic ligament was skeletonized, cauterized and cut.  The posterior peritoneum was taken down to the level of the KOH ring.  The anterior peritoneum was also taken down.  The bladder flap was created to the level of the KOH ring.  The uterine artery on the right side was skeletonized, cauterized and cut in the normal manner.  A similar procedure was performed on the left.  The colpotomy was made and the uterus, cervix, bilateral ovaries and tubes were amputated and delivered through the vagina.  Pedicles were inspected and excellent hemostasis was achieved.    The colpotomy at the vaginal cuff was closed with Vicryl on a CT1 needle in a running manner.  Irrigation was used and excellent hemostasis was achieved.  At this point in the procedure was completed.  Robotic instruments were removed under direct visulaization.  The robot was undocked. The 10 mm ports were closed with Vicryl on a UR-5 needle and the fascia was closed with 0 Vicryl on a UR-5 needle.  The skin was closed with 4-0 Vicryl in a subcuticular manner.  Dermabond was applied.  Sponge, lap and needle counts correct x 2.  The patient was taken to the recovery room in stable condition.  The vagina was swabbed with  minimal bleeding noted.   All instrument and needle counts were correct x  3.   The patient was transferred to the recovery room in a stable condition.  Donaciano Eva, MD

## 2016-11-16 NOTE — Anesthesia Preprocedure Evaluation (Signed)
Anesthesia Evaluation  Patient identified by MRN, date of birth, ID band Patient awake    Reviewed: Allergy & Precautions, NPO status , Patient's Chart, lab work & pertinent test results  Airway Mallampati: I  TM Distance: >3 FB Neck ROM: Full    Dental   Pulmonary Current Smoker,    Pulmonary exam normal        Cardiovascular Normal cardiovascular exam     Neuro/Psych Anxiety    GI/Hepatic   Endo/Other  diabetes, Type 2, Oral Hypoglycemic Agents  Renal/GU      Musculoskeletal   Abdominal   Peds  Hematology   Anesthesia Other Findings   Reproductive/Obstetrics                             Anesthesia Physical Anesthesia Plan  ASA: II  Anesthesia Plan: General   Post-op Pain Management:    Induction: Intravenous  Airway Management Planned: Oral ETT  Additional Equipment:   Intra-op Plan:   Post-operative Plan: Extubation in OR  Informed Consent: I have reviewed the patients History and Physical, chart, labs and discussed the procedure including the risks, benefits and alternatives for the proposed anesthesia with the patient or authorized representative who has indicated his/her understanding and acceptance.     Plan Discussed with: CRNA and Surgeon  Anesthesia Plan Comments:         Anesthesia Quick Evaluation

## 2016-11-16 NOTE — Transfer of Care (Signed)
Immediate Anesthesia Transfer of Care Note  Patient: [name]  Procedure(s) Performed: Procedure(s): XI ROBOTIC ASSISTED TOTAL HYSTERECTOMY WITH BILATERAL SALPINGO OOPHORECTOMY (Bilateral) SENTINEL NODE BIOPSY (N/A)  Patient Location: PACU  Anesthesia Type:General  Level of Consciousness:  sedated, patient cooperative and responds to stimulation  Airway & Oxygen Therapy:Patient Spontanous Breathing and Patient connected to face mask oxgen  Post-op Assessment:  Report given to PACU RN and Post -op Vital signs reviewed and stable  Post vital signs:  Reviewed and stable  Last Vitals:  Vitals:   11/16/16 0813  BP: 135/71  Pulse: 71  Resp: 16  Temp: 72.0 C    Complications: No apparent anesthesia complications

## 2016-11-16 NOTE — Anesthesia Procedure Notes (Signed)
Procedure Name: Intubation Date/Time: 11/16/2016 10:00 AM Performed by: Talbot Grumbling Pre-anesthesia Checklist: Patient identified, Emergency Drugs available, Suction available and Patient being monitored Patient Re-evaluated:Patient Re-evaluated prior to inductionOxygen Delivery Method: Circle system utilized Preoxygenation: Pre-oxygenation with 100% oxygen Intubation Type: IV induction Ventilation: Mask ventilation without difficulty Laryngoscope Size: Miller and 2 Grade View: Grade I Tube type: Oral Tube size: 7.5 mm Number of attempts: 1 Airway Equipment and Method: Stylet Placement Confirmation: ETT inserted through vocal cords under direct vision,  positive ETCO2 and breath sounds checked- equal and bilateral Secured at: 19 cm Tube secured with: Tape Dental Injury: Teeth and Oropharynx as per pre-operative assessment

## 2016-11-16 NOTE — Anesthesia Postprocedure Evaluation (Signed)
Anesthesia Post Note  Patient: Pegi Milazzo  Procedure(s) Performed: Procedure(s) (LRB): XI ROBOTIC ASSISTED TOTAL HYSTERECTOMY WITH BILATERAL SALPINGO OOPHORECTOMY (Bilateral) SENTINEL NODE BIOPSY (N/A)  Patient location during evaluation: PACU Anesthesia Type: General Level of consciousness: awake and alert Pain management: pain level controlled Vital Signs Assessment: post-procedure vital signs reviewed and stable Respiratory status: spontaneous breathing, nonlabored ventilation, respiratory function stable and patient connected to nasal cannula oxygen Cardiovascular status: blood pressure returned to baseline and stable Postop Assessment: no signs of nausea or vomiting Anesthetic complications: no       Last Vitals:  Vitals:   11/16/16 1247 11/16/16 1300  BP: 136/81   Pulse: 81   Resp: 16   Temp:  36.6 C    Last Pain:  Vitals:   11/16/16 1245  TempSrc:   PainSc: 8                  Emileo Semel DAVID

## 2016-11-17 ENCOUNTER — Encounter (HOSPITAL_COMMUNITY): Payer: Self-pay | Admitting: Gynecologic Oncology

## 2016-11-17 ENCOUNTER — Telehealth: Payer: Self-pay | Admitting: *Deleted

## 2016-11-17 DIAGNOSIS — F419 Anxiety disorder, unspecified: Secondary | ICD-10-CM | POA: Diagnosis not present

## 2016-11-17 DIAGNOSIS — E119 Type 2 diabetes mellitus without complications: Secondary | ICD-10-CM | POA: Diagnosis not present

## 2016-11-17 DIAGNOSIS — Z79899 Other long term (current) drug therapy: Secondary | ICD-10-CM | POA: Diagnosis not present

## 2016-11-17 DIAGNOSIS — F17210 Nicotine dependence, cigarettes, uncomplicated: Secondary | ICD-10-CM | POA: Diagnosis not present

## 2016-11-17 DIAGNOSIS — C541 Malignant neoplasm of endometrium: Secondary | ICD-10-CM | POA: Diagnosis not present

## 2016-11-17 DIAGNOSIS — Z7984 Long term (current) use of oral hypoglycemic drugs: Secondary | ICD-10-CM | POA: Diagnosis not present

## 2016-11-17 LAB — CBC
HEMATOCRIT: 36.6 % (ref 36.0–46.0)
HEMOGLOBIN: 12.1 g/dL (ref 12.0–15.0)
MCH: 29.7 pg (ref 26.0–34.0)
MCHC: 33.1 g/dL (ref 30.0–36.0)
MCV: 89.7 fL (ref 78.0–100.0)
Platelets: 235 10*3/uL (ref 150–400)
RBC: 4.08 MIL/uL (ref 3.87–5.11)
RDW: 13.3 % (ref 11.5–15.5)
WBC: 12.2 10*3/uL — ABNORMAL HIGH (ref 4.0–10.5)

## 2016-11-17 LAB — BASIC METABOLIC PANEL
Anion gap: 7 (ref 5–15)
BUN: 7 mg/dL (ref 6–20)
CHLORIDE: 105 mmol/L (ref 101–111)
CO2: 27 mmol/L (ref 22–32)
CREATININE: 0.72 mg/dL (ref 0.44–1.00)
Calcium: 8.8 mg/dL — ABNORMAL LOW (ref 8.9–10.3)
GFR calc Af Amer: >60 mL/min (ref >60)
GFR calc non Af Amer: >60 mL/min (ref >60)
Glucose, Bld: 200 mg/dL — ABNORMAL HIGH (ref 65–99)
Potassium: 4.1 mmol/L (ref 3.5–5.1)
Sodium: 139 mmol/L (ref 135–145)

## 2016-11-17 LAB — GLUCOSE, CAPILLARY: Glucose-Capillary: 160 mg/dL — ABNORMAL HIGH (ref 65–99)

## 2016-11-17 MED ORDER — SENNA 8.6 MG PO TABS: 1.0000 | ORAL_TABLET | Freq: Every day | ORAL | 0 refills | Status: DC

## 2016-11-17 MED ORDER — OXYCODONE-ACETAMINOPHEN 5-325 MG PO TABS: 1.0000 | ORAL_TABLET | ORAL | 0 refills | Status: DC | PRN

## 2016-11-17 MED ORDER — IBUPROFEN 600 MG PO TABS: 600.0000 mg | ORAL_TABLET | Freq: Four times a day (QID) | ORAL | 0 refills | Status: DC | PRN

## 2016-11-17 NOTE — Telephone Encounter (Signed)
I have scheduled patient post op appt for April 23rd. I have called and gave Amy on the fifth floor the information. Patient to receive appt at discharge

## 2016-11-17 NOTE — Discharge Summary (Signed)
Physician Discharge Summary  Patient ID: [name] MRN: [id number] DOB/AGE: Feb 19, 1966 51 y.o.  Admit date: 11/16/2016 Discharge date: 11/17/2016  Admission Diagnoses: Endometrial cancer [facility])  Discharge Diagnoses:  Principal Problem:   Endometrial cancer [facility])   Discharged Condition: good  Hospital Course:  1/ patient was admitted on 11/16/16 for robotic assisted total hysterectomy, BSO and SLN biopsy for grade 1 endometrial cancer 2/ surgery was uncomplicated  3/ on postoperative day 1 the patient was meeting discharge criteria: tolerating PO, voiding urine, ambulating, pain well controlled on oral medications. She did endorse some gas pains in her abdomen. 4/ new medications on discharge include percocet, sennakot.  Consults: None  Significant Diagnostic Studies: labs:  CBC    Component Value Date/Time   WBC 12.2 (H) 11/17/2016 0451   RBC 4.08 11/17/2016 0451   HGB 12.1 11/17/2016 0451   HCT 36.6 11/17/2016 0451   HCT 43.6 02/13/2016 1108   PLT 235 11/17/2016 0451   PLT 294 02/13/2016 1108   MCV 89.7 11/17/2016 0451   MCV 89 02/13/2016 1108   MCH 29.7 11/17/2016 0451   MCHC 33.1 11/17/2016 0451   RDW 13.3 11/17/2016 0451   RDW 13.3 02/13/2016 1108   LYMPHSABS 2.4 02/13/2016 1108   EOSABS 0.1 02/13/2016 1108   BASOSABS 0.0 02/13/2016 1108    Treatments: surgery: see above  Discharge Exam: Blood pressure 128/70, pulse 73, temperature 98.2 F (36.8 C), temperature source Oral, resp. rate 17, height 5\' 8"  (1.727 m), weight 159 lb (72.1 kg), SpO2 94 %. General appearance: alert and cooperative GI: soft, non-tender; bowel sounds normal; no masses,  no organomegaly Incision/Wound: clean and intact x 5 with dermabond  Disposition: 01-Home or Self Care  Discharge Instructions    (HEART FAILURE PATIENTS) Call MD:  Anytime you have any of the following symptoms: 1) 3 pound weight gain in 24 hours or 5 pounds in 1 week 2) shortness of breath, with or without a  dry hacking cough 3) swelling in the hands, feet or stomach 4) if you have to sleep on extra pillows at night in order to breathe.    Complete by:  As directed    Call MD for:  difficulty breathing, headache or visual disturbances    Complete by:  As directed    Call MD for:  extreme fatigue    Complete by:  As directed    Call MD for:  hives    Complete by:  As directed    Call MD for:  persistant dizziness or light-headedness    Complete by:  As directed    Call MD for:  persistant nausea and vomiting    Complete by:  As directed    Call MD for:  redness, tenderness, or signs of infection (pain, swelling, redness, odor or green/yellow discharge around incision site)    Complete by:  As directed    Call MD for:  severe uncontrolled pain    Complete by:  As directed    Call MD for:  temperature >100.4    Complete by:  As directed    Diet - low sodium heart healthy    Complete by:  As directed    Diet general    Complete by:  As directed    Driving Restrictions    Complete by:  As directed    No driving for 7 days or until off narcotic pain medication   Increase activity slowly    Complete by:  As directed  Remove dressing in 24 hours    Complete by:  As directed    Sexual Activity Restrictions    Complete by:  As directed    No intercourse for 6 weeks     Allergies as of 11/17/2016   No Known Allergies     Medication List    TAKE these medications   cyclobenzaprine 10 MG tablet Commonly known as:  FLEXERIL Take 10 mg by mouth as needed for muscle spasms.   escitalopram 10 MG tablet Commonly known as:  LEXAPRO Take 1 tablet (10 mg total) by mouth daily.   ibuprofen 600 MG tablet Commonly known as:  IBU Take 1 tablet (600 mg total) by mouth every 6 (six) hours as needed.   metFORMIN 500 MG 24 hr tablet Commonly known as:  GLUCOPHAGE-XR 1/2 po QD What changed:  how much to take  how to take this  when to take this  additional instructions    oxyCODONE-acetaminophen 5-325 MG tablet Commonly known as:  PERCOCET/ROXICET Take 1-2 tablets by mouth every 4 (four) hours as needed (moderate to severe pain (when tolerating fluids)).   senna 8.6 MG Tabs tablet Commonly known as:  SENOKOT Take 1 tablet (8.6 mg total) by mouth at bedtime.   Vitamin D (Ergocalciferol) 50000 units Caps capsule Commonly known as:  DRISDOL Take 1 capsule (50,000 Units total) by mouth once a week.        Signed: Donaciano Eva 11/17/2016, 10:36 AM

## 2016-11-17 NOTE — Progress Notes (Signed)
Patient discharge home.  Discharge instructions reviewed and Rx for Percocet 1-2 tabs q 4 hrs PRN #30 given. Patient and family verbalized understanding.  Discharged home with no complaints.

## 2016-11-17 NOTE — Discharge Instructions (Signed)
11/17/2016  Return to work: 4 weeks  Activity: 1. Be up and out of the bed during the day.  Take a nap if needed.  You may walk up steps but be careful and use the hand rail.  Stair climbing will tire you more than you think, you may need to stop part way and rest.   2. No lifting or straining for 6 weeks.  3. No driving for 1 weeks.  Do Not drive if you are taking narcotic pain medicine.  4. Shower daily.  Use soap and water on your incision and pat dry; don't rub.   5. No sexual activity and nothing in the vagina for 8 weeks.  Medications:  - Take ibuprofen and tylenol first line for pain control. Take these regularly (every 6 hours) to decrease the build up of pain.  - If necessary, for severe pain not relieved by ibuprofen, take percocet.  - While taking percocet you should take sennakot every night to reduce the likelihood of constipation. If this causes diarrhea, stop its use.  Diet: 1. Low sodium Heart Healthy Diet is recommended.  2. It is safe to use a laxative if you have difficulty moving your bowels.   Wound Care: 1. Keep clean and dry.  Shower daily.  Reasons to call the Doctor:   Fever - Oral temperature greater than 100.4 degrees Fahrenheit  Foul-smelling vaginal discharge  Difficulty urinating  Nausea and vomiting  Increased pain at the site of the incision that is unrelieved with pain medicine.  Difficulty breathing with or without chest pain  New calf pain especially if only on one side  Sudden, continuing increased vaginal bleeding with or without clots.   Follow-up: 1. See [name] in 3 weeks.  Contacts: For questions or concerns you should contact:  Dr. [name] at [phone] After hours and on week-ends call [phone] and ask to speak to the physician on call for Gynecologic Oncology

## 2016-11-22 ENCOUNTER — Telehealth: Payer: Self-pay | Admitting: Gynecologic Oncology

## 2016-11-22 DIAGNOSIS — C541 Malignant neoplasm of endometrium: Secondary | ICD-10-CM

## 2016-11-22 DIAGNOSIS — C7961 Secondary malignant neoplasm of right ovary: Secondary | ICD-10-CM | POA: Insufficient documentation

## 2016-11-22 NOTE — Telephone Encounter (Signed)
Informed patient of diagnosis of stage IIIA endometrial cancer (right ovarian involvement with deep myometrial uterine invovlement).  Recommend adjuvant chemotherapy with 6 cycles carb/tax. Recommend vaginal brachytherapy that can be intercollated after 6 weeks postop.  Will make referrals to Dr Alvy Bimler and Sondra Come.  Donaciano Eva, MD

## 2016-11-23 ENCOUNTER — Telehealth: Payer: Self-pay | Admitting: *Deleted

## 2016-11-23 ENCOUNTER — Encounter: Payer: Self-pay | Admitting: Radiation Oncology

## 2016-11-23 NOTE — Telephone Encounter (Signed)
Returned patient call. I have given all of her appts. (CT scan, Dr. Alvy Bimler and Dr.Rossi)  Patient aware and wrote down appts

## 2016-11-23 NOTE — Telephone Encounter (Signed)
I have called and left a message for the patient to call the office. Patient needs to be given upcoming appts with Dr. Sondra Come and Dr. Alvy Bimler

## 2016-11-23 NOTE — Telephone Encounter (Signed)
OK, got it

## 2016-11-24 ENCOUNTER — Encounter: Payer: Self-pay | Admitting: Hematology and Oncology

## 2016-11-29 ENCOUNTER — Encounter (HOSPITAL_COMMUNITY): Payer: Self-pay | Admitting: Radiology

## 2016-11-29 ENCOUNTER — Ambulatory Visit (HOSPITAL_COMMUNITY)
Admission: RE | Admit: 2016-11-29 | Discharge: 2016-11-29 | Disposition: A | Discharge status: Home / Self Care | Payer: BLUE CROSS/BLUE SHIELD | Source: Ambulatory Visit | Attending: Gynecologic Oncology | Admitting: Gynecologic Oncology

## 2016-11-29 DIAGNOSIS — I700 Atherosclerosis of aorta: Secondary | ICD-10-CM | POA: Diagnosis not present

## 2016-11-29 DIAGNOSIS — I2510 Atherosclerotic heart disease of native coronary artery without angina pectoris: Secondary | ICD-10-CM | POA: Diagnosis not present

## 2016-11-29 DIAGNOSIS — K5730 Diverticulosis of large intestine without perforation or abscess without bleeding: Secondary | ICD-10-CM | POA: Diagnosis not present

## 2016-11-29 DIAGNOSIS — C7961 Secondary malignant neoplasm of right ovary: Secondary | ICD-10-CM

## 2016-11-29 DIAGNOSIS — J449 Chronic obstructive pulmonary disease, unspecified: Secondary | ICD-10-CM | POA: Insufficient documentation

## 2016-11-29 DIAGNOSIS — Z90710 Acquired absence of both cervix and uterus: Secondary | ICD-10-CM | POA: Insufficient documentation

## 2016-11-29 DIAGNOSIS — K449 Diaphragmatic hernia without obstruction or gangrene: Secondary | ICD-10-CM | POA: Diagnosis not present

## 2016-11-29 DIAGNOSIS — C541 Malignant neoplasm of endometrium: Secondary | ICD-10-CM

## 2016-11-29 DIAGNOSIS — Z9889 Other specified postprocedural states: Secondary | ICD-10-CM | POA: Insufficient documentation

## 2016-11-29 MED ORDER — IOPAMIDOL (ISOVUE-300) INJECTION 61%
INTRAVENOUS | Status: AC
  Administered 2016-11-29: 100 mL
  Filled 2016-11-29: qty 100

## 2016-12-01 ENCOUNTER — Encounter: Payer: Self-pay | Admitting: Hematology and Oncology

## 2016-12-01 ENCOUNTER — Telehealth: Payer: Self-pay | Admitting: Hematology and Oncology

## 2016-12-01 ENCOUNTER — Ambulatory Visit (HOSPITAL_BASED_OUTPATIENT_CLINIC_OR_DEPARTMENT_OTHER): Payer: BLUE CROSS/BLUE SHIELD | Admitting: Hematology and Oncology

## 2016-12-01 VITALS — BP 151/76 | HR 78 | Temp 98.3°F | Resp 18 | Ht 68.0 in | Wt 163.4 lb

## 2016-12-01 DIAGNOSIS — Z720 Tobacco use: Secondary | ICD-10-CM | POA: Diagnosis not present

## 2016-12-01 DIAGNOSIS — C541 Malignant neoplasm of endometrium: Secondary | ICD-10-CM

## 2016-12-01 DIAGNOSIS — E119 Type 2 diabetes mellitus without complications: Secondary | ICD-10-CM

## 2016-12-01 DIAGNOSIS — E1100 Type 2 diabetes mellitus with hyperosmolarity without nonketotic hyperglycemic-hyperosmolar coma (NKHHC): Secondary | ICD-10-CM

## 2016-12-01 MED ORDER — NICOTINE 21 MG/24HR TD PT24: 21.0000 mg | MEDICATED_PATCH | Freq: Every day | TRANSDERMAL | 0 refills | Status: DC

## 2016-12-01 NOTE — Telephone Encounter (Signed)
Appointments schedule dper 12/01/16 los. Patient was given a copy of the AVS report and appointment schedule per 12/01/16 los. °

## 2016-12-01 NOTE — Progress Notes (Signed)
START ON PATHWAY REGIMEN - Uterine     A cycle is every 21 days:     Paclitaxel      Carboplatin   **Always confirm dose/schedule in your pharmacy ordering system**    Patient Characteristics: Endometrioid Histology, First Line - Newly Diagnosed, Medically Operable, Stage IIIA - Grade 1 - 2 - 3 AJCC T Category: T3 AJCC N Category: N0 AJCC M Category: M0 AJCC 8 Stage Grouping: III Line of therapy: First Line - Newly Diagnosed Would you be surprised if this patient died  in the next year? I would be surprised if this patient died in the next year Patient Status: Medically Operable  Intent of Therapy: Curative Intent, Discussed with Patient

## 2016-12-02 NOTE — Assessment & Plan Note (Signed)
The patient has high blood sugar. She has received mixed picture from 2 different doctors and questioned whether she indeed has diagnosis of type 2 diabetes Based on available information, I confirmed with the patient that she indeed have type 2 diabetes I recommend she continues metformin therapy Due to risk of heart disease, I also recommend she start 81 mg aspirin daily

## 2016-12-02 NOTE — Assessment & Plan Note (Signed)
We discussed the role of adjuvant chemotherapy. The patient may benefit from adjuvant radiation therapy as well, consult pending I review CT imaging which show no evidence of residual disease I recommend port placement along with chemo education class before we proceed with treatment I plan to see her back in 2 weeks for final chemotherapy consent The plan would be to proceed with combination of carboplatin and Taxol, every 3 weeks for 6 cycles total She agree with the plan of care

## 2016-12-02 NOTE — Assessment & Plan Note (Signed)
I spent some time counseling the patient the importance of tobacco cessation. We discussed common strategies including nicotine patches, Tobacco Quit-line, and other nicotine replacement products to assist in hereffort to quit  she appears motivated to quit. I gave her prescription of nicotine patch to try

## 2016-12-02 NOTE — Progress Notes (Signed)
[place] [facility] NOTE  Patient Care Team: [name], [place] as PCP - General (Nurse Practitioner)  CHIEF COMPLAINTS/PURPOSE OF CONSULTATION:  Endometrial cancer  HISTORY OF PRESENTING ILLNESS:  [name] 51 y.o. female is here because of recent diagnosis of endometrial cancer The patient is here today, accompanied by her sister, Helene Kelp I review her records extensively, collaborated the history with the patient and summarized as follows:   Endometrial cancer ([place])   09/22/2016 Initial Diagnosis    She presented to her physician with postmenopausal bleeding      10/13/2016 Imaging    The patient reported post-menopausal bleeding to her physician who performed a TVUS on 10/13/16. This showed a uterus measuring 7.6x4.6x 6.26cm. The endometrial lining measuringed 2.24cm. The ovaries were normal. A 4cm intracavitary mass was appreciated on [place].       10/13/2016 Pathology Results    The patient then underwent endometrial pipelle biopsy on 10/13/16. It revealed an endometrioid adenocarcinoma, (FIGO grade 1).       11/16/2016 Pathology Results    1. Lymph node, sentinel, biopsy, right obturator - TWO BENIGN LYMPH NODES (0/2). 2. Lymph node, sentinel, biopsy, right external iliac - ONE BENIGN LYMPH NODE (0/1). 3. Lymph node, sentinel, biopsy, left obturator - ONE BENIGN LYMPH NODE (0/1). 4. Uterus +/- tubes/ovaries, neoplastic - ENDOMETRIOID ADENOCARCINOMA, 5.2 CM. - CARCINOMA FOCALLY INVOLVES DEEP MYOMETRIUM. - RIGHT FALLOPIAN TUBE AND RIGHT OVARY INVOLVED BY ENDOMETRIOID CARCINOMA. - CERVIX, LEFT FALLOPIAN, AND LEFT OVARY FREE OF TUMOR. - SEE ONCOLOGY TABLE AND COMMENT.      11/16/2016 Surgery    Operation: Robotic-assisted laparoscopic total hysterectomy with bilateral salpingoophorectomy with bilateral SLN biopsy  Surgeon: Donaciano Eva  Operative Findings:  : 7cm normal appearing uterus, normal appearing tubes and ovaries, no suspicious  nodes.      11/29/2016 Imaging    1. Interval hysterectomy.  No evidence of metastatic disease. 2. Postsurgical changes in the pelvis with minimal free fluid and soft tissue edema. 3. Stable colon diverticular changes without acute inflammation. 4. Suspected small incidental lesions within the liver and spleen. 5. Stable sclerotic lesion in the left ischium, likely benign      She tolerated surgery well.  Most of her wound has healed.  She has mild abdominal bloating but does not bother her.  Her appetite is stable and she had regular bowel movement. She denies excessive pain from recent surgery  MEDICAL HISTORY:  Past Medical History:  Diagnosis Date  . Anxiety   . Arthritis   . Basal cell carcinoma    face  . Diabetes mellitus without complication ([place])    type II   . History of bronchitis     SURGICAL HISTORY: Past Surgical History:  Procedure Laterality Date  . BASAL CELL CARCINOMA EXCISION     face  . GANGLION CYST EXCISION Left 08/06/2014   Procedure: EXCISION LEFT  WRIST GANGLION;  Surgeon: Leanora Cover, MD;  Location: [facility];  Service: Orthopedics;  Laterality: Left;  . KNEE SURGERY     right x3  . ROBOTIC ASSISTED TOTAL HYSTERECTOMY WITH BILATERAL SALPINGO OOPHERECTOMY Bilateral 11/16/2016   Procedure: XI ROBOTIC ASSISTED TOTAL HYSTERECTOMY WITH BILATERAL SALPINGO OOPHORECTOMY;  Surgeon: [name], MD;  Location: [facility];  Service: Gynecology;  Laterality: Bilateral;  . SENTINEL NODE BIOPSY N/A 11/16/2016   Procedure: SENTINEL NODE BIOPSY;  Surgeon: [name], MD;  Location: [facility];  Service: Gynecology;  Laterality: N/A;  . TOOTH EXTRACTION  SOCIAL HISTORY: Social History   Social History  . Marital status: Married    Spouse name: N/A  . Number of children: 0  . Years of education: N/A   Occupational History  . Designer, multimedia    Social History Main Topics  . Smoking status: Current Every Day Smoker    Packs/day: 1.00    Years:  34.00    Types: Cigarettes  . Smokeless tobacco: Never Used  . Alcohol use No  . Drug use: No  . Sexual activity: Yes   Other Topics Concern  . Not on file   Social History Narrative  . No narrative on file    FAMILY HISTORY: Family History  Problem Relation Age of Onset  . Hypertension Mother   . Heart disease Father   . Cancer Father     lung ca  . Arthritis Sister     rheumatoid  . Cancer Brother     prostate    ALLERGIES:  has No Known Allergies.  MEDICATIONS:  Current Outpatient Prescriptions  Medication Sig Dispense Refill  . escitalopram (LEXAPRO) 10 MG tablet Take 1 tablet (10 mg total) by mouth daily. 90 tablet 3  . ibuprofen (IBU) 600 MG tablet Take 1 tablet (600 mg total) by mouth every 6 (six) hours as needed. 30 tablet 0  . metFORMIN (GLUCOPHAGE-XR) 500 MG 24 hr tablet 1/2 po QD (Patient taking differently: Take 500 mg by mouth every evening. Around 5:00 pm) 90 tablet 1  . oxyCODONE-acetaminophen (PERCOCET/ROXICET) 5-325 MG tablet Take 1-2 tablets by mouth every 4 (four) hours as needed (moderate to severe pain (when tolerating fluids)). 30 tablet 0  . senna (SENOKOT) 8.6 MG TABS tablet Take 1 tablet (8.6 mg total) by mouth at bedtime. 120 each 0  . Vitamin D, Ergocalciferol, (DRISDOL) 50000 units CAPS capsule Take 1 capsule (50,000 Units total) by mouth once a week. 12 capsule 1  . cyclobenzaprine (FLEXERIL) 10 MG tablet Take 10 mg by mouth as needed for muscle spasms.    . nicotine (NICODERM CQ - DOSED IN MG/24 HOURS) 21 mg/24hr patch Place 1 patch (21 mg total) onto the skin daily. 28 patch 0   No current facility-administered medications for this visit.     REVIEW OF SYSTEMS:   Constitutional: Denies fevers, chills or abnormal night sweats Eyes: Denies blurriness of vision, double vision or watery eyes Ears, nose, mouth, throat, and face: Denies mucositis or sore throat Respiratory: Denies cough, dyspnea or wheezes Cardiovascular: Denies palpitation,  chest discomfort or lower extremity swelling Gastrointestinal:  Denies nausea, heartburn or change in bowel habits Skin: Denies abnormal skin rashes Lymphatics: Denies new lymphadenopathy or easy bruising Neurological:Denies numbness, tingling or new weaknesses Behavioral/Psych: Mood is stable, no new changes  All other systems were reviewed with the patient and are negative.  PHYSICAL EXAMINATION: ECOG PERFORMANCE STATUS: 1 - Symptomatic but completely ambulatory  Vitals:   12/01/16 1310  BP: (!) 151/76  Pulse: 78  Resp: 18  Temp: 98.3 F (36.8 C)   Filed Weights   12/01/16 1310  Weight: 163 lb 6.4 oz (74.1 kg)    GENERAL:alert, no distress and comfortable SKIN: skin color, texture, turgor are normal, no rashes or significant lesions EYES: normal, conjunctiva are pink and non-injected, sclera clear OROPHARYNX:no exudate, no erythema and lips, buccal mucosa, and tongue normal  NECK: supple, thyroid normal size, non-tender, without nodularity LYMPH:  no palpable lymphadenopathy in the cervical, axillary or inguinal LUNGS: clear to auscultation and percussion with  normal breathing effort HEART: regular rate & rhythm and no murmurs and no lower extremity edema ABDOMEN:abdomen soft, non-tender and normal bowel sounds.  Well-healed surgical scar Musculoskeletal:no cyanosis of digits and no clubbing  PSYCH: alert & oriented x 3 with fluent speech NEURO: no focal motor/sensory deficits  LABORATORY DATA:  I have reviewed the data as listed Lab Results  Component Value Date   WBC 12.2 (H) 11/17/2016   HGB 12.1 11/17/2016   HCT 36.6 11/17/2016   MCV 89.7 11/17/2016   PLT 235 11/17/2016    Recent Labs  12/15/15 0828 02/13/16 1108 11/05/16 1017 11/16/16 1700 11/17/16 0451  NA 142 139 139  --  139  K 4.0 4.2 3.9  --  4.1  CL 101 98 108  --  105  CO2 22 24 26   --  27  GLUCOSE 175* 172* 123*  --  200*  BUN 11 11 10   --  7  CREATININE 0.76 0.68 0.66 0.71 0.72  CALCIUM  9.2 9.5 9.2  --  8.8*  GFRNONAA 92 102 >60 >60 >60  GFRAA 106 118 >60 >60 >60  PROT 6.6 7.0 7.5  --   --   ALBUMIN 4.1 4.2 4.1  --   --   AST 10 18 16   --   --   ALT 11 19 16   --   --   ALKPHOS 103 110 96  --   --   BILITOT 0.6 1.0 1.0  --   --     RADIOGRAPHIC STUDIES: I have personally reviewed the radiological images as listed and agreed with the findings in the report. Ct Chest W Contrast  Result Date: 11/29/2016 CLINICAL DATA:  Staging of recently diagnosed endometrial cancer. No therapy yet. EXAM: CT CHEST, ABDOMEN, AND PELVIS WITH CONTRAST TECHNIQUE: Multidetector CT imaging of the chest, abdomen and pelvis was performed following the standard protocol during bolus administration of intravenous contrast. CONTRAST:  1 ISOVUE-300 IOPAMIDOL (ISOVUE-300) INJECTION 61% COMPARISON:  Noncontrast abdominopelvic CT 06/02/2016. FINDINGS: CT CHEST FINDINGS Cardiovascular: Mild atherosclerosis of the aorta, great vessels and coronary arteries. No acute vascular findings are demonstrated. The heart size is normal. There is no pericardial effusion. Mediastinum/Nodes: There are small mediastinal lymph nodes, including a 7 mm high right paratracheal node on image 10. No pathologically enlarged mediastinal, hilar or axillary lymph nodes are seen. There is a small hiatal hernia. The thyroid gland and trachea appear unremarkable. Lungs/Pleura: There is no pleural effusion. There are mild emphysematous changes with an upper lobe predominance. No suspicious pulmonary nodules. Musculoskeletal/Chest wall: No chest wall mass or suspicious osseous findings. CT ABDOMEN AND PELVIS FINDINGS Hepatobiliary: There is a small low-density lesion anteriorly in the dome of the right hepatic lobe on image number 50. Although not well seen on the previous noncontrast study, this is probably not new. No worrisome hepatic findings. No evidence of gallstones, gallbladder wall thickening or biliary dilatation. Pancreas: Unremarkable.  No pancreatic ductal dilatation or surrounding inflammatory changes. Spleen: Tiny low-density lesion inferiorly in the spleen (image 67 of series 2) of doubtful significance. The spleen is normal in size without suspicious finding. Adrenals/Urinary Tract: Both adrenal glands appear normal. The kidneys appear normal without evidence of urinary tract calculus, suspicious lesion or hydronephrosis. No bladder abnormalities are seen. Stomach/Bowel: No evidence of bowel wall thickening, distention or surrounding inflammatory change. There are sigmoid colon diverticular changes without surrounding inflammation. The appendix appears normal. Vascular/Lymphatic: There are no enlarged abdominal or pelvic lymph nodes. Mild aortic  and branch vessel atherosclerosis. No acute vascular findings. Reproductive: Interval hysterectomy. There is a small amount of pelvic free fluid and soft tissue edema. No adnexal mass or focal extraluminal fluid collection. Other: The anterior abdominal wall appears normal. There is no generalized ascites or peritoneal nodularity. Musculoskeletal: No acute or significant osseous findings. There is a stable sclerotic lesion within the left ischium (axial image 124). No lytic lesions are seen. IMPRESSION: 1. Interval hysterectomy.  No evidence of metastatic disease. 2. Postsurgical changes in the pelvis with minimal free fluid and soft tissue edema. 3. Stable colon diverticular changes without acute inflammation. 4. Suspected small incidental lesions within the liver and spleen. 5. Stable sclerotic lesion in the left ischium, likely benign. Electronically Signed   By: Richardean Sale M.D.   On: 11/29/2016 16:34   Ct Abdomen Pelvis W Contrast  Result Date: 11/29/2016 CLINICAL DATA:  Staging of recently diagnosed endometrial cancer. No therapy yet. EXAM: CT CHEST, ABDOMEN, AND PELVIS WITH CONTRAST TECHNIQUE: Multidetector CT imaging of the chest, abdomen and pelvis was performed following the standard  protocol during bolus administration of intravenous contrast. CONTRAST:  1 ISOVUE-300 IOPAMIDOL (ISOVUE-300) INJECTION 61% COMPARISON:  Noncontrast abdominopelvic CT 06/02/2016. FINDINGS: CT CHEST FINDINGS Cardiovascular: Mild atherosclerosis of the aorta, great vessels and coronary arteries. No acute vascular findings are demonstrated. The heart size is normal. There is no pericardial effusion. Mediastinum/Nodes: There are small mediastinal lymph nodes, including a 7 mm high right paratracheal node on image 10. No pathologically enlarged mediastinal, hilar or axillary lymph nodes are seen. There is a small hiatal hernia. The thyroid gland and trachea appear unremarkable. Lungs/Pleura: There is no pleural effusion. There are mild emphysematous changes with an upper lobe predominance. No suspicious pulmonary nodules. Musculoskeletal/Chest wall: No chest wall mass or suspicious osseous findings. CT ABDOMEN AND PELVIS FINDINGS Hepatobiliary: There is a small low-density lesion anteriorly in the dome of the right hepatic lobe on image number 50. Although not well seen on the previous noncontrast study, this is probably not new. No worrisome hepatic findings. No evidence of gallstones, gallbladder wall thickening or biliary dilatation. Pancreas: Unremarkable. No pancreatic ductal dilatation or surrounding inflammatory changes. Spleen: Tiny low-density lesion inferiorly in the spleen (image 67 of series 2) of doubtful significance. The spleen is normal in size without suspicious finding. Adrenals/Urinary Tract: Both adrenal glands appear normal. The kidneys appear normal without evidence of urinary tract calculus, suspicious lesion or hydronephrosis. No bladder abnormalities are seen. Stomach/Bowel: No evidence of bowel wall thickening, distention or surrounding inflammatory change. There are sigmoid colon diverticular changes without surrounding inflammation. The appendix appears normal. Vascular/Lymphatic: There are no  enlarged abdominal or pelvic lymph nodes. Mild aortic and branch vessel atherosclerosis. No acute vascular findings. Reproductive: Interval hysterectomy. There is a small amount of pelvic free fluid and soft tissue edema. No adnexal mass or focal extraluminal fluid collection. Other: The anterior abdominal wall appears normal. There is no generalized ascites or peritoneal nodularity. Musculoskeletal: No acute or significant osseous findings. There is a stable sclerotic lesion within the left ischium (axial image 124). No lytic lesions are seen. IMPRESSION: 1. Interval hysterectomy.  No evidence of metastatic disease. 2. Postsurgical changes in the pelvis with minimal free fluid and soft tissue edema. 3. Stable colon diverticular changes without acute inflammation. 4. Suspected small incidental lesions within the liver and spleen. 5. Stable sclerotic lesion in the left ischium, likely benign. Electronically Signed   By: Richardean Sale M.D.   On: 11/29/2016 16:34  ASSESSMENT & PLAN:  Endometrial cancer ([place]) We discussed the role of adjuvant chemotherapy. The patient may benefit from adjuvant radiation therapy as well, consult pending I review CT imaging which show no evidence of residual disease I recommend port placement along with chemo education class before we proceed with treatment I plan to see her back in 2 weeks for final chemotherapy consent The plan would be to proceed with combination of carboplatin and Taxol, every 3 weeks for 6 cycles total She agree with the plan of care  Tobacco user I spent some time counseling the patient the importance of tobacco cessation. We discussed common strategies including nicotine patches, Tobacco Quit-line, and other nicotine replacement products to assist in hereffort to quit  she appears motivated to quit. I gave her prescription of nicotine patch to try  Type 2 diabetes mellitus ([place]) The patient has high blood sugar. She has received mixed  picture from 2 different doctors and questioned whether she indeed has diagnosis of type 2 diabetes Based on available information, I confirmed with the patient that she indeed have type 2 diabetes I recommend she continues metformin therapy Due to risk of heart disease, I also recommend she start 81 mg aspirin daily    All questions were answered. The patient knows to call the clinic with any problems, questions or concerns. I spent 55 minutes counseling the patient face to face. The total time spent in the appointment was 60 minutes and more than 50% was on counseling.     Heath Lark, MD 12/02/2016 7:38 AM

## 2016-12-03 ENCOUNTER — Ambulatory Visit: Payer: BLUE CROSS/BLUE SHIELD | Admitting: Hematology and Oncology

## 2016-12-03 NOTE — Progress Notes (Signed)
GYN Location of Tumor / Histology: stage IIIA endometrial cancer (right ovarian involvement with deep myometrial uterine invovlement)  [name] presented with symptoms of:   Biopsies revealed:   11/16/16 Diagnosis 1. Lymph node, sentinel, biopsy, right obturator - TWO BENIGN LYMPH NODES (0/2). 2. Lymph node, sentinel, biopsy, right external iliac - ONE BENIGN LYMPH NODE (0/1). 3. Lymph node, sentinel, biopsy, left obturator - ONE BENIGN LYMPH NODE (0/1). 4. Uterus +/- tubes/ovaries, neoplastic - ENDOMETRIOID ADENOCARCINOMA, 5.2 CM. - CARCINOMA FOCALLY INVOLVES DEEP MYOMETRIUM. - RIGHT FALLOPIAN TUBE AND RIGHT OVARY INVOLVED BY ENDOMETRIOID CARCINOMA. - CERVIX, LEFT FALLOPIAN, AND LEFT OVARY FREE OF TUMOR. - SEE ONCOLOGY TABLE AND COMMENT.  Past/Anticipated interventions by Gyn/Onc surgery, if any: 11/16/16 - Procedure: XI ROBOTIC ASSISTED TOTAL HYSTERECTOMY WITH BILATERAL SALPINGO OOPHORECTOMY;  Surgeon: [name], MD  Past/Anticipated interventions by medical oncology, if any: carboplatin and Taxol, every 3 weeks for 6 cycles total - will start 12/21/16.  Weight changes, if any: no  Bowel/Bladder complaints, if any: No.  Nausea/Vomiting, if any: no  Pain issues, if any:  no  SAFETY ISSUES:  Prior radiation? no  Pacemaker/ICD? no  Possible current pregnancy? no  Is the patient on methotrexate? no  Current Complaints / other details:  Dr. Denman George is recomending "adjuvant chemotherapy with 6 cycles carb/tax. Recommend vaginal brachytherapy that can be intercollated after 6 weeks postop."  Patient is here with her husband.  BP 131/88 (BP Location: Left Arm, Patient Position: Sitting)   Pulse 96   Temp 98.4 F (36.9 C) (Oral)   Ht 5\' 8"  (1.727 m)   Wt 162 lb 12.8 oz (73.8 kg)   LMP 07/25/2014 Comment: perimenopausal  SpO2 99%   BMI 24.75 kg/m    Wt Readings from Last 3 Encounters:  12/09/16 162 lb 12.8 oz (73.8 kg)  12/06/16 162 lb 8 oz (73.7 kg)  12/01/16  163 lb 6.4 oz (74.1 kg)

## 2016-12-06 ENCOUNTER — Telehealth: Payer: Self-pay | Admitting: Hematology and Oncology

## 2016-12-06 ENCOUNTER — Encounter: Payer: Self-pay | Admitting: Gynecologic Oncology

## 2016-12-06 ENCOUNTER — Ambulatory Visit: Payer: BLUE CROSS/BLUE SHIELD | Attending: Gynecologic Oncology | Admitting: Gynecologic Oncology

## 2016-12-06 VITALS — BP 128/74 | HR 79 | Temp 98.5°F | Resp 18 | Wt 162.5 lb

## 2016-12-06 DIAGNOSIS — F419 Anxiety disorder, unspecified: Secondary | ICD-10-CM | POA: Insufficient documentation

## 2016-12-06 DIAGNOSIS — F17210 Nicotine dependence, cigarettes, uncomplicated: Secondary | ICD-10-CM | POA: Diagnosis not present

## 2016-12-06 DIAGNOSIS — M1990 Unspecified osteoarthritis, unspecified site: Secondary | ICD-10-CM | POA: Diagnosis not present

## 2016-12-06 DIAGNOSIS — E119 Type 2 diabetes mellitus without complications: Secondary | ICD-10-CM | POA: Diagnosis not present

## 2016-12-06 DIAGNOSIS — N950 Postmenopausal bleeding: Secondary | ICD-10-CM | POA: Insufficient documentation

## 2016-12-06 DIAGNOSIS — C7982 Secondary malignant neoplasm of genital organs: Secondary | ICD-10-CM | POA: Diagnosis not present

## 2016-12-06 DIAGNOSIS — C7961 Secondary malignant neoplasm of right ovary: Secondary | ICD-10-CM | POA: Diagnosis not present

## 2016-12-06 DIAGNOSIS — C541 Malignant neoplasm of endometrium: Secondary | ICD-10-CM | POA: Diagnosis not present

## 2016-12-06 DIAGNOSIS — Z7189 Other specified counseling: Secondary | ICD-10-CM

## 2016-12-06 DIAGNOSIS — Z85828 Personal history of other malignant neoplasm of skin: Secondary | ICD-10-CM | POA: Insufficient documentation

## 2016-12-06 DIAGNOSIS — Z7984 Long term (current) use of oral hypoglycemic drugs: Secondary | ICD-10-CM | POA: Insufficient documentation

## 2016-12-06 NOTE — Telephone Encounter (Signed)
Faxed on 12/03/16 completed FMLA/Progress report to [employer] fax [phone] and scanned copy to Epic

## 2016-12-06 NOTE — Patient Instructions (Signed)
Please follow-up as scheduled with Dr's Alvy Bimler and Kinard.  You will return to see me as scheduled by them when your treatment has been completed.  Please avoid sex or placing anything in the vagina for another 4 weeks.

## 2016-12-06 NOTE — Progress Notes (Signed)
Follow-up Note: Gyn-Onc  Consult was initially requested by Dr. Radene Knee for the evaluation of Tammy [name]  CC:  Chief Complaint  Patient presents with  . Endometrial Cancer    Assessment/Plan:  Tammy [name]  is a 51 y.o.  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  I reviewed the pathology results with the patient. We discussed the finding of metastatic endometrial cancer in the right tube and ovary. I discussed that this signifies distant spread and a high likelihood of relapse if adjuvant therapy is not prescribed.  I am recommending 6 cycles of adjuvant carboplatin and paclitaxel with vaginal brachytherapy (for high/intermediate uterine factors) to decrease the risk for distant and local relapse.  I will see Tammy [name] back after she has completed therapy.   HPI: Tammy [name] is a 51 year old [name] who is seen in consultation at the request of Dr Radene Knee for grade 1 endometrial cancer.  The patient reported post-menopausal bleeding to her physician who performed a TVUS on 10/13/16. This showed a uterus measuring 7.6x4.6x 6.26cm.  The endometrial lining measuringed 2.24cm. The ovaries were normal. A 4cm intracavitary mass was appreciated on [place].  The patient then underwent endometrial pipelle biopsy on 10/13/16. It revealed an endometrioid adenocarcinoma, (FIGO grade 1).   Interval Hx:  On 11/16/16 she underwent robotic assisted total hysterectomy, BSO, sentinel lymph node biopsy. Final pathology confirmed a stage IIIA grade 1 endometrial cancer with a 5.2cm tumor with 1.2 of 2cm myometrial invasion, no LVSI, and metastases in the right fallopian tube and ovary. The cervix and lymph nodes were benign.  She did well postoperatively with no complaints.  A post-op CT of the chest, abdomen and pelvis showed no gross macroscopic metastatic disease.  Current Meds:  Outpatient Encounter Prescriptions as of 12/06/2016  Medication Sig   . cyclobenzaprine (FLEXERIL) 10 MG tablet Take 10 mg by mouth as needed for muscle spasms.  Marland Kitchen escitalopram (LEXAPRO) 10 MG tablet Take 1 tablet (10 mg total) by mouth daily.  Marland Kitchen ibuprofen (IBU) 600 MG tablet Take 1 tablet (600 mg total) by mouth every 6 (six) hours as needed.  . metFORMIN (GLUCOPHAGE-XR) 500 MG 24 hr tablet 1/2 po QD (Patient taking differently: Take 500 mg by mouth daily. )  . nicotine (NICODERM CQ - DOSED IN MG/24 HOURS) 21 mg/24hr patch Place 1 patch (21 mg total) onto the skin daily.  Marland Kitchen oxyCODONE-acetaminophen (PERCOCET/ROXICET) 5-325 MG tablet Take 1-2 tablets by mouth every 4 (four) hours as needed (moderate to severe pain (when tolerating fluids)).  Marland Kitchen senna (SENOKOT) 8.6 MG TABS tablet Take 1 tablet (8.6 mg total) by mouth at bedtime.  . Vitamin D, Ergocalciferol, (DRISDOL) 50000 units CAPS capsule Take 1 capsule (50,000 Units total) by mouth once a week.   No facility-administered encounter medications on file as of 12/06/2016.     Allergy: No Known Allergies  Social Hx:   Social History   Social History  . Marital status: Married    Spouse name: N/A  . Number of children: 0  . Years of education: N/A   Occupational History  . Designer, multimedia    Social History Main Topics  . Smoking status: Current Every Day Smoker    Packs/day: 1.00    Years: 34.00    Types: Cigarettes  . Smokeless tobacco: Never Used  . Alcohol use No  . Drug use: No  . Sexual activity: Yes   Other Topics Concern  .  Not on file   Social History Narrative  . No narrative on file    Past Surgical Hx:  Past Surgical History:  Procedure Laterality Date  . BASAL CELL CARCINOMA EXCISION     face  . GANGLION CYST EXCISION Left 08/06/2014   Procedure: EXCISION LEFT  WRIST GANGLION;  Surgeon: Leanora Cover, MD;  Location: [facility];  Service: Orthopedics;  Laterality: Left;  . KNEE SURGERY     right x3  . ROBOTIC ASSISTED TOTAL HYSTERECTOMY WITH BILATERAL SALPINGO  OOPHERECTOMY Bilateral 11/16/2016   Procedure: XI ROBOTIC ASSISTED TOTAL HYSTERECTOMY WITH BILATERAL SALPINGO OOPHORECTOMY;  Surgeon: [name], MD;  Location: [facility];  Service: Gynecology;  Laterality: Bilateral;  . SENTINEL NODE BIOPSY N/A 11/16/2016   Procedure: SENTINEL NODE BIOPSY;  Surgeon: [name], MD;  Location: [facility];  Service: Gynecology;  Laterality: N/A;  . TOOTH EXTRACTION      Past Medical Hx:  Past Medical History:  Diagnosis Date  . Anxiety   . Arthritis   . Basal cell carcinoma    face  . Diabetes mellitus without complication ([place])    type II   . History of bronchitis     Past Gynecological History:  [name] Patient's last menstrual period was 07/25/2014.  Family Hx:  Family History  Problem Relation Age of Onset  . Hypertension Mother   . Heart disease Father   . Cancer Father     lung ca  . Arthritis Sister     rheumatoid  . Cancer Brother     prostate    Review of Systems:  Constitutional  Feels well,    ENT Normal appearing ears and nares bilaterally Skin/Breast  No rash, sores, jaundice, itching, dryness Cardiovascular  No chest pain, shortness of breath, or edema  Pulmonary  No cough or wheeze.  Gastro Intestinal  No nausea, vomitting, or diarrhoea. No bright red blood per rectum, no abdominal pain, change in bowel movement, or constipation.  Genito Urinary  No frequency, urgency, dysuria, no bleeding Musculo Skeletal  No myalgia, arthralgia, joint swelling or pain  Neurologic  No weakness, numbness, change in gait,  Psychology  No depression, anxiety, insomnia.   Vitals:  Blood pressure 128/74, pulse 79, temperature 98.5 F (36.9 C), resp. rate 18, weight 162 lb 8 oz (73.7 kg), last menstrual period 07/25/2014.  Physical Exam: WD in NAD Neck  Supple NROM, without any enlargements.  Lymph Node Survey No cervical supraclavicular or inguinal adenopathy Cardiovascular  Pulse normal rate, regularity and rhythm. S1 and S2 normal.   Lungs  Clear to auscultation bilateraly, without wheezes/crackles/rhonchi. Good air movement.  Skin  No rash/lesions/breakdown  Psychiatry  Alert and oriented to person, place, and time  Abdomen  Normoactive bowel sounds, abdomen soft, non-tender and thin without evidence of hernia. Incisions healing well.  Back No CVA tenderness Genito Urinary  Surgically absent uterus, cervix. Vaginal cuff smooth, well healed. No lesions, no blood. Rectal  deferred Extremities  No bilateral cyanosis, clubbing or edema.   30 minutes of direct face to face counseling time was spent with the patient. This included discussion about prognosis, therapy recommendations and postoperative side effects and are beyond the scope of routine postoperative care.   Donaciano Eva, MD  12/06/2016, 3:42 PM

## 2016-12-08 ENCOUNTER — Encounter: Payer: Self-pay | Admitting: Hematology and Oncology

## 2016-12-08 ENCOUNTER — Telehealth: Payer: Self-pay | Admitting: *Deleted

## 2016-12-08 NOTE — Telephone Encounter (Signed)
Left message that she can pick up the note tomorrow.

## 2016-12-08 NOTE — Telephone Encounter (Signed)
DOne

## 2016-12-08 NOTE — Telephone Encounter (Signed)
Patient currently has note for her to stay out of work until 12/13/16. Patient needs a new note to extend her leave of absence. FMLA paperwork is in progress. Patient will be here for radiation tomorrow, so she is willing to pick up the letter at the front desk.

## 2016-12-09 ENCOUNTER — Ambulatory Visit
Admission: RE | Admit: 2016-12-09 | Discharge: 2016-12-09 | Disposition: A | Discharge status: Home / Self Care | Payer: BLUE CROSS/BLUE SHIELD | Source: Ambulatory Visit | Attending: Radiation Oncology | Admitting: Radiation Oncology

## 2016-12-09 ENCOUNTER — Encounter: Payer: Self-pay | Admitting: Radiation Oncology

## 2016-12-09 ENCOUNTER — Telehealth: Payer: Self-pay

## 2016-12-09 DIAGNOSIS — Z7984 Long term (current) use of oral hypoglycemic drugs: Secondary | ICD-10-CM | POA: Insufficient documentation

## 2016-12-09 DIAGNOSIS — Z90722 Acquired absence of ovaries, bilateral: Secondary | ICD-10-CM | POA: Diagnosis not present

## 2016-12-09 DIAGNOSIS — Z8249 Family history of ischemic heart disease and other diseases of the circulatory system: Secondary | ICD-10-CM | POA: Insufficient documentation

## 2016-12-09 DIAGNOSIS — Z79899 Other long term (current) drug therapy: Secondary | ICD-10-CM | POA: Diagnosis not present

## 2016-12-09 DIAGNOSIS — C541 Malignant neoplasm of endometrium: Secondary | ICD-10-CM | POA: Diagnosis not present

## 2016-12-09 DIAGNOSIS — Z90710 Acquired absence of both cervix and uterus: Secondary | ICD-10-CM | POA: Insufficient documentation

## 2016-12-09 DIAGNOSIS — Z8261 Family history of arthritis: Secondary | ICD-10-CM | POA: Insufficient documentation

## 2016-12-09 DIAGNOSIS — F419 Anxiety disorder, unspecified: Secondary | ICD-10-CM | POA: Diagnosis not present

## 2016-12-09 DIAGNOSIS — M1990 Unspecified osteoarthritis, unspecified site: Secondary | ICD-10-CM | POA: Diagnosis not present

## 2016-12-09 DIAGNOSIS — E119 Type 2 diabetes mellitus without complications: Secondary | ICD-10-CM | POA: Diagnosis not present

## 2016-12-09 DIAGNOSIS — Z85828 Personal history of other malignant neoplasm of skin: Secondary | ICD-10-CM | POA: Diagnosis not present

## 2016-12-09 DIAGNOSIS — C7961 Secondary malignant neoplasm of right ovary: Secondary | ICD-10-CM | POA: Insufficient documentation

## 2016-12-09 DIAGNOSIS — F17210 Nicotine dependence, cigarettes, uncomplicated: Secondary | ICD-10-CM | POA: Insufficient documentation

## 2016-12-09 DIAGNOSIS — Z809 Family history of malignant neoplasm, unspecified: Secondary | ICD-10-CM | POA: Insufficient documentation

## 2016-12-09 HISTORY — DX: Malignant neoplasm of endometrium: C54.1

## 2016-12-09 NOTE — Progress Notes (Signed)
Radiation Oncology         [phone] ________________________________  Initial Outpatient Consultation  Name: [name] MRN: [id number]  Date: 12/09/2016  DOB: 05/27/1966  PO:[id number] Hawks, FNP  [name], MD   REFERRING PHYSICIAN: [name], MD  DIAGNOSIS: Stage IIIA endometrial cancer (right ovarian involvement with deep myometrial uterine involvement)  HISTORY OF PRESENT ILLNESS::[name] is a 51 y.o. female who had a history of post-menopausal bleeding. A TVUS was performed on 10/13/16 revealing the uterus measuring 7.6 x 4.6 x 6.26 cm, endometrial lining measuring 2.24 cm, and normal ovaries. Ultrasound also showed a 4 cm intracavitary mass. Endometrial biopsy on 10/13/16 showed endometrial adenocarcinoma (FIGO grade 1). The patient underwent xi robotic assisted total hysterectomy with BSO on 11/16/16. This showed endometrioid adenocarcinoma, measuring 5.2 cm involving the deep myometrium. The right fallopian tube and right ovary were involved by endometrioid carcinoma. All sampled lymph nodes were benign (0/4). CT of the chest, abdomen, and pelvis on 11/29/16 showed no evidence of metastatic disease. The patient was seen by Dr. Denman George on 12/06/16. Per her note, she recommends 6 cycles of adjuvant carboplatin and paclitaxel with vaginal brachytherapy to reduce the risk of distal and local recurrence. Patient notes chemotherapy is expected to start 12/21/16.  Patient denies weight changes, bowel/bladder complaints, nausea/vomiting, or pain at this time.   PREVIOUS RADIATION THERAPY: No  PAST MEDICAL HISTORY:  has a past medical history of Anxiety; Arthritis; Basal cell carcinoma; Diabetes mellitus without complication ([place]); Endometrial cancer ([place]); and History of bronchitis.    PAST SURGICAL HISTORY: Past Surgical History:  Procedure Laterality Date  . BASAL CELL CARCINOMA EXCISION     face  . GANGLION CYST EXCISION Left 08/06/2014   Procedure: EXCISION  LEFT  WRIST GANGLION;  Surgeon: Leanora Cover, MD;  Location: [facility];  Service: Orthopedics;  Laterality: Left;  . KNEE SURGERY     right x3  . ROBOTIC ASSISTED TOTAL HYSTERECTOMY WITH BILATERAL SALPINGO OOPHERECTOMY Bilateral 11/16/2016   Procedure: XI ROBOTIC ASSISTED TOTAL HYSTERECTOMY WITH BILATERAL SALPINGO OOPHORECTOMY;  Surgeon: [name], MD;  Location: [facility];  Service: Gynecology;  Laterality: Bilateral;  . SENTINEL NODE BIOPSY N/A 11/16/2016   Procedure: SENTINEL NODE BIOPSY;  Surgeon: [name], MD;  Location: [facility];  Service: Gynecology;  Laterality: N/A;  . TOOTH EXTRACTION      FAMILY HISTORY: family history includes Arthritis in her sister; Cancer in her brother and father; Heart disease in her father; Hypertension in her mother.  SOCIAL HISTORY:  reports that she has been smoking Cigarettes.  She has a 34.00 pack-year smoking history. She has never used smokeless tobacco. She reports that she does not drink alcohol or use drugs.  ALLERGIES: Patient has no known allergies.  MEDICATIONS:  Current Outpatient Prescriptions  Medication Sig Dispense Refill  . cyclobenzaprine (FLEXERIL) 10 MG tablet Take 10 mg by mouth as needed for muscle spasms.    Marland Kitchen escitalopram (LEXAPRO) 10 MG tablet Take 1 tablet (10 mg total) by mouth daily. 90 tablet 3  . metFORMIN (GLUCOPHAGE-XR) 500 MG 24 hr tablet 1/2 po QD (Patient taking differently: Take 500 mg by mouth daily. ) 90 tablet 1  . ibuprofen (IBU) 600 MG tablet Take 1 tablet (600 mg total) by mouth every 6 (six) hours as needed. (Patient not taking: Reported on 12/09/2016) 30 tablet 0  . nicotine (NICODERM CQ - DOSED IN MG/24 HOURS) 21 mg/24hr patch Place 1 patch (21 mg total) onto the  skin daily. (Patient not taking: Reported on 12/09/2016) 28 patch 0  . oxyCODONE-acetaminophen (PERCOCET/ROXICET) 5-325 MG tablet Take 1-2 tablets by mouth every 4 (four) hours as needed (moderate to severe pain (when tolerating fluids)).  (Patient not taking: Reported on 12/09/2016) 30 tablet 0  . senna (SENOKOT) 8.6 MG TABS tablet Take 1 tablet (8.6 mg total) by mouth at bedtime. (Patient not taking: Reported on 12/09/2016) 120 each 0  . Vitamin D, Ergocalciferol, (DRISDOL) 50000 units CAPS capsule Take 1 capsule (50,000 Units total) by mouth once a week. (Patient not taking: Reported on 12/09/2016) 12 capsule 1   No current facility-administered medications for this encounter.     REVIEW OF SYSTEMS:  A 10 point review of systems is documented in the electronic medical record. This was obtained by the nursing staff. However, I reviewed this with the patient to discuss relevant findings and make appropriate changes.    PHYSICAL EXAM:  height is 5\' 8"  (1.727 m) and weight is 162 lb 12.8 oz (73.8 kg). Her oral temperature is 98.4 F (36.9 C). Her blood pressure is 131/88 and her pulse is 96. Her oxygen saturation is 99%.   General: Alert and oriented, in no acute distress HEENT: Head is normocephalic. Extraocular movements are intact. Oropharynx is clear. Neck: Neck is supple, no palpable cervical or supraclavicular lymphadenopathy. Heart: Regular in rate and rhythm with no murmurs, rubs, or gallops. Chest: Clear to auscultation bilaterally, with no rhonchi, wheezes, or rales. Abdomen: Soft, nontender, nondistended, with no rigidity or guarding. 5 small laparoscopic scars all healing well without signs of drainage or infection. Extremities: No cyanosis or edema. Lymphatics: see Neck Exam Skin: No concerning lesions. Musculoskeletal: symmetric strength and muscle tone throughout. Neurologic: Cranial nerves II through XII are grossly intact. No obvious focalities. Speech is fluent. Coordination is intact. Psychiatric: Judgment and insight are intact. Affect is appropriate. Pelvic exam deferred to simulation day  ECOG = 1   LABORATORY DATA:  Lab Results  Component Value Date   WBC 12.2 (H) 11/17/2016   HGB 12.1 11/17/2016    HCT 36.6 11/17/2016   MCV 89.7 11/17/2016   PLT 235 11/17/2016   NEUTROABS 3.6 02/13/2016   Lab Results  Component Value Date   NA 139 11/17/2016   K 4.1 11/17/2016   CL 105 11/17/2016   CO2 27 11/17/2016   GLUCOSE 200 (H) 11/17/2016   CREATININE 0.72 11/17/2016   CALCIUM 8.8 (L) 11/17/2016      RADIOGRAPHY: Ct Chest W Contrast  Result Date: 11/29/2016 CLINICAL DATA:  Staging of recently diagnosed endometrial cancer. No therapy yet. EXAM: CT CHEST, ABDOMEN, AND PELVIS WITH CONTRAST TECHNIQUE: Multidetector CT imaging of the chest, abdomen and pelvis was performed following the standard protocol during bolus administration of intravenous contrast. CONTRAST:  1 ISOVUE-300 IOPAMIDOL (ISOVUE-300) INJECTION 61% COMPARISON:  Noncontrast abdominopelvic CT 06/02/2016. FINDINGS: CT CHEST FINDINGS Cardiovascular: Mild atherosclerosis of the aorta, great vessels and coronary arteries. No acute vascular findings are demonstrated. The heart size is normal. There is no pericardial effusion. Mediastinum/Nodes: There are small mediastinal lymph nodes, including a 7 mm high right paratracheal node on image 10. No pathologically enlarged mediastinal, hilar or axillary lymph nodes are seen. There is a small hiatal hernia. The thyroid gland and trachea appear unremarkable. Lungs/Pleura: There is no pleural effusion. There are mild emphysematous changes with an upper lobe predominance. No suspicious pulmonary nodules. Musculoskeletal/Chest wall: No chest wall mass or suspicious osseous findings. CT ABDOMEN AND PELVIS FINDINGS Hepatobiliary: There  is a small low-density lesion anteriorly in the dome of the right hepatic lobe on image number 50. Although not well seen on the previous noncontrast study, this is probably not new. No worrisome hepatic findings. No evidence of gallstones, gallbladder wall thickening or biliary dilatation. Pancreas: Unremarkable. No pancreatic ductal dilatation or surrounding inflammatory  changes. Spleen: Tiny low-density lesion inferiorly in the spleen (image 67 of series 2) of doubtful significance. The spleen is normal in size without suspicious finding. Adrenals/Urinary Tract: Both adrenal glands appear normal. The kidneys appear normal without evidence of urinary tract calculus, suspicious lesion or hydronephrosis. No bladder abnormalities are seen. Stomach/Bowel: No evidence of bowel wall thickening, distention or surrounding inflammatory change. There are sigmoid colon diverticular changes without surrounding inflammation. The appendix appears normal. Vascular/Lymphatic: There are no enlarged abdominal or pelvic lymph nodes. Mild aortic and branch vessel atherosclerosis. No acute vascular findings. Reproductive: Interval hysterectomy. There is a small amount of pelvic free fluid and soft tissue edema. No adnexal mass or focal extraluminal fluid collection. Other: The anterior abdominal wall appears normal. There is no generalized ascites or peritoneal nodularity. Musculoskeletal: No acute or significant osseous findings. There is a stable sclerotic lesion within the left ischium (axial image 124). No lytic lesions are seen. IMPRESSION: 1. Interval hysterectomy.  No evidence of metastatic disease. 2. Postsurgical changes in the pelvis with minimal free fluid and soft tissue edema. 3. Stable colon diverticular changes without acute inflammation. 4. Suspected small incidental lesions within the liver and spleen. 5. Stable sclerotic lesion in the left ischium, likely benign. Electronically Signed   By: Richardean Sale M.D.   On: 11/29/2016 16:34   Ct Abdomen Pelvis W Contrast  Result Date: 11/29/2016 CLINICAL DATA:  Staging of recently diagnosed endometrial cancer. No therapy yet. EXAM: CT CHEST, ABDOMEN, AND PELVIS WITH CONTRAST TECHNIQUE: Multidetector CT imaging of the chest, abdomen and pelvis was performed following the standard protocol during bolus administration of intravenous  contrast. CONTRAST:  1 ISOVUE-300 IOPAMIDOL (ISOVUE-300) INJECTION 61% COMPARISON:  Noncontrast abdominopelvic CT 06/02/2016. FINDINGS: CT CHEST FINDINGS Cardiovascular: Mild atherosclerosis of the aorta, great vessels and coronary arteries. No acute vascular findings are demonstrated. The heart size is normal. There is no pericardial effusion. Mediastinum/Nodes: There are small mediastinal lymph nodes, including a 7 mm high right paratracheal node on image 10. No pathologically enlarged mediastinal, hilar or axillary lymph nodes are seen. There is a small hiatal hernia. The thyroid gland and trachea appear unremarkable. Lungs/Pleura: There is no pleural effusion. There are mild emphysematous changes with an upper lobe predominance. No suspicious pulmonary nodules. Musculoskeletal/Chest wall: No chest wall mass or suspicious osseous findings. CT ABDOMEN AND PELVIS FINDINGS Hepatobiliary: There is a small low-density lesion anteriorly in the dome of the right hepatic lobe on image number 50. Although not well seen on the previous noncontrast study, this is probably not new. No worrisome hepatic findings. No evidence of gallstones, gallbladder wall thickening or biliary dilatation. Pancreas: Unremarkable. No pancreatic ductal dilatation or surrounding inflammatory changes. Spleen: Tiny low-density lesion inferiorly in the spleen (image 67 of series 2) of doubtful significance. The spleen is normal in size without suspicious finding. Adrenals/Urinary Tract: Both adrenal glands appear normal. The kidneys appear normal without evidence of urinary tract calculus, suspicious lesion or hydronephrosis. No bladder abnormalities are seen. Stomach/Bowel: No evidence of bowel wall thickening, distention or surrounding inflammatory change. There are sigmoid colon diverticular changes without surrounding inflammation. The appendix appears normal. Vascular/Lymphatic: There are no enlarged abdominal or  pelvic lymph nodes. Mild  aortic and branch vessel atherosclerosis. No acute vascular findings. Reproductive: Interval hysterectomy. There is a small amount of pelvic free fluid and soft tissue edema. No adnexal mass or focal extraluminal fluid collection. Other: The anterior abdominal wall appears normal. There is no generalized ascites or peritoneal nodularity. Musculoskeletal: No acute or significant osseous findings. There is a stable sclerotic lesion within the left ischium (axial image 124). No lytic lesions are seen. IMPRESSION: 1. Interval hysterectomy.  No evidence of metastatic disease. 2. Postsurgical changes in the pelvis with minimal free fluid and soft tissue edema. 3. Stable colon diverticular changes without acute inflammation. 4. Suspected small incidental lesions within the liver and spleen. 5. Stable sclerotic lesion in the left ischium, likely benign. Electronically Signed   By: Richardean Sale M.D.   On: 11/29/2016 16:34      IMPRESSION: Stage IIIA endometrial cancer (right ovarian involvement with deep myometrial uterine involvement).  She would be at risk for vaginal cuff recurrence and I would agree with Dr. Serita Grit recommendations for vaginal vault brachytherapy. I discussed the course of treatment, side effects, and potential toxicities with the patient and her husband. She appears to understand and wishes to proceed with treatment. A consent form was signed and a copy was placed in the patient's chart.  PLAN: CT simulation and treatment planning will be scheduled after her second or third cycle of chemotherapy.    ------------------------------------------------  Blair Promise, PhD, MD  This document serves as a record of services personally performed by Gery Pray, MD. It was created on his behalf by Bethann Humble, a trained medical scribe. The creation of this record is based on the scribe's personal observations and the provider's statements to them. This document has been checked and approved by  the attending provider.

## 2016-12-09 NOTE — Telephone Encounter (Signed)
Note for work taken to front desk and given to [employer]. To give to patient today.

## 2016-12-14 ENCOUNTER — Other Ambulatory Visit: Payer: Self-pay | Admitting: Hematology and Oncology

## 2016-12-14 ENCOUNTER — Other Ambulatory Visit: Payer: Self-pay | Admitting: Radiology

## 2016-12-14 DIAGNOSIS — C541 Malignant neoplasm of endometrium: Secondary | ICD-10-CM

## 2016-12-15 ENCOUNTER — Ambulatory Visit (HOSPITAL_COMMUNITY)
Admission: RE | Admit: 2016-12-15 | Discharge: 2016-12-15 | Disposition: A | Discharge status: Home / Self Care | Payer: BLUE CROSS/BLUE SHIELD | Source: Ambulatory Visit | Attending: Hematology and Oncology | Admitting: Hematology and Oncology

## 2016-12-15 ENCOUNTER — Encounter: Payer: Self-pay | Admitting: Hematology and Oncology

## 2016-12-15 ENCOUNTER — Ambulatory Visit (HOSPITAL_BASED_OUTPATIENT_CLINIC_OR_DEPARTMENT_OTHER): Payer: BLUE CROSS/BLUE SHIELD | Admitting: Hematology and Oncology

## 2016-12-15 ENCOUNTER — Other Ambulatory Visit: Payer: Self-pay | Admitting: Hematology and Oncology

## 2016-12-15 ENCOUNTER — Telehealth: Payer: Self-pay | Admitting: Hematology and Oncology

## 2016-12-15 ENCOUNTER — Other Ambulatory Visit (HOSPITAL_BASED_OUTPATIENT_CLINIC_OR_DEPARTMENT_OTHER): Payer: BLUE CROSS/BLUE SHIELD

## 2016-12-15 ENCOUNTER — Other Ambulatory Visit: Payer: BLUE CROSS/BLUE SHIELD

## 2016-12-15 ENCOUNTER — Encounter: Payer: Self-pay | Admitting: *Deleted

## 2016-12-15 ENCOUNTER — Encounter (HOSPITAL_COMMUNITY): Payer: Self-pay

## 2016-12-15 VITALS — BP 138/73 | HR 80 | Temp 98.1°F | Resp 20 | Ht 68.0 in | Wt 163.5 lb

## 2016-12-15 DIAGNOSIS — Z720 Tobacco use: Secondary | ICD-10-CM

## 2016-12-15 DIAGNOSIS — C541 Malignant neoplasm of endometrium: Secondary | ICD-10-CM

## 2016-12-15 DIAGNOSIS — E1100 Type 2 diabetes mellitus with hyperosmolarity without nonketotic hyperglycemic-hyperosmolar coma (NKHHC): Secondary | ICD-10-CM | POA: Diagnosis not present

## 2016-12-15 DIAGNOSIS — F17210 Nicotine dependence, cigarettes, uncomplicated: Secondary | ICD-10-CM | POA: Diagnosis not present

## 2016-12-15 DIAGNOSIS — F419 Anxiety disorder, unspecified: Secondary | ICD-10-CM | POA: Diagnosis not present

## 2016-12-15 DIAGNOSIS — E119 Type 2 diabetes mellitus without complications: Secondary | ICD-10-CM | POA: Diagnosis not present

## 2016-12-15 DIAGNOSIS — Z5111 Encounter for antineoplastic chemotherapy: Secondary | ICD-10-CM | POA: Diagnosis not present

## 2016-12-15 DIAGNOSIS — Z8249 Family history of ischemic heart disease and other diseases of the circulatory system: Secondary | ICD-10-CM | POA: Diagnosis not present

## 2016-12-15 DIAGNOSIS — Z8542 Personal history of malignant neoplasm of other parts of uterus: Secondary | ICD-10-CM | POA: Diagnosis not present

## 2016-12-15 DIAGNOSIS — E559 Vitamin D deficiency, unspecified: Secondary | ICD-10-CM

## 2016-12-15 HISTORY — PX: IR US GUIDE VASC ACCESS RIGHT: IMG2390

## 2016-12-15 HISTORY — PX: IR FLUORO GUIDE PORT INSERTION RIGHT: IMG5741

## 2016-12-15 LAB — COMPREHENSIVE METABOLIC PANEL
ALBUMIN: 4.2 g/dL (ref 3.5–5.0)
ALK PHOS: 119 U/L (ref 40–150)
ALT: 28 U/L (ref 0–55)
ANION GAP: 12 meq/L — AB (ref 3–11)
AST: 19 U/L (ref 5–34)
BILIRUBIN TOTAL: 0.86 mg/dL (ref 0.20–1.20)
BUN: 9.6 mg/dL (ref 7.0–26.0)
CALCIUM: 9.9 mg/dL (ref 8.4–10.4)
CO2: 27 mEq/L (ref 22–29)
CREATININE: 0.8 mg/dL (ref 0.6–1.1)
Chloride: 105 mEq/L (ref 98–109)
EGFR: 84 mL/min/{1.73_m2} — ABNORMAL LOW (ref >90)
Glucose: 213 mg/dl — ABNORMAL HIGH (ref 70–140)
Potassium: 4.1 mEq/L (ref 3.5–5.1)
Sodium: 143 mEq/L (ref 136–145)
TOTAL PROTEIN: 7.5 g/dL (ref 6.4–8.3)

## 2016-12-15 LAB — PROTIME-INR
INR: 0.98
Prothrombin Time: 13 seconds (ref 11.4–15.2)

## 2016-12-15 LAB — CBC WITH DIFFERENTIAL/PLATELET
BASO%: 0.4 % (ref 0.0–2.0)
Basophils Absolute: 0 10*3/uL (ref 0.0–0.1)
EOS%: 2.2 % (ref 0.0–7.0)
Eosinophils Absolute: 0.1 10*3/uL (ref 0.0–0.5)
HCT: 45 % (ref 34.8–46.6)
HGB: 15.4 g/dL (ref 11.6–15.9)
LYMPH%: 31 % (ref 14.0–49.7)
MCH: 30.3 pg (ref 25.1–34.0)
MCHC: 34.3 g/dL (ref 31.5–36.0)
MCV: 88.5 fL (ref 79.5–101.0)
MONO#: 0.4 10*3/uL (ref 0.1–0.9)
MONO%: 5.5 % (ref 0.0–14.0)
NEUT%: 60.9 % (ref 38.4–76.8)
NEUTROS ABS: 4.1 10*3/uL (ref 1.5–6.5)
PLATELETS: 250 10*3/uL (ref 145–400)
RBC: 5.08 10*6/uL (ref 3.70–5.45)
RDW: 13.7 % (ref 11.2–14.5)
WBC: 6.7 10*3/uL (ref 3.9–10.3)
lymph#: 2.1 10*3/uL (ref 0.9–3.3)

## 2016-12-15 LAB — GLUCOSE, CAPILLARY: Glucose-Capillary: 150 mg/dL — ABNORMAL HIGH (ref 65–99)

## 2016-12-15 MED ORDER — LIDOCAINE-EPINEPHRINE (PF) 2 %-1:200000 IJ SOLN
INTRAMUSCULAR | Status: AC | PRN
  Administered 2016-12-15: 20 mL

## 2016-12-15 MED ORDER — DEXAMETHASONE 4 MG PO TABS: ORAL_TABLET | ORAL | 1 refills | Status: DC

## 2016-12-15 MED ORDER — PROCHLORPERAZINE MALEATE 10 MG PO TABS: 10.0000 mg | ORAL_TABLET | Freq: Four times a day (QID) | ORAL | 1 refills | Status: DC | PRN

## 2016-12-15 MED ORDER — FENTANYL CITRATE (PF) 100 MCG/2ML IJ SOLN
INTRAMUSCULAR | Status: AC | PRN
  Administered 2016-12-15 (×4): 50 ug via INTRAVENOUS

## 2016-12-15 MED ORDER — MIDAZOLAM HCL 2 MG/2ML IJ SOLN
INTRAMUSCULAR | Status: AC
  Filled 2016-12-15: qty 4

## 2016-12-15 MED ORDER — LIDOCAINE-EPINEPHRINE (PF) 2 %-1:200000 IJ SOLN
INTRAMUSCULAR | Status: AC
  Filled 2016-12-15: qty 20

## 2016-12-15 MED ORDER — FENTANYL CITRATE (PF) 100 MCG/2ML IJ SOLN
INTRAMUSCULAR | Status: AC
  Filled 2016-12-15: qty 4

## 2016-12-15 MED ORDER — FLUMAZENIL 0.5 MG/5ML IV SOLN
INTRAVENOUS | Status: AC
  Filled 2016-12-15: qty 5

## 2016-12-15 MED ORDER — CEFAZOLIN SODIUM-DEXTROSE 2-4 GM/100ML-% IV SOLN
2.0000 g | INTRAVENOUS | Status: AC
  Administered 2016-12-15: 2 g via INTRAVENOUS

## 2016-12-15 MED ORDER — ONDANSETRON HCL 8 MG PO TABS: 8.0000 mg | ORAL_TABLET | Freq: Two times a day (BID) | ORAL | 1 refills | Status: DC | PRN

## 2016-12-15 MED ORDER — MIDAZOLAM HCL 2 MG/2ML IJ SOLN
INTRAMUSCULAR | Status: AC | PRN
Start: 2016-12-15 — End: 2016-12-15
  Administered 2016-12-15 (×4): 1 mg via INTRAVENOUS

## 2016-12-15 MED ORDER — LIDOCAINE-PRILOCAINE 2.5-2.5 % EX CREA: TOPICAL_CREAM | CUTANEOUS | 3 refills | Status: DC

## 2016-12-15 MED ORDER — HEPARIN SOD (PORK) LOCK FLUSH 100 UNIT/ML IV SOLN
INTRAVENOUS | Status: AC
  Filled 2016-12-15: qty 5

## 2016-12-15 MED ORDER — NALOXONE HCL 0.4 MG/ML IJ SOLN
INTRAMUSCULAR | Status: AC
  Filled 2016-12-15: qty 1

## 2016-12-15 MED ORDER — SODIUM CHLORIDE 0.9 % IV SOLN
INTRAVENOUS | Status: DC
  Administered 2016-12-15: 12:00:00 via INTRAVENOUS

## 2016-12-15 MED ORDER — CEFAZOLIN SODIUM-DEXTROSE 2-4 GM/100ML-% IV SOLN
INTRAVENOUS | Status: AC
  Filled 2016-12-15: qty 100

## 2016-12-15 NOTE — Discharge Instructions (Signed)

## 2016-12-15 NOTE — H&P (Signed)
Chief Complaint: endometrial cancer  Referring Physician:Dr. Heath Lark  Supervising Physician: Sandi Mariscal  Patient Status: [facility] - Out-pt  HPI: [name] is a 51 y.o. female who was just diagnosed with endometrial cancer earlier this month.  She then underwent a total abdominal hysterectomy with BSO.  She is planning to start chemotherapy next Tuesday and has been seen by radiation to start radiation therapy as well.  She has been sent today for placement of a port a cath for her chemotherapy treatment.  She has no complaints.  She denies abdominal pain, CP, SOB, fevers, or chills.   Past Medical History:  Past Medical History:  Diagnosis Date  . Anxiety   . Arthritis   . Basal cell carcinoma    face  . Diabetes mellitus without complication ([place])    type II   . Endometrial cancer ([place])   . History of bronchitis     Past Surgical History:  Past Surgical History:  Procedure Laterality Date  . BASAL CELL CARCINOMA EXCISION     face  . GANGLION CYST EXCISION Left 08/06/2014   Procedure: EXCISION LEFT  WRIST GANGLION;  Surgeon: Leanora Cover, MD;  Location: [facility];  Service: Orthopedics;  Laterality: Left;  . KNEE SURGERY     right x3  . ROBOTIC ASSISTED TOTAL HYSTERECTOMY WITH BILATERAL SALPINGO OOPHERECTOMY Bilateral 11/16/2016   Procedure: XI ROBOTIC ASSISTED TOTAL HYSTERECTOMY WITH BILATERAL SALPINGO OOPHORECTOMY;  Surgeon: [name], MD;  Location: [facility];  Service: Gynecology;  Laterality: Bilateral;  . SENTINEL NODE BIOPSY N/A 11/16/2016   Procedure: SENTINEL NODE BIOPSY;  Surgeon: [name], MD;  Location: [facility];  Service: Gynecology;  Laterality: N/A;  . TOOTH EXTRACTION      Family History:  Family History  Problem Relation Age of Onset  . Hypertension Mother   . Heart disease Father   . Cancer Father     lung ca  . Arthritis Sister     rheumatoid  . Cancer Brother     prostate    Social History:  reports that she has been  smoking Cigarettes.  She has a 34.00 pack-year smoking history. She has never used smokeless tobacco. She reports that she does not drink alcohol or use drugs.  Allergies: No Known Allergies  Medications: Medications reviewed in epic  Please HPI for pertinent positives, otherwise complete 10 system ROS negative.  Mallampati Score: MD Evaluation Airway: WNL Heart: WNL Abdomen: WNL Chest/ Lungs: WNL ASA  Classification: 2 Mallampati/Airway Score: One  Physical Exam: BP 131/72   Pulse 73   Temp 98.1 F (36.7 C) (Oral)   Resp 18   Ht 5\' 8"  (1.727 m)   Wt 163 lb 8 oz (74.2 kg)   LMP 07/25/2014 Comment: perimenopausal  SpO2 99%   BMI 24.86 kg/m  Body mass index is 24.86 kg/m. General: pleasant, WD, WN white female who is laying in bed in NAD HEENT: head is normocephalic, atraumatic.  Sclera are noninjected.  PERRL.  Ears and nose without any masses or lesions.  Mouth is pink and moist, poor dentition Heart: regular, rate, and rhythm.  Normal s1,s2. No obvious murmurs, gallops, or rubs noted.  Palpable radial pulses bilaterally Lungs: CTAB, no wheezes, rhonchi, or rales noted.  Respiratory effort nonlabored Abd: soft, NT, ND, +BS, no masses, hernias, or organomegaly Psych: A&Ox3 with an appropriate affect.   Labs: Results for orders placed or performed during the hospital encounter of 12/15/16 (from the past  48 hour(s))  Protime-INR     Status: None   Collection Time: 12/15/16 11:48 AM  Result Value Ref Range   Prothrombin Time 13.0 11.4 - 15.2 seconds   INR 0.98     Imaging: No results found.  Assessment/Plan 1. Endometrial cancer We will proceed with port a cath placement today.  Her labs and vitals have been reviewed.  She is to start chemotherapy next week. Risks and Benefits discussed with the patient including, but not limited to bleeding, infection, pneumothorax, or fibrin sheath development and need for additional procedures. All of the patient's questions were  answered, patient is agreeable to proceed. Consent signed and in chart.   Thank you for this interesting consult.  I greatly enjoyed meeting [name] and look forward to participating in their care.  A copy of this report was sent to the requesting provider on this date.  Electronically Signed: Henreitta Cea 12/15/2016, 12:46 PM   I spent a total of  30 Minutes   in face to face in clinical consultation, greater than 50% of which was counseling/coordinating care for endometrial cancer

## 2016-12-15 NOTE — Procedures (Signed)
Pre Procedure Dx: Endometrial cancer. Post Procedural Dx: Same  Successful placement of right IJ approach port-a-cath with tip at the superior caval atrial junction. The catheter is ready for immediate use.  Estimated Blood Loss: Minimal  Complications: None immediate.  Jay Danielle Lento, MD Pager #: [phone]   

## 2016-12-15 NOTE — Telephone Encounter (Signed)
Scheduled appt per LOS and Dr. Alvy Bimler - Unable to schedule appt for 5/29 for treatment due to availability in the treatment area - per Dr. Alvy Bimler - okay to move treatment to from 5/29 to 5/30- Per Dr. Alvy Bimler 5/2 LOS 6/1 inj stays the same and 6/19 changed to 6/20 and 6/21 inj changed to 6/22.

## 2016-12-15 NOTE — Sedation Documentation (Signed)
Pt grimacing. Pt states she is in pain. Meds given. Will continue to monitor. Roselyn Reef Dua Mehler,RN

## 2016-12-15 NOTE — Progress Notes (Signed)
Met with patient and spouse after chemo ed class. Introduced myself as Arboriculturist and asked patient if she knows if she has met her OOP for her insurance. Patient states she is sure she has. Advised her of the co-pay assistance program through [place] for Neulasta. Advised patient that they do retro back 6 months if she receives a bill for it after insurance has paid and she would like to apply later. Patient verbalized understanding. Asked patient about income to determine any assistance she may need. Patient states she had the only source of income and is now receiving short term disability. Advised patient of two grants she may apply [employer] and [employer] based on her diagnosis. Patient provided me with verbal income and she qualifies for both grants. Patient signed and was given a copy of the approval as well as the expenses it covers along with the outpatient pharmacy information. Explained to patient how grant works. Offered patient a gas card today from her grant and she accepted to help with transportation for her visit next week. Patient has my card for any additional financial questions or concerns.

## 2016-12-16 ENCOUNTER — Encounter: Payer: Self-pay | Admitting: Hematology and Oncology

## 2016-12-16 NOTE — Assessment & Plan Note (Signed)
The patient has high blood sugar. I recommend she continues metformin therapy Due to risk of heart disease, I also recommend she start 81 mg aspirin daily

## 2016-12-16 NOTE — Assessment & Plan Note (Signed)
We discussed the role of adjuvant chemotherapy. The patient may benefit from adjuvant radiation therapy as well The plan would be to proceed with combination of carboplatin and Taxol, every 3 weeks for 6 cycles total We discussed the role of chemotherapy. The intent is of curative intent.  We discussed some of the risks, benefits, side-effects of carboplatin and Taxol  Some of the short term side-effects included, though not limited to, including weight loss, life threatening infections, risk of allergic reactions, need for transfusions of blood products, nausea, vomiting, change in bowel habits, loss of hair, admission to hospital for various reasons, and risks of death.   Long term side-effects are also discussed including risks of infertility, permanent damage to nerve function, hearing loss, chronic fatigue, kidney damage with possibility needing hemodialysis, and rare secondary malignancy including bone marrow disorders.  The patient is aware that the response rates discussed earlier is not guaranteed.  After a long discussion, patient made an informed decision to proceed with the prescribed plan of care.   Patient education material was dispensed. We discussed the use of premedications before treatment I warned her about risk of hyperglycemia during treatment She agree with the plan of care

## 2016-12-16 NOTE — Progress Notes (Signed)
[facility] OFFICE PROGRESS NOTE  Patient Care Team: [name], [place] as PCP - General (Nurse Practitioner)  SUMMARY OF ONCOLOGIC HISTORY:   Endometrial cancer ([place])   09/22/2016 Initial Diagnosis    She presented to her physician with postmenopausal bleeding      10/13/2016 Imaging    The patient reported post-menopausal bleeding to her physician who performed a TVUS on 10/13/16. This showed a uterus measuring 7.6x4.6x 6.26cm. The endometrial lining measuringed 2.24cm. The ovaries were normal. A 4cm intracavitary mass was appreciated on [place].       10/13/2016 Pathology Results    The patient then underwent endometrial pipelle biopsy on 10/13/16. It revealed an endometrioid adenocarcinoma, (FIGO grade 1).       11/16/2016 Pathology Results    1. Lymph node, sentinel, biopsy, right obturator - TWO BENIGN LYMPH NODES (0/2). 2. Lymph node, sentinel, biopsy, right external iliac - ONE BENIGN LYMPH NODE (0/1). 3. Lymph node, sentinel, biopsy, left obturator - ONE BENIGN LYMPH NODE (0/1). 4. Uterus +/- tubes/ovaries, neoplastic - ENDOMETRIOID ADENOCARCINOMA, 5.2 CM. - CARCINOMA FOCALLY INVOLVES DEEP MYOMETRIUM. - RIGHT FALLOPIAN TUBE AND RIGHT OVARY INVOLVED BY ENDOMETRIOID CARCINOMA. - CERVIX, LEFT FALLOPIAN, AND LEFT OVARY FREE OF TUMOR. - SEE ONCOLOGY TABLE AND COMMENT.      11/16/2016 Surgery    Operation: Robotic-assisted laparoscopic total hysterectomy with bilateral salpingoophorectomy with bilateral SLN biopsy  Surgeon: Donaciano Eva  Operative Findings:  : 7cm normal appearing uterus, normal appearing tubes and ovaries, no suspicious nodes.      11/29/2016 Imaging    1. Interval hysterectomy.  No evidence of metastatic disease. 2. Postsurgical changes in the pelvis with minimal free fluid and soft tissue edema. 3. Stable colon diverticular changes without acute inflammation. 4. Suspected small incidental lesions within the liver and spleen. 5.  Stable sclerotic lesion in the left ischium, likely benign      12/15/2016 Procedure    Successful placement of a right internal jugular approach power injectable Port-A-Cath. The catheter is ready for immediate use       INTERVAL HISTORY: Please see below for problem oriented charting. She returns for further follow-up Her husband is present She is attempting to quit smoking She is healing well from recent surgery  REVIEW OF SYSTEMS:   Constitutional: Denies fevers, chills or abnormal weight loss Eyes: Denies blurriness of vision Ears, nose, mouth, throat, and face: Denies mucositis or sore throat Respiratory: Denies cough, dyspnea or wheezes Cardiovascular: Denies palpitation, chest discomfort or lower extremity swelling Gastrointestinal:  Denies nausea, heartburn or change in bowel habits Skin: Denies abnormal skin rashes Lymphatics: Denies new lymphadenopathy or easy bruising Neurological:Denies numbness, tingling or new weaknesses Behavioral/Psych: Mood is stable, no new changes  All other systems were reviewed with the patient and are negative.  I have reviewed the past medical history, past surgical history, social history and family history with the patient and they are unchanged from previous note.  ALLERGIES:  has No Known Allergies.  MEDICATIONS:  Current Outpatient Prescriptions  Medication Sig Dispense Refill  . escitalopram (LEXAPRO) 10 MG tablet Take 1 tablet (10 mg total) by mouth daily. 90 tablet 3  . metFORMIN (GLUCOPHAGE-XR) 500 MG 24 hr tablet 1/2 po QD (Patient taking differently: Take 500 mg by mouth daily. ) 90 tablet 1  . cyclobenzaprine (FLEXERIL) 10 MG tablet Take 10 mg by mouth as needed for muscle spasms.    Marland Kitchen dexamethasone (DECADRON) 4 MG tablet Take 5 tablets at  midnight and 5 tablets at 6 am on the day of chemotherapy, every 3 weeks 30 tablet 1  . ibuprofen (IBU) 600 MG tablet Take 1 tablet (600 mg total) by mouth every 6 (six) hours as needed.  (Patient not taking: Reported on 12/09/2016) 30 tablet 0  . lidocaine-prilocaine (EMLA) cream Apply to affected area once 30 g 3  . nicotine (NICODERM CQ - DOSED IN MG/24 HOURS) 21 mg/24hr patch Place 1 patch (21 mg total) onto the skin daily. (Patient not taking: Reported on 12/09/2016) 28 patch 0  . ondansetron (ZOFRAN) 8 MG tablet Take 1 tablet (8 mg total) by mouth 2 (two) times daily as needed for refractory nausea / vomiting. Start on day 3 after chemo. 30 tablet 1  . oxyCODONE-acetaminophen (PERCOCET/ROXICET) 5-325 MG tablet Take 1-2 tablets by mouth every 4 (four) hours as needed (moderate to severe pain (when tolerating fluids)). (Patient not taking: Reported on 12/09/2016) 30 tablet 0  . prochlorperazine (COMPAZINE) 10 MG tablet Take 1 tablet (10 mg total) by mouth every 6 (six) hours as needed (Nausea or vomiting). 30 tablet 1  . senna (SENOKOT) 8.6 MG TABS tablet Take 1 tablet (8.6 mg total) by mouth at bedtime. (Patient not taking: Reported on 12/09/2016) 120 each 0  . Vitamin D, Ergocalciferol, (DRISDOL) 50000 units CAPS capsule Take 1 capsule (50,000 Units total) by mouth once a week. (Patient not taking: Reported on 12/09/2016) 12 capsule 1   No current facility-administered medications for this visit.     PHYSICAL EXAMINATION: ECOG PERFORMANCE STATUS: 0 - Asymptomatic  Vitals:   12/15/16 0848  BP: 138/73  Pulse: 80  Resp: 20  Temp: 98.1 F (36.7 C)   Filed Weights   12/15/16 0848  Weight: 163 lb 8 oz (74.2 kg)    GENERAL:alert, no distress and comfortable SKIN: skin color, texture, turgor are normal, no rashes or significant lesions EYES: normal, Conjunctiva are pink and non-injected, sclera clear OROPHARYNX:no exudate, no erythema and lips, buccal mucosa, and tongue normal  NECK: supple, thyroid normal size, non-tender, without nodularity LYMPH:  no palpable lymphadenopathy in the cervical, axillary or inguinal LUNGS: clear to auscultation and percussion with normal  breathing effort HEART: regular rate & rhythm and no murmurs and no lower extremity edema ABDOMEN:abdomen soft, non-tender and normal bowel sounds Musculoskeletal:no cyanosis of digits and no clubbing  NEURO: alert & oriented x 3 with fluent speech, no focal motor/sensory deficits  LABORATORY DATA:  I have reviewed the data as listed    Component Value Date/Time   NA 143 12/15/2016 0824   K 4.1 12/15/2016 0824   CL 105 11/17/2016 0451   CO2 27 12/15/2016 0824   GLUCOSE 213 (H) 12/15/2016 0824   BUN 9.6 12/15/2016 0824   CREATININE 0.8 12/15/2016 0824   CALCIUM 9.9 12/15/2016 0824   PROT 7.5 12/15/2016 0824   ALBUMIN 4.2 12/15/2016 0824   AST 19 12/15/2016 0824   ALT 28 12/15/2016 0824   ALKPHOS 119 12/15/2016 0824   BILITOT 0.86 12/15/2016 0824   GFRNONAA >60 11/17/2016 0451   GFRAA >60 11/17/2016 0451    No results found for: SPEP, UPEP  Lab Results  Component Value Date   WBC 6.7 12/15/2016   NEUTROABS 4.1 12/15/2016   HGB 15.4 12/15/2016   HCT 45.0 12/15/2016   MCV 88.5 12/15/2016   PLT 250 12/15/2016      Chemistry      Component Value Date/Time   NA 143 12/15/2016 0824   K  4.1 12/15/2016 0824   CL 105 11/17/2016 0451   CO2 27 12/15/2016 0824   BUN 9.6 12/15/2016 0824   CREATININE 0.8 12/15/2016 0824      Component Value Date/Time   CALCIUM 9.9 12/15/2016 0824   ALKPHOS 119 12/15/2016 0824   AST 19 12/15/2016 0824   ALT 28 12/15/2016 0824   BILITOT 0.86 12/15/2016 0824       RADIOGRAPHIC STUDIES: I have personally reviewed the radiological images as listed and agreed with the findings in the report. Ct Chest W Contrast  Result Date: 11/29/2016 CLINICAL DATA:  Staging of recently diagnosed endometrial cancer. No therapy yet. EXAM: CT CHEST, ABDOMEN, AND PELVIS WITH CONTRAST TECHNIQUE: Multidetector CT imaging of the chest, abdomen and pelvis was performed following the standard protocol during bolus administration of intravenous contrast. CONTRAST:   1 ISOVUE-300 IOPAMIDOL (ISOVUE-300) INJECTION 61% COMPARISON:  Noncontrast abdominopelvic CT 06/02/2016. FINDINGS: CT CHEST FINDINGS Cardiovascular: Mild atherosclerosis of the aorta, great vessels and coronary arteries. No acute vascular findings are demonstrated. The heart size is normal. There is no pericardial effusion. Mediastinum/Nodes: There are small mediastinal lymph nodes, including a 7 mm high right paratracheal node on image 10. No pathologically enlarged mediastinal, hilar or axillary lymph nodes are seen. There is a small hiatal hernia. The thyroid gland and trachea appear unremarkable. Lungs/Pleura: There is no pleural effusion. There are mild emphysematous changes with an upper lobe predominance. No suspicious pulmonary nodules. Musculoskeletal/Chest wall: No chest wall mass or suspicious osseous findings. CT ABDOMEN AND PELVIS FINDINGS Hepatobiliary: There is a small low-density lesion anteriorly in the dome of the right hepatic lobe on image number 50. Although not well seen on the previous noncontrast study, this is probably not new. No worrisome hepatic findings. No evidence of gallstones, gallbladder wall thickening or biliary dilatation. Pancreas: Unremarkable. No pancreatic ductal dilatation or surrounding inflammatory changes. Spleen: Tiny low-density lesion inferiorly in the spleen (image 67 of series 2) of doubtful significance. The spleen is normal in size without suspicious finding. Adrenals/Urinary Tract: Both adrenal glands appear normal. The kidneys appear normal without evidence of urinary tract calculus, suspicious lesion or hydronephrosis. No bladder abnormalities are seen. Stomach/Bowel: No evidence of bowel wall thickening, distention or surrounding inflammatory change. There are sigmoid colon diverticular changes without surrounding inflammation. The appendix appears normal. Vascular/Lymphatic: There are no enlarged abdominal or pelvic lymph nodes. Mild aortic and branch vessel  atherosclerosis. No acute vascular findings. Reproductive: Interval hysterectomy. There is a small amount of pelvic free fluid and soft tissue edema. No adnexal mass or focal extraluminal fluid collection. Other: The anterior abdominal wall appears normal. There is no generalized ascites or peritoneal nodularity. Musculoskeletal: No acute or significant osseous findings. There is a stable sclerotic lesion within the left ischium (axial image 124). No lytic lesions are seen. IMPRESSION: 1. Interval hysterectomy.  No evidence of metastatic disease. 2. Postsurgical changes in the pelvis with minimal free fluid and soft tissue edema. 3. Stable colon diverticular changes without acute inflammation. 4. Suspected small incidental lesions within the liver and spleen. 5. Stable sclerotic lesion in the left ischium, likely benign. Electronically Signed   By: Richardean Sale M.D.   On: 11/29/2016 16:34   Ct Abdomen Pelvis W Contrast  Result Date: 11/29/2016 CLINICAL DATA:  Staging of recently diagnosed endometrial cancer. No therapy yet. EXAM: CT CHEST, ABDOMEN, AND PELVIS WITH CONTRAST TECHNIQUE: Multidetector CT imaging of the chest, abdomen and pelvis was performed following the standard protocol during bolus administration of intravenous  contrast. CONTRAST:  1 ISOVUE-300 IOPAMIDOL (ISOVUE-300) INJECTION 61% COMPARISON:  Noncontrast abdominopelvic CT 06/02/2016. FINDINGS: CT CHEST FINDINGS Cardiovascular: Mild atherosclerosis of the aorta, great vessels and coronary arteries. No acute vascular findings are demonstrated. The heart size is normal. There is no pericardial effusion. Mediastinum/Nodes: There are small mediastinal lymph nodes, including a 7 mm high right paratracheal node on image 10. No pathologically enlarged mediastinal, hilar or axillary lymph nodes are seen. There is a small hiatal hernia. The thyroid gland and trachea appear unremarkable. Lungs/Pleura: There is no pleural effusion. There are mild  emphysematous changes with an upper lobe predominance. No suspicious pulmonary nodules. Musculoskeletal/Chest wall: No chest wall mass or suspicious osseous findings. CT ABDOMEN AND PELVIS FINDINGS Hepatobiliary: There is a small low-density lesion anteriorly in the dome of the right hepatic lobe on image number 50. Although not well seen on the previous noncontrast study, this is probably not new. No worrisome hepatic findings. No evidence of gallstones, gallbladder wall thickening or biliary dilatation. Pancreas: Unremarkable. No pancreatic ductal dilatation or surrounding inflammatory changes. Spleen: Tiny low-density lesion inferiorly in the spleen (image 67 of series 2) of doubtful significance. The spleen is normal in size without suspicious finding. Adrenals/Urinary Tract: Both adrenal glands appear normal. The kidneys appear normal without evidence of urinary tract calculus, suspicious lesion or hydronephrosis. No bladder abnormalities are seen. Stomach/Bowel: No evidence of bowel wall thickening, distention or surrounding inflammatory change. There are sigmoid colon diverticular changes without surrounding inflammation. The appendix appears normal. Vascular/Lymphatic: There are no enlarged abdominal or pelvic lymph nodes. Mild aortic and branch vessel atherosclerosis. No acute vascular findings. Reproductive: Interval hysterectomy. There is a small amount of pelvic free fluid and soft tissue edema. No adnexal mass or focal extraluminal fluid collection. Other: The anterior abdominal wall appears normal. There is no generalized ascites or peritoneal nodularity. Musculoskeletal: No acute or significant osseous findings. There is a stable sclerotic lesion within the left ischium (axial image 124). No lytic lesions are seen. IMPRESSION: 1. Interval hysterectomy.  No evidence of metastatic disease. 2. Postsurgical changes in the pelvis with minimal free fluid and soft tissue edema. 3. Stable colon diverticular  changes without acute inflammation. 4. Suspected small incidental lesions within the liver and spleen. 5. Stable sclerotic lesion in the left ischium, likely benign. Electronically Signed   By: Richardean Sale M.D.   On: 11/29/2016 16:34   Ir US Guide Vasc Access Right  Result Date: 12/15/2016 INDICATION: History of endometrial cancer. In need of durable intravenous access for chemotherapy administration. EXAM: IMPLANTED PORT A CATH PLACEMENT WITH ULTRASOUND AND FLUOROSCOPIC GUIDANCE COMPARISON:  CT the chest, abdomen and pelvis - 11/29/2016 MEDICATIONS: Ancef 2 gm IV; The antibiotic was administered within an appropriate time interval prior to skin puncture. ANESTHESIA/SEDATION: Moderate (conscious) sedation was employed during this procedure. A total of Versed 4 mg and Fentanyl 200 mcg was administered intravenously. Moderate Sedation Time: 28 minutes. The patient's level of consciousness and vital signs were monitored continuously by radiology nursing throughout the procedure under my direct supervision. CONTRAST:  None FLUOROSCOPY TIME:  24 seconds (3.9 MGy) COMPLICATIONS: None immediate. PROCEDURE: The procedure, risks, benefits, and alternatives were explained to the patient. Questions regarding the procedure were encouraged and answered. The patient understands and consents to the procedure. The right neck and chest were prepped with chlorhexidine in a sterile fashion, and a sterile drape was applied covering the operative field. Maximum barrier sterile technique with sterile gowns and gloves were used for the procedure.  A timeout was performed prior to the initiation of the procedure. Local anesthesia was provided with 1% lidocaine with epinephrine. After creating a small venotomy incision, a micropuncture kit was utilized to access the internal jugular vein under direct, real-time ultrasound guidance. Ultrasound image documentation was performed. The microwire was kinked to measure appropriate catheter  length. A subcutaneous port pocket was then created along the upper chest wall utilizing a combination of sharp and blunt dissection. The pocket was irrigated with sterile saline. A single lumen ISP power injectable port was chosen for placement. The 8 Fr catheter was tunneled from the port pocket site to the venotomy incision. The port was placed in the pocket. The external catheter was trimmed to appropriate length. At the venotomy, an 8 Fr peel-away sheath was placed over a guidewire under fluoroscopic guidance. The catheter was then placed through the sheath and the sheath was removed. Final catheter positioning was confirmed and documented with a fluoroscopic spot radiograph. The port was accessed with a Huber needle, aspirated and flushed with heparinized saline. The venotomy site was closed with an interrupted 4-0 Vicryl suture. The port pocket incision was closed with interrupted 2-0 Vicryl suture and the skin was opposed with a running subcuticular 4-0 Vicryl suture. Dermabond and Steri-strips were applied to both incisions. Dressings were placed. The patient tolerated the procedure well without immediate post procedural complication. FINDINGS: After catheter placement, the tip lies within the superior cavoatrial junction. The catheter aspirates and flushes normally and is ready for immediate use. IMPRESSION: Successful placement of a right internal jugular approach power injectable Port-A-Cath. The catheter is ready for immediate use. Electronically Signed   By: Sandi Mariscal M.D.   On: 12/15/2016 14:59   Ir Fluoro Guide Port Insertion Right  Result Date: 12/15/2016 INDICATION: History of endometrial cancer. In need of durable intravenous access for chemotherapy administration. EXAM: IMPLANTED PORT A CATH PLACEMENT WITH ULTRASOUND AND FLUOROSCOPIC GUIDANCE COMPARISON:  CT the chest, abdomen and pelvis - 11/29/2016 MEDICATIONS: Ancef 2 gm IV; The antibiotic was administered within an appropriate time  interval prior to skin puncture. ANESTHESIA/SEDATION: Moderate (conscious) sedation was employed during this procedure. A total of Versed 4 mg and Fentanyl 200 mcg was administered intravenously. Moderate Sedation Time: 28 minutes. The patient's level of consciousness and vital signs were monitored continuously by radiology nursing throughout the procedure under my direct supervision. CONTRAST:  None FLUOROSCOPY TIME:  24 seconds (3.9 MGy) COMPLICATIONS: None immediate. PROCEDURE: The procedure, risks, benefits, and alternatives were explained to the patient. Questions regarding the procedure were encouraged and answered. The patient understands and consents to the procedure. The right neck and chest were prepped with chlorhexidine in a sterile fashion, and a sterile drape was applied covering the operative field. Maximum barrier sterile technique with sterile gowns and gloves were used for the procedure. A timeout was performed prior to the initiation of the procedure. Local anesthesia was provided with 1% lidocaine with epinephrine. After creating a small venotomy incision, a micropuncture kit was utilized to access the internal jugular vein under direct, real-time ultrasound guidance. Ultrasound image documentation was performed. The microwire was kinked to measure appropriate catheter length. A subcutaneous port pocket was then created along the upper chest wall utilizing a combination of sharp and blunt dissection. The pocket was irrigated with sterile saline. A single lumen ISP power injectable port was chosen for placement. The 8 Fr catheter was tunneled from the port pocket site to the venotomy incision. The port was placed in the  pocket. The external catheter was trimmed to appropriate length. At the venotomy, an 8 Fr peel-away sheath was placed over a guidewire under fluoroscopic guidance. The catheter was then placed through the sheath and the sheath was removed. Final catheter positioning was confirmed  and documented with a fluoroscopic spot radiograph. The port was accessed with a Huber needle, aspirated and flushed with heparinized saline. The venotomy site was closed with an interrupted 4-0 Vicryl suture. The port pocket incision was closed with interrupted 2-0 Vicryl suture and the skin was opposed with a running subcuticular 4-0 Vicryl suture. Dermabond and Steri-strips were applied to both incisions. Dressings were placed. The patient tolerated the procedure well without immediate post procedural complication. FINDINGS: After catheter placement, the tip lies within the superior cavoatrial junction. The catheter aspirates and flushes normally and is ready for immediate use. IMPRESSION: Successful placement of a right internal jugular approach power injectable Port-A-Cath. The catheter is ready for immediate use. Electronically Signed   By: Sandi Mariscal M.D.   On: 12/15/2016 14:59    ASSESSMENT & PLAN:  Endometrial cancer ([place]) We discussed the role of adjuvant chemotherapy. The patient may benefit from adjuvant radiation therapy as well The plan would be to proceed with combination of carboplatin and Taxol, every 3 weeks for 6 cycles total We discussed the role of chemotherapy. The intent is of curative intent.  We discussed some of the risks, benefits, side-effects of carboplatin and Taxol  Some of the short term side-effects included, though not limited to, including weight loss, life threatening infections, risk of allergic reactions, need for transfusions of blood products, nausea, vomiting, change in bowel habits, loss of hair, admission to hospital for various reasons, and risks of death.   Long term side-effects are also discussed including risks of infertility, permanent damage to nerve function, hearing loss, chronic fatigue, kidney damage with possibility needing hemodialysis, and rare secondary malignancy including bone marrow disorders.  The patient is aware that the response rates  discussed earlier is not guaranteed.  After a long discussion, patient made an informed decision to proceed with the prescribed plan of care.   Patient education material was dispensed. We discussed the use of premedications before treatment I warned her about risk of hyperglycemia during treatment She agree with the plan of care  Tobacco user I spent some time counseling the patient the importance of tobacco cessation. We discussed common strategies including nicotine patches, Tobacco Quit-line, and other nicotine replacement products to assist in hereffort to quit  she appears motivated to quit. I gave her prescription of nicotine patch to try She is attempting to quit  Type 2 diabetes mellitus ([place]) The patient has high blood sugar. I recommend she continues metformin therapy Due to risk of heart disease, I also recommend she start 81 mg aspirin daily   Orders Placed This Encounter  Procedures  . Vitamin D 25 hydroxy    Standing Status:   Future    Standing Expiration Date:   12/15/2017   All questions were answered. The patient knows to call the clinic with any problems, questions or concerns. No barriers to learning was detected. I spent 25 minutes counseling the patient face to face. The total time spent in the appointment was 40 minutes and more than 50% was on counseling and review of test results     Heath Lark, MD 12/16/2016 6:29 AM

## 2016-12-16 NOTE — Assessment & Plan Note (Signed)
I spent some time counseling the patient the importance of tobacco cessation. We discussed common strategies including nicotine patches, Tobacco Quit-line, and other nicotine replacement products to assist in hereffort to quit  she appears motivated to quit. I gave her prescription of nicotine patch to try She is attempting to quit

## 2016-12-17 ENCOUNTER — Other Ambulatory Visit: Payer: Self-pay | Admitting: *Deleted

## 2016-12-17 MED ORDER — IBUPROFEN 600 MG PO TABS: 600.0000 mg | ORAL_TABLET | Freq: Four times a day (QID) | ORAL | 0 refills | Status: DC | PRN

## 2016-12-21 ENCOUNTER — Ambulatory Visit (HOSPITAL_BASED_OUTPATIENT_CLINIC_OR_DEPARTMENT_OTHER): Payer: BLUE CROSS/BLUE SHIELD

## 2016-12-21 VITALS — BP 134/61 | HR 82 | Temp 97.8°F | Resp 17

## 2016-12-21 DIAGNOSIS — C541 Malignant neoplasm of endometrium: Secondary | ICD-10-CM

## 2016-12-21 DIAGNOSIS — Z5111 Encounter for antineoplastic chemotherapy: Secondary | ICD-10-CM | POA: Diagnosis not present

## 2016-12-21 MED ORDER — PALONOSETRON HCL INJECTION 0.25 MG/5ML
0.2500 mg | Freq: Once | INTRAVENOUS | Status: AC
  Administered 2016-12-21: 0.25 mg via INTRAVENOUS

## 2016-12-21 MED ORDER — SODIUM CHLORIDE 0.9% FLUSH
10.0000 mL | INTRAVENOUS | Status: DC | PRN
  Administered 2016-12-21: 10 mL
  Filled 2016-12-21: qty 10

## 2016-12-21 MED ORDER — HEPARIN SOD (PORK) LOCK FLUSH 100 UNIT/ML IV SOLN
500.0000 [IU] | Freq: Once | INTRAVENOUS | Status: AC | PRN
  Administered 2016-12-21: 500 [IU]
  Filled 2016-12-21: qty 5

## 2016-12-21 MED ORDER — SODIUM CHLORIDE 0.9 % IV SOLN
Freq: Once | INTRAVENOUS | Status: AC
  Administered 2016-12-21: 10:00:00 via INTRAVENOUS

## 2016-12-21 MED ORDER — SODIUM CHLORIDE 0.9 % IV SOLN
Freq: Once | INTRAVENOUS | Status: AC
  Administered 2016-12-21: 11:00:00 via INTRAVENOUS
  Filled 2016-12-21: qty 5

## 2016-12-21 MED ORDER — SODIUM CHLORIDE 0.9 % IV SOLN
175.0000 mg/m2 | Freq: Once | INTRAVENOUS | Status: AC
  Administered 2016-12-21: 330 mg via INTRAVENOUS
  Filled 2016-12-21: qty 55

## 2016-12-21 MED ORDER — DIPHENHYDRAMINE HCL 50 MG/ML IJ SOLN
INTRAMUSCULAR | Status: AC
  Filled 2016-12-21: qty 1

## 2016-12-21 MED ORDER — SODIUM CHLORIDE 0.9 % IV SOLN
733.8000 mg | Freq: Once | INTRAVENOUS | Status: AC
  Administered 2016-12-21: 730 mg via INTRAVENOUS
  Filled 2016-12-21: qty 73

## 2016-12-21 MED ORDER — FAMOTIDINE IN NACL 20-0.9 MG/50ML-% IV SOLN
INTRAVENOUS | Status: AC
  Filled 2016-12-21: qty 50

## 2016-12-21 MED ORDER — PALONOSETRON HCL INJECTION 0.25 MG/5ML
INTRAVENOUS | Status: AC
  Filled 2016-12-21: qty 5

## 2016-12-21 MED ORDER — DIPHENHYDRAMINE HCL 50 MG/ML IJ SOLN
50.0000 mg | Freq: Once | INTRAMUSCULAR | Status: AC
  Administered 2016-12-21: 50 mg via INTRAVENOUS

## 2016-12-21 MED ORDER — FAMOTIDINE IN NACL 20-0.9 MG/50ML-% IV SOLN
20.0000 mg | Freq: Once | INTRAVENOUS | Status: AC
  Administered 2016-12-21: 20 mg via INTRAVENOUS

## 2016-12-21 NOTE — Patient Instructions (Signed)
[name] Discharge Instructions for Patients Receiving Chemotherapy  Today you received the following chemotherapy agents: Taxol, Carboplatin   To help prevent nausea and vomiting after your treatment, we encourage you to take your nausea medication as prescribed. DO NOT TAKE ZOFRAN FOR 3 DAYS FOLLOWING CHEMO.    If you develop nausea and vomiting that is not controlled by your nausea medication, call the clinic.   BELOW ARE SYMPTOMS THAT SHOULD BE REPORTED IMMEDIATELY:  *FEVER GREATER THAN 100.5 F  *CHILLS WITH OR WITHOUT FEVER  NAUSEA AND VOMITING THAT IS NOT CONTROLLED WITH YOUR NAUSEA MEDICATION  *UNUSUAL SHORTNESS OF BREATH  *UNUSUAL BRUISING OR BLEEDING  TENDERNESS IN MOUTH AND THROAT WITH OR WITHOUT PRESENCE OF ULCERS  *URINARY PROBLEMS  *BOWEL PROBLEMS  UNUSUAL RASH Items with * indicate a potential emergency and should be followed up as soon as possible.  Feel free to call the clinic you have any questions or concerns. The clinic phone number is [phone].  Please show the [facility] at check-in to the Emergency Department and triage nurse.  Paclitaxel injection What is this medicine? PACLITAXEL (PAK li TAX el) is a chemotherapy drug. It targets fast dividing cells, like cancer cells, and causes these cells to die. This medicine is used to treat ovarian cancer, breast cancer, and other cancers. This medicine may be used for other purposes; ask your health care provider or pharmacist if you have questions. COMMON BRAND NAME(S): Onxol, Taxol What should I tell my health care provider before I take this medicine? They need to know if you have any of these conditions: -blood disorders -irregular heartbeat -infection (especially a virus infection such as chickenpox, cold sores, or herpes) -liver disease -previous or ongoing radiation therapy -an unusual or allergic reaction to paclitaxel, alcohol, polyoxyethylated castor oil, other  chemotherapy agents, other medicines, foods, dyes, or preservatives -pregnant or trying to get pregnant -breast-feeding How should I use this medicine? This drug is given as an infusion into a vein. It is administered in a hospital or clinic by a specially trained health care professional. Talk to your pediatrician regarding the use of this medicine in children. Special care may be needed. Overdosage: If you think you have taken too much of this medicine contact a poison control center or emergency room at once. NOTE: This medicine is only for you. Do not share this medicine with others. What if I miss a dose? It is important not to miss your dose. Call your doctor or health care professional if you are unable to keep an appointment. What may interact with this medicine? Do not take this medicine with any of the following medications: -disulfiram -metronidazole This medicine may also interact with the following medications: -cyclosporine -diazepam -ketoconazole -medicines to increase blood counts like filgrastim, pegfilgrastim, sargramostim -other chemotherapy drugs like cisplatin, doxorubicin, epirubicin, etoposide, teniposide, vincristine -quinidine -testosterone -vaccines -verapamil Talk to your doctor or health care professional before taking any of these medicines: -acetaminophen -aspirin -ibuprofen -ketoprofen -naproxen This list may not describe all possible interactions. Give your health care provider a list of all the medicines, herbs, non-prescription drugs, or dietary supplements you use. Also tell them if you smoke, drink alcohol, or use illegal drugs. Some items may interact with your medicine. What should I watch for while using this medicine? Your condition will be monitored carefully while you are receiving this medicine. You will need important blood work done while you are taking this medicine. This medicine can cause serious  To reduce your risk  you will need to take other medicine(s) before treatment with this medicine. If you experience allergic reactions like skin rash, itching or hives, swelling of the face, lips, or tongue, tell your doctor or health care professional right away. In some cases, you may be given additional medicines to help with side effects. Follow all directions for their use. This drug may make you feel generally unwell. This is not uncommon, as chemotherapy can affect healthy cells as well as cancer cells. Report any side effects. Continue your course of treatment even though you feel ill unless your doctor tells you to stop. Call your doctor or health care professional for advice if you get a fever, chills or sore throat, or other symptoms of a cold or flu. Do not treat yourself. This drug decreases your body's ability to fight infections. Try to avoid being around people who are sick. This medicine may increase your risk to bruise or bleed. Call your doctor or health care professional if you notice any unusual bleeding. Be careful brushing and flossing your teeth or using a toothpick because you may get an infection or bleed more easily. If you have any dental work done, tell your dentist you are receiving this medicine. Avoid taking products that contain aspirin, acetaminophen, ibuprofen, naproxen, or ketoprofen unless instructed by your doctor. These medicines may hide a fever. Do not become pregnant while taking this medicine. Women should inform their doctor if they wish to become pregnant or think they might be pregnant. There is a potential for serious side effects to an unborn child. Talk to your health care professional or pharmacist for more information. Do not breast-feed an infant while taking this medicine. Men are advised not to father a child while receiving this medicine. This product may contain alcohol. Ask your pharmacist or healthcare provider if this medicine contains alcohol. Be sure to tell all  healthcare providers you are taking this medicine. Certain medicines, like metronidazole and disulfiram, can cause an unpleasant reaction when taken with alcohol. The reaction includes flushing, headache, nausea, vomiting, sweating, and increased thirst. The reaction can last from 30 minutes to several hours. What side effects may I notice from receiving this medicine? Side effects that you should report to your doctor or health care professional as soon as possible: -allergic reactions like skin rash, itching or hives, swelling of the face, lips, or tongue -low blood counts - This drug may decrease the number of white blood cells, red blood cells and platelets. You may be at increased risk for infections and bleeding. -signs of infection - fever or chills, cough, sore throat, pain or difficulty passing urine -signs of decreased platelets or bleeding - bruising, pinpoint red spots on the skin, black, tarry stools, nosebleeds -signs of decreased red blood cells - unusually weak or tired, fainting spells, lightheadedness -breathing problems -chest pain -high or low blood pressure -mouth sores -nausea and vomiting -pain, swelling, redness or irritation at the injection site -pain, tingling, numbness in the hands or feet -slow or irregular heartbeat -swelling of the ankle, feet, hands Side effects that usually do not require medical attention (report to your doctor or health care professional if they continue or are bothersome): -bone pain -complete hair loss including hair on your head, underarms, pubic hair, eyebrows, and eyelashes -changes in the color of fingernails -diarrhea -loosening of the fingernails -loss of appetite -muscle or joint pain -red flush to skin -sweating This list may not describe all possible side   possible side effects. Call your doctor for medical advice about side effects. You may report side effects to FDA at 1-800-FDA-1088. Where should I keep my medicine? This drug is given in  a hospital or clinic and will not be stored at home. NOTE: This sheet is a summary. It may not cover all possible information. If you have questions about this medicine, talk to your doctor, pharmacist, or health care provider.  2018 Elsevier/Gold Standard (2015-06-03 19:58:00)  Carboplatin injection What is this medicine? CARBOPLATIN (KAR boe pla tin) is a chemotherapy drug. It targets fast dividing cells, like cancer cells, and causes these cells to die. This medicine is used to treat ovarian cancer and many other cancers. This medicine may be used for other purposes; ask your health care provider or pharmacist if you have questions. COMMON BRAND NAME(S): Paraplatin What should I tell my health care provider before I take this medicine? They need to know if you have any of these conditions: -blood disorders -hearing problems -kidney disease -recent or ongoing radiation therapy -an unusual or allergic reaction to carboplatin, cisplatin, other chemotherapy, other medicines, foods, dyes, or preservatives -pregnant or trying to get pregnant -breast-feeding How should I use this medicine? This drug is usually given as an infusion into a vein. It is administered in a hospital or clinic by a specially trained health care professional. Talk to your pediatrician regarding the use of this medicine in children. Special care may be needed. Overdosage: If you think you have taken too much of this medicine contact a poison control center or emergency room at once. NOTE: This medicine is only for you. Do not share this medicine with others. What if I miss a dose? It is important not to miss a dose. Call your doctor or health care professional if you are unable to keep an appointment. What may interact with this medicine? -medicines for seizures -medicines to increase blood counts like filgrastim, pegfilgrastim, sargramostim -some antibiotics like amikacin, gentamicin, neomycin, streptomycin,  tobramycin -vaccines Talk to your doctor or health care professional before taking any of these medicines: -acetaminophen -aspirin -ibuprofen -ketoprofen -naproxen This list may not describe all possible interactions. Give your health care provider a list of all the medicines, herbs, non-prescription drugs, or dietary supplements you use. Also tell them if you smoke, drink alcohol, or use illegal drugs. Some items may interact with your medicine. What should I watch for while using this medicine? Your condition will be monitored carefully while you are receiving this medicine. You will need important blood work done while you are taking this medicine. This drug may make you feel generally unwell. This is not uncommon, as chemotherapy can affect healthy cells as well as cancer cells. Report any side effects. Continue your course of treatment even though you feel ill unless your doctor tells you to stop. In some cases, you may be given additional medicines to help with side effects. Follow all directions for their use. Call your doctor or health care professional for advice if you get a fever, chills or sore throat, or other symptoms of a cold or flu. Do not treat yourself. This drug decreases your body's ability to fight infections. Try to avoid being around people who are sick. This medicine may increase your risk to bruise or bleed. Call your doctor or health care professional if you notice any unusual bleeding. Be careful brushing and flossing your teeth or using a toothpick because you may get an infection or bleed more easily. If  you have any dental work done, tell your dentist you are receiving this medicine. Avoid taking products that contain aspirin, acetaminophen, ibuprofen, naproxen, or ketoprofen unless instructed by your doctor. These medicines may hide a fever. Do not become pregnant while taking this medicine. Women should inform their doctor if they wish to become pregnant or think  they might be pregnant. There is a potential for serious side effects to an unborn child. Talk to your health care professional or pharmacist for more information. Do not breast-feed an infant while taking this medicine. What side effects may I notice from receiving this medicine? Side effects that you should report to your doctor or health care professional as soon as possible: -allergic reactions like skin rash, itching or hives, swelling of the face, lips, or tongue -signs of infection - fever or chills, cough, sore throat, pain or difficulty passing urine -signs of decreased platelets or bleeding - bruising, pinpoint red spots on the skin, black, tarry stools, nosebleeds -signs of decreased red blood cells - unusually weak or tired, fainting spells, lightheadedness -breathing problems -changes in hearing -changes in vision -chest pain -high blood pressure -low blood counts - This drug may decrease the number of white blood cells, red blood cells and platelets. You may be at increased risk for infections and bleeding. -nausea and vomiting -pain, swelling, redness or irritation at the injection site -pain, tingling, numbness in the hands or feet -problems with balance, talking, walking -trouble passing urine or change in the amount of urine Side effects that usually do not require medical attention (report to your doctor or health care professional if they continue or are bothersome): -hair loss -loss of appetite -metallic taste in the mouth or changes in taste This list may not describe all possible side effects. Call your doctor for medical advice about side effects. You may report side effects to FDA at 1-800-FDA-1088. Where should I keep my medicine? This drug is given in a hospital or clinic and will not be stored at home. NOTE: This sheet is a summary. It may not cover all possible information. If you have questions about this medicine, talk to your doctor, pharmacist, or health care  provider.  2018 Elsevier/Gold Standard (2007-11-07 14:38:05)

## 2016-12-21 NOTE — Progress Notes (Signed)
Per MD OK to use labs from 12/15/16  Wylene Simmer, BSN, RN 12/21/2016 10:02 AM

## 2016-12-22 ENCOUNTER — Telehealth: Payer: Self-pay

## 2016-12-22 NOTE — Telephone Encounter (Signed)
Left message asking patient to call us if she has any question s or concerns after first chemo yesterday.

## 2016-12-22 NOTE — Telephone Encounter (Signed)
-----   Message from Sherril Croon, RN sent at 12/21/2016  4:52 PM EDT ----- Regarding: Chemo Follow-up  Contact: [phone] 1st time Taxol, Carboplatin

## 2016-12-23 ENCOUNTER — Other Ambulatory Visit: Payer: Self-pay | Admitting: *Deleted

## 2016-12-23 ENCOUNTER — Ambulatory Visit (HOSPITAL_BASED_OUTPATIENT_CLINIC_OR_DEPARTMENT_OTHER): Payer: BLUE CROSS/BLUE SHIELD

## 2016-12-23 ENCOUNTER — Telehealth: Payer: Self-pay | Admitting: Family

## 2016-12-23 ENCOUNTER — Telehealth: Payer: Self-pay | Admitting: *Deleted

## 2016-12-23 VITALS — BP 139/73 | HR 77 | Temp 97.7°F | Resp 18

## 2016-12-23 DIAGNOSIS — C541 Malignant neoplasm of endometrium: Secondary | ICD-10-CM | POA: Diagnosis not present

## 2016-12-23 DIAGNOSIS — Z5189 Encounter for other specified aftercare: Secondary | ICD-10-CM | POA: Diagnosis not present

## 2016-12-23 MED ORDER — PEGFILGRASTIM INJECTION 6 MG/0.6ML ~~LOC~~
6.0000 mg | PREFILLED_SYRINGE | Freq: Once | SUBCUTANEOUS | Status: AC
  Administered 2016-12-23: 6 mg via SUBCUTANEOUS
  Filled 2016-12-23: qty 0.6

## 2016-12-23 MED ORDER — METFORMIN HCL ER 500 MG PO TB24: 500.0000 mg | ORAL_TABLET | Freq: Every day | ORAL | 2 refills | Status: DC

## 2016-12-23 NOTE — Telephone Encounter (Signed)
"  I take metformin.  It's run out.  I need a pill for today and need Dr.Gorsuch to send this to [facility] in [place]."  This medicine prescribed by [facility] per EPIC.  Encouraged patient to call this office with this request.  No further questions.

## 2016-12-23 NOTE — Telephone Encounter (Signed)
done

## 2016-12-23 NOTE — Progress Notes (Signed)
Metformin sent in per pt request Per office protocol

## 2016-12-23 NOTE — Patient Instructions (Signed)
Pegfilgrastim injection What is this medicine? PEGFILGRASTIM (PEG fil gra stim) is a long-acting granulocyte colony-stimulating factor that stimulates the growth of neutrophils, a type of white blood cell important in the body's fight against infection. It is used to reduce the incidence of fever and infection in patients with certain types of cancer who are receiving chemotherapy that affects the bone marrow, and to increase survival after being exposed to high doses of radiation. This medicine may be used for other purposes; ask your health care provider or pharmacist if you have questions. COMMON BRAND NAME(S): Neulasta What should I tell my health care provider before I take this medicine? They need to know if you have any of these conditions: -kidney disease -latex allergy -ongoing radiation therapy -sickle cell disease -skin reactions to acrylic adhesives (On-Body Injector only) -an unusual or allergic reaction to pegfilgrastim, filgrastim, other medicines, foods, dyes, or preservatives -pregnant or trying to get pregnant -breast-feeding How should I use this medicine? This medicine is for injection under the skin. If you get this medicine at home, you will be taught how to prepare and give the pre-filled syringe or how to use the On-body Injector. Refer to the patient Instructions for Use for detailed instructions. Use exactly as directed. Tell your healthcare provider immediately if you suspect that the On-body Injector may not have performed as intended or if you suspect the use of the On-body Injector resulted in a missed or partial dose. It is important that you put your used needles and syringes in a special sharps container. Do not put them in a trash can. If you do not have a sharps container, call your pharmacist or healthcare provider to get one. Talk to your pediatrician regarding the use of this medicine in children. While this drug may be prescribed for selected conditions,  precautions do apply. Overdosage: If you think you have taken too much of this medicine contact a poison control center or emergency room at once. NOTE: This medicine is only for you. Do not share this medicine with others. What if I miss a dose? It is important not to miss your dose. Call your doctor or health care professional if you miss your dose. If you miss a dose due to an On-body Injector failure or leakage, a new dose should be administered as soon as possible using a single prefilled syringe for manual use. What may interact with this medicine? Interactions have not been studied. Give your health care provider a list of all the medicines, herbs, non-prescription drugs, or dietary supplements you use. Also tell them if you smoke, drink alcohol, or use illegal drugs. Some items may interact with your medicine. This list may not describe all possible interactions. Give your health care provider a list of all the medicines, herbs, non-prescription drugs, or dietary supplements you use. Also tell them if you smoke, drink alcohol, or use illegal drugs. Some items may interact with your medicine. What should I watch for while using this medicine? You may need blood work done while you are taking this medicine. If you are going to need a MRI, CT scan, or other procedure, tell your doctor that you are using this medicine (On-Body Injector only). What side effects may I notice from receiving this medicine? Side effects that you should report to your doctor or health care professional as soon as possible: -allergic reactions like skin rash, itching or hives, swelling of the face, lips, or tongue -dizziness -fever -pain, redness, or irritation at site   where injected -pinpoint red spots on the skin -red or dark-brown urine -shortness of breath or breathing problems -stomach or side pain, or pain at the shoulder -swelling -tiredness -trouble passing urine or change in the amount of urine Side  effects that usually do not require medical attention (report to your doctor or health care professional if they continue or are bothersome): -bone pain -muscle pain This list may not describe all possible side effects. Call your doctor for medical advice about side effects. You may report side effects to FDA at 1-800-FDA-1088. Where should I keep my medicine? Keep out of the reach of children. Store pre-filled syringes in a refrigerator between 2 and 8 degrees C (36 and 46 degrees F). Do not freeze. Keep in carton to protect from light. Throw away this medicine if it is left out of the refrigerator for more than 48 hours. Throw away any unused medicine after the expiration date. NOTE: This sheet is a summary. It may not cover all possible information. If you have questions about this medicine, talk to your doctor, pharmacist, or health care provider.  2018 Elsevier/Gold Standard (2016-07-29 12:58:03)  

## 2016-12-23 NOTE — Telephone Encounter (Signed)
What is the name of the medication? Metformin. She needs this today please.  Have you contacted your pharmacy to request a refill? Yes, she is taking chemo and can't come in today.   Which pharmacy would you like this sent to? [facility] in [place].    Patient notified that their request is being sent to the clinical staff for review and that they should receive a call once it is complete. If they do not receive a call within 24 hours they can check with their pharmacy or our office.

## 2017-01-11 ENCOUNTER — Other Ambulatory Visit: Payer: BLUE CROSS/BLUE SHIELD

## 2017-01-11 ENCOUNTER — Telehealth: Payer: Self-pay

## 2017-01-11 ENCOUNTER — Ambulatory Visit: Payer: BLUE CROSS/BLUE SHIELD | Admitting: Hematology and Oncology

## 2017-01-11 NOTE — Telephone Encounter (Signed)
Triage nurse s/w pt and answered her questions about using emla cream for port and the 2 steri-strips remaining on port.

## 2017-01-11 NOTE — Telephone Encounter (Signed)
Patient left message to call her. Called patient back and left message to call me back.

## 2017-01-12 ENCOUNTER — Encounter: Payer: Self-pay | Admitting: Hematology and Oncology

## 2017-01-12 ENCOUNTER — Other Ambulatory Visit (HOSPITAL_BASED_OUTPATIENT_CLINIC_OR_DEPARTMENT_OTHER): Payer: BLUE CROSS/BLUE SHIELD

## 2017-01-12 ENCOUNTER — Ambulatory Visit: Payer: BLUE CROSS/BLUE SHIELD

## 2017-01-12 ENCOUNTER — Ambulatory Visit (HOSPITAL_BASED_OUTPATIENT_CLINIC_OR_DEPARTMENT_OTHER): Payer: BLUE CROSS/BLUE SHIELD | Admitting: Hematology and Oncology

## 2017-01-12 ENCOUNTER — Other Ambulatory Visit: Payer: Self-pay | Admitting: Hematology and Oncology

## 2017-01-12 ENCOUNTER — Ambulatory Visit (HOSPITAL_BASED_OUTPATIENT_CLINIC_OR_DEPARTMENT_OTHER): Payer: BLUE CROSS/BLUE SHIELD

## 2017-01-12 VITALS — HR 103

## 2017-01-12 DIAGNOSIS — E1100 Type 2 diabetes mellitus with hyperosmolarity without nonketotic hyperglycemic-hyperosmolar coma (NKHHC): Secondary | ICD-10-CM

## 2017-01-12 DIAGNOSIS — C7982 Secondary malignant neoplasm of genital organs: Secondary | ICD-10-CM

## 2017-01-12 DIAGNOSIS — C541 Malignant neoplasm of endometrium: Secondary | ICD-10-CM

## 2017-01-12 DIAGNOSIS — R110 Nausea: Secondary | ICD-10-CM | POA: Diagnosis not present

## 2017-01-12 DIAGNOSIS — Z95828 Presence of other vascular implants and grafts: Secondary | ICD-10-CM

## 2017-01-12 DIAGNOSIS — C7961 Secondary malignant neoplasm of right ovary: Secondary | ICD-10-CM

## 2017-01-12 DIAGNOSIS — N951 Menopausal and female climacteric states: Secondary | ICD-10-CM | POA: Insufficient documentation

## 2017-01-12 DIAGNOSIS — Z5111 Encounter for antineoplastic chemotherapy: Secondary | ICD-10-CM

## 2017-01-12 DIAGNOSIS — E119 Type 2 diabetes mellitus without complications: Secondary | ICD-10-CM | POA: Diagnosis not present

## 2017-01-12 DIAGNOSIS — Z720 Tobacco use: Secondary | ICD-10-CM | POA: Diagnosis not present

## 2017-01-12 DIAGNOSIS — E559 Vitamin D deficiency, unspecified: Secondary | ICD-10-CM

## 2017-01-12 DIAGNOSIS — F19982 Other psychoactive substance use, unspecified with psychoactive substance-induced sleep disorder: Secondary | ICD-10-CM | POA: Insufficient documentation

## 2017-01-12 DIAGNOSIS — T451X5A Adverse effect of antineoplastic and immunosuppressive drugs, initial encounter: Secondary | ICD-10-CM | POA: Insufficient documentation

## 2017-01-12 LAB — COMPREHENSIVE METABOLIC PANEL
ALT: 49 U/L (ref 0–55)
ANION GAP: 14 meq/L — AB (ref 3–11)
AST: 23 U/L (ref 5–34)
Albumin: 4.1 g/dL (ref 3.5–5.0)
Alkaline Phosphatase: 156 U/L — ABNORMAL HIGH (ref 40–150)
BUN: 14.3 mg/dL (ref 7.0–26.0)
CHLORIDE: 101 meq/L (ref 98–109)
CO2: 20 meq/L — AB (ref 22–29)
Calcium: 9.9 mg/dL (ref 8.4–10.4)
Creatinine: 1 mg/dL (ref 0.6–1.1)
EGFR: 62 mL/min/{1.73_m2} — AB (ref >90)
Glucose: 542 mg/dl — ABNORMAL HIGH (ref 70–140)
Potassium: 4.6 mEq/L (ref 3.5–5.1)
Sodium: 134 mEq/L — ABNORMAL LOW (ref 136–145)
Total Bilirubin: 0.71 mg/dL (ref 0.20–1.20)
Total Protein: 7.4 g/dL (ref 6.4–8.3)

## 2017-01-12 LAB — CBC WITH DIFFERENTIAL/PLATELET
BASO%: 0 % (ref 0.0–2.0)
Basophils Absolute: 0 10*3/uL (ref 0.0–0.1)
EOS%: 0 % (ref 0.0–7.0)
Eosinophils Absolute: 0 10*3/uL (ref 0.0–0.5)
HCT: 40.4 % (ref 34.8–46.6)
HGB: 13.8 g/dL (ref 11.6–15.9)
LYMPH%: 5.3 % — AB (ref 14.0–49.7)
MCH: 30.2 pg (ref 25.1–34.0)
MCHC: 34.2 g/dL (ref 31.5–36.0)
MCV: 88.4 fL (ref 79.5–101.0)
MONO#: 0.1 10*3/uL (ref 0.1–0.9)
MONO%: 0.5 % (ref 0.0–14.0)
NEUT#: 12.8 10*3/uL — ABNORMAL HIGH (ref 1.5–6.5)
NEUT%: 94.2 % — AB (ref 38.4–76.8)
Platelets: 305 10*3/uL (ref 145–400)
RBC: 4.57 10*6/uL (ref 3.70–5.45)
RDW: 13.9 % (ref 11.2–14.5)
WBC: 13.6 10*3/uL — ABNORMAL HIGH (ref 3.9–10.3)
lymph#: 0.7 10*3/uL — ABNORMAL LOW (ref 0.9–3.3)

## 2017-01-12 MED ORDER — PALONOSETRON HCL INJECTION 0.25 MG/5ML
INTRAVENOUS | Status: AC
  Filled 2017-01-12: qty 5

## 2017-01-12 MED ORDER — FAMOTIDINE IN NACL 20-0.9 MG/50ML-% IV SOLN
INTRAVENOUS | Status: AC
  Filled 2017-01-12: qty 50

## 2017-01-12 MED ORDER — DIPHENHYDRAMINE HCL 50 MG/ML IJ SOLN
INTRAMUSCULAR | Status: AC
  Filled 2017-01-12: qty 1

## 2017-01-12 MED ORDER — INSULIN REGULAR HUMAN 100 UNIT/ML IJ SOLN
12.0000 [IU] | Freq: Once | INTRAMUSCULAR | Status: AC
  Administered 2017-01-12: 12 [IU] via SUBCUTANEOUS
  Filled 2017-01-12: qty 0.12

## 2017-01-12 MED ORDER — SODIUM CHLORIDE 0.9% FLUSH
10.0000 mL | Freq: Once | INTRAVENOUS | Status: AC
  Administered 2017-01-12: 10 mL
  Filled 2017-01-12: qty 10

## 2017-01-12 MED ORDER — SODIUM CHLORIDE 0.9 % IV SOLN
Freq: Once | INTRAVENOUS | Status: AC
  Administered 2017-01-12: 10:00:00 via INTRAVENOUS

## 2017-01-12 MED ORDER — SODIUM CHLORIDE 0.9% FLUSH
10.0000 mL | INTRAVENOUS | Status: DC | PRN
  Administered 2017-01-12: 10 mL
  Filled 2017-01-12: qty 10

## 2017-01-12 MED ORDER — FOSAPREPITANT DIMEGLUMINE INJECTION 150 MG
Freq: Once | INTRAVENOUS | Status: AC
  Administered 2017-01-12: 11:00:00 via INTRAVENOUS
  Filled 2017-01-12: qty 5

## 2017-01-12 MED ORDER — FAMOTIDINE IN NACL 20-0.9 MG/50ML-% IV SOLN
20.0000 mg | Freq: Once | INTRAVENOUS | Status: AC
  Administered 2017-01-12: 20 mg via INTRAVENOUS

## 2017-01-12 MED ORDER — SODIUM CHLORIDE 0.9 % IV SOLN
617.4000 mg | Freq: Once | INTRAVENOUS | Status: AC
  Administered 2017-01-12: 620 mg via INTRAVENOUS
  Filled 2017-01-12: qty 62

## 2017-01-12 MED ORDER — PACLITAXEL CHEMO INJECTION 300 MG/50ML
175.0000 mg/m2 | Freq: Once | INTRAVENOUS | Status: AC
  Administered 2017-01-12: 330 mg via INTRAVENOUS
  Filled 2017-01-12: qty 55

## 2017-01-12 MED ORDER — HEPARIN SOD (PORK) LOCK FLUSH 100 UNIT/ML IV SOLN
500.0000 [IU] | Freq: Once | INTRAVENOUS | Status: AC | PRN
  Administered 2017-01-12: 500 [IU]
  Filled 2017-01-12: qty 5

## 2017-01-12 MED ORDER — FLUCONAZOLE 150 MG PO TABS: 150.0000 mg | ORAL_TABLET | Freq: Once | ORAL | 0 refills | Status: AC

## 2017-01-12 MED ORDER — PALONOSETRON HCL INJECTION 0.25 MG/5ML
0.2500 mg | Freq: Once | INTRAVENOUS | Status: AC
  Administered 2017-01-12: 0.25 mg via INTRAVENOUS

## 2017-01-12 MED ORDER — DIPHENHYDRAMINE HCL 50 MG/ML IJ SOLN
50.0000 mg | Freq: Once | INTRAMUSCULAR | Status: AC
  Administered 2017-01-12: 50 mg via INTRAVENOUS

## 2017-01-12 NOTE — Progress Notes (Signed)
Dr. Alvy Bimler okay to tx today with HR 103 and CBG 542. 12U subq insulin ordered. Communicated to pt per Dr. Alvy Bimler to only take 3 tablets dexamethasone in the evening and morning prior to treatment instead of 5.

## 2017-01-12 NOTE — Patient Instructions (Signed)
[facility] Discharge Instructions for Patients Receiving Chemotherapy  Today you received the following chemotherapy agents: paclitaxel (Taxol) and carboplatin (Paraplatin).   To help prevent nausea and vomiting after your treatment, we encourage you to take your nausea medication as prescribed. DO NOT TAKE ZOFRAN FOR 3 DAYS FOLLOWING CHEMO.    If you develop nausea and vomiting that is not controlled by your nausea medication, call the clinic.   BELOW ARE SYMPTOMS THAT SHOULD BE REPORTED IMMEDIATELY:  *FEVER GREATER THAN 100.5 F  *CHILLS WITH OR WITHOUT FEVER  NAUSEA AND VOMITING THAT IS NOT CONTROLLED WITH YOUR NAUSEA MEDICATION  *UNUSUAL SHORTNESS OF BREATH  *UNUSUAL BRUISING OR BLEEDING  TENDERNESS IN MOUTH AND THROAT WITH OR WITHOUT PRESENCE OF ULCERS  *URINARY PROBLEMS  *BOWEL PROBLEMS  UNUSUAL RASH Items with * indicate a potential emergency and should be followed up as soon as possible.  Feel free to call the clinic you have any questions or concerns. The clinic phone number is [phone].  Please show the [facility] at check-in to the Emergency Department and triage nurse.

## 2017-01-12 NOTE — Assessment & Plan Note (Signed)
She is instructed to take antiemetics on a scheduled basis on day 3 after each cycle of treatment

## 2017-01-12 NOTE — Assessment & Plan Note (Signed)
I spent some time counseling the patient the importance of tobacco cessation. We discussed common strategies including nicotine patches, Tobacco Quit-line, and other nicotine replacement products to assist in hereffort to quit  she appears motivated to quit. I gave her prescription of nicotine patch to try She is attempting to quit

## 2017-01-12 NOTE — Assessment & Plan Note (Signed)
She tolerated chemotherapy fairly well except for expected side effects from treatment which is manageable We will continue cycle 2 of treatment without dose adjustment

## 2017-01-12 NOTE — Assessment & Plan Note (Signed)
She has severe hyperglycemia due to steroid premedications I plan to reduce future premedication to 12 mg at midnight and 12 mg at 6:00 in the morning, on the days before each treatment Today, I will give her some insulin She will continue metformin as instructed

## 2017-01-12 NOTE — Assessment & Plan Note (Signed)
This is related to steroid treatment Hopefully, with dose adjustment, insomnia might go away For now, continue conservative management only

## 2017-01-12 NOTE — Assessment & Plan Note (Signed)
She had recent frequent hot flashes likely due to menopausal symptoms It is not debilitating We will monitor that for now

## 2017-01-12 NOTE — Progress Notes (Signed)
[place] OFFICE PROGRESS NOTE  Patient Care Team: [name], FNP as PCP - General (Nurse Practitioner)  SUMMARY OF ONCOLOGIC HISTORY:   Endometrial cancer ([place])   09/22/2016 Initial Diagnosis    She presented to her physician with postmenopausal bleeding      10/13/2016 Imaging    The patient reported post-menopausal bleeding to her physician who performed a TVUS on 10/13/16. This showed a uterus measuring 7.6x4.6x 6.26cm. The endometrial lining measuringed 2.24cm. The ovaries were normal. A 4cm intracavitary mass was appreciated on [place].       10/13/2016 Pathology Results    The patient then underwent endometrial pipelle biopsy on 10/13/16. It revealed an endometrioid adenocarcinoma, (FIGO grade 1).       11/16/2016 Pathology Results    1. Lymph node, sentinel, biopsy, right obturator - TWO BENIGN LYMPH NODES (0/2). 2. Lymph node, sentinel, biopsy, right external iliac - ONE BENIGN LYMPH NODE (0/1). 3. Lymph node, sentinel, biopsy, left obturator - ONE BENIGN LYMPH NODE (0/1). 4. Uterus +/- tubes/ovaries, neoplastic - ENDOMETRIOID ADENOCARCINOMA, 5.2 CM. - CARCINOMA FOCALLY INVOLVES DEEP MYOMETRIUM. - RIGHT FALLOPIAN TUBE AND RIGHT OVARY INVOLVED BY ENDOMETRIOID CARCINOMA. - CERVIX, LEFT FALLOPIAN, AND LEFT OVARY FREE OF TUMOR. - SEE ONCOLOGY TABLE AND COMMENT.      11/16/2016 Surgery    Operation: Robotic-assisted laparoscopic total hysterectomy with bilateral salpingoophorectomy with bilateral SLN biopsy  Surgeon: Donaciano Eva  Operative Findings:  : 7cm normal appearing uterus, normal appearing tubes and ovaries, no suspicious nodes.      11/29/2016 Imaging    1. Interval hysterectomy.  No evidence of metastatic disease. 2. Postsurgical changes in the pelvis with minimal free fluid and soft tissue edema. 3. Stable colon diverticular changes without acute inflammation. 4. Suspected small incidental lesions within the liver and spleen. 5.  Stable sclerotic lesion in the left ischium, likely benign      12/15/2016 Procedure    Successful placement of a right internal jugular approach power injectable Port-A-Cath. The catheter is ready for immediate use      12/22/2016 -  Chemotherapy    She received carboplatin and Taxol       INTERVAL HISTORY: Please see below for problem oriented charting. She returns with her husband prior to cycle 2 of chemo She complained of insomnia, significant hyperglycemia due to steroids, recent chemotherapy induced nausea and fatigue She also have frequent night hot flashes. She denies mouth sores, vomiting or constipation She is attempting to quit smoking She denies peripheral neuropathy  REVIEW OF SYSTEMS:   Constitutional: Denies fevers, chills or abnormal weight loss Eyes: Denies blurriness of vision Ears, nose, mouth, throat, and face: Denies mucositis or sore throat Respiratory: Denies cough, dyspnea or wheezes Cardiovascular: Denies palpitation, chest discomfort or lower extremity swelling Skin: Denies abnormal skin rashes Lymphatics: Denies new lymphadenopathy or easy bruising Neurological:Denies numbness, tingling or new weaknesses Behavioral/Psych: Mood is stable, no new changes  All other systems were reviewed with the patient and are negative.  I have reviewed the past medical history, past surgical history, social history and family history with the patient and they are unchanged from previous note.  ALLERGIES:  has No Known Allergies.  MEDICATIONS:  Current Outpatient Prescriptions  Medication Sig Dispense Refill  . cyclobenzaprine (FLEXERIL) 10 MG tablet Take 10 mg by mouth as needed for muscle spasms.    Marland Kitchen dexamethasone (DECADRON) 4 MG tablet Take 5 tablets at midnight and 5 tablets at 6 am on the  day of chemotherapy, every 3 weeks 30 tablet 1  . escitalopram (LEXAPRO) 10 MG tablet Take 1 tablet (10 mg total) by mouth daily. 90 tablet 3  . ibuprofen (IBU) 600 MG tablet  Take 1 tablet (600 mg total) by mouth every 6 (six) hours as needed. 30 tablet 0  . lidocaine-prilocaine (EMLA) cream Apply to affected area once 30 g 3  . loratadine (CLARITIN) 10 MG tablet Take 10 mg by mouth daily.    . metFORMIN (GLUCOPHAGE-XR) 500 MG 24 hr tablet Take 1 tablet (500 mg total) by mouth daily. 30 tablet 2  . ondansetron (ZOFRAN) 8 MG tablet Take 1 tablet (8 mg total) by mouth 2 (two) times daily as needed for refractory nausea / vomiting. Start on day 3 after chemo. 30 tablet 1  . prochlorperazine (COMPAZINE) 10 MG tablet Take 1 tablet (10 mg total) by mouth every 6 (six) hours as needed (Nausea or vomiting). 30 tablet 1  . fluconazole (DIFLUCAN) 150 MG tablet Take 1 tablet (150 mg total) by mouth once. 1 tablet 0  . nicotine (NICODERM CQ - DOSED IN MG/24 HOURS) 21 mg/24hr patch Place 1 patch (21 mg total) onto the skin daily. (Patient not taking: Reported on 12/09/2016) 28 patch 0  . oxyCODONE-acetaminophen (PERCOCET/ROXICET) 5-325 MG tablet Take 1-2 tablets by mouth every 4 (four) hours as needed (moderate to severe pain (when tolerating fluids)). (Patient not taking: Reported on 12/09/2016) 30 tablet 0  . senna (SENOKOT) 8.6 MG TABS tablet Take 1 tablet (8.6 mg total) by mouth at bedtime. (Patient not taking: Reported on 12/09/2016) 120 each 0  . Vitamin D, Ergocalciferol, (DRISDOL) 50000 units CAPS capsule Take 1 capsule (50,000 Units total) by mouth once a week. (Patient not taking: Reported on 12/09/2016) 12 capsule 1   No current facility-administered medications for this visit.    Facility-Administered Medications Ordered in Other Visits  Medication Dose Route Frequency Provider Last Rate Last Dose  . CARBOplatin (PARAPLATIN) 620 mg in sodium chloride 0.9 % 250 mL chemo infusion  620 mg Intravenous Once Alvy Bimler, Zayan Delvecchio, MD      . heparin lock flush 100 unit/mL  500 Units Intracatheter Once PRN Alvy Bimler, Tinzlee Craker, MD      . PACLitaxel (TAXOL) 330 mg in sodium chloride 0.9 % 500 mL chemo  infusion (> 74m/m2)  175 mg/m2 (Treatment Plan Recorded) Intravenous Once GHeath Lark MD 185 mL/hr at 01/12/17 1148 330 mg at 01/12/17 1148  . sodium chloride flush (NS) 0.9 % injection 10 mL  10 mL Intracatheter PRN GAlvy Bimler Tola Meas, MD        PHYSICAL EXAMINATION: ECOG PERFORMANCE STATUS: 1 - Symptomatic but completely ambulatory  Vitals:   01/12/17 0951  BP: 135/81  Pulse: (!) 108  Resp: 20  Temp: 98.2 F (36.8 C)   Filed Weights   01/12/17 0951  Weight: 160 lb 14.4 oz (73 kg)    GENERAL:alert, no distress and comfortable SKIN: skin color, texture, turgor are normal, no rashes or significant lesions EYES: normal, Conjunctiva are pink and non-injected, sclera clear OROPHARYNX:no exudate, no erythema and lips, buccal mucosa, and tongue normal  NECK: supple, thyroid normal size, non-tender, without nodularity LYMPH:  no palpable lymphadenopathy in the cervical, axillary or inguinal LUNGS: clear to auscultation and percussion with normal breathing effort HEART: regular rate & rhythm and no murmurs and no lower extremity edema ABDOMEN:abdomen soft, non-tender and normal bowel sounds Musculoskeletal:no cyanosis of digits and no clubbing  NEURO: alert & oriented x 3 with  fluent speech, no focal motor/sensory deficits  LABORATORY DATA:  I have reviewed the data as listed    Component Value Date/Time   NA 134 (L) 01/12/2017 0932   K 4.6 01/12/2017 0932   CL 105 11/17/2016 0451   CO2 20 (L) 01/12/2017 0932   GLUCOSE 542 (H) 01/12/2017 0932   BUN 14.3 01/12/2017 0932   CREATININE 1.0 01/12/2017 0932   CALCIUM 9.9 01/12/2017 0932   PROT 7.4 01/12/2017 0932   ALBUMIN 4.1 01/12/2017 0932   AST 23 01/12/2017 0932   ALT 49 01/12/2017 0932   ALKPHOS 156 (H) 01/12/2017 0932   BILITOT 0.71 01/12/2017 0932   GFRNONAA >60 11/17/2016 0451   GFRAA >60 11/17/2016 0451    No results found for: SPEP, UPEP  Lab Results  Component Value Date   WBC 13.6 (H) 01/12/2017   NEUTROABS 12.8  (H) 01/12/2017   HGB 13.8 01/12/2017   HCT 40.4 01/12/2017   MCV 88.4 01/12/2017   PLT 305 01/12/2017      Chemistry      Component Value Date/Time   NA 134 (L) 01/12/2017 0932   K 4.6 01/12/2017 0932   CL 105 11/17/2016 0451   CO2 20 (L) 01/12/2017 0932   BUN 14.3 01/12/2017 0932   CREATININE 1.0 01/12/2017 0932      Component Value Date/Time   CALCIUM 9.9 01/12/2017 0932   ALKPHOS 156 (H) 01/12/2017 0932   AST 23 01/12/2017 0932   ALT 49 01/12/2017 0932   BILITOT 0.71 01/12/2017 0932       RADIOGRAPHIC STUDIES: I have personally reviewed the radiological images as listed and agreed with the findings in the report. Ir US Guide Vasc Access Right  Result Date: 12/15/2016 INDICATION: History of endometrial cancer. In need of durable intravenous access for chemotherapy administration. EXAM: IMPLANTED PORT A CATH PLACEMENT WITH ULTRASOUND AND FLUOROSCOPIC GUIDANCE COMPARISON:  CT the chest, abdomen and pelvis - 11/29/2016 MEDICATIONS: Ancef 2 gm IV; The antibiotic was administered within an appropriate time interval prior to skin puncture. ANESTHESIA/SEDATION: Moderate (conscious) sedation was employed during this procedure. A total of Versed 4 mg and Fentanyl 200 mcg was administered intravenously. Moderate Sedation Time: 28 minutes. The patient's level of consciousness and vital signs were monitored continuously by radiology nursing throughout the procedure under my direct supervision. CONTRAST:  None FLUOROSCOPY TIME:  24 seconds (3.9 MGy) COMPLICATIONS: None immediate. PROCEDURE: The procedure, risks, benefits, and alternatives were explained to the patient. Questions regarding the procedure were encouraged and answered. The patient understands and consents to the procedure. The right neck and chest were prepped with chlorhexidine in a sterile fashion, and a sterile drape was applied covering the operative field. Maximum barrier sterile technique with sterile gowns and gloves were used  for the procedure. A timeout was performed prior to the initiation of the procedure. Local anesthesia was provided with 1% lidocaine with epinephrine. After creating a small venotomy incision, a micropuncture kit was utilized to access the internal jugular vein under direct, real-time ultrasound guidance. Ultrasound image documentation was performed. The microwire was kinked to measure appropriate catheter length. A subcutaneous port pocket was then created along the upper chest wall utilizing a combination of sharp and blunt dissection. The pocket was irrigated with sterile saline. A single lumen ISP power injectable port was chosen for placement. The 8 Fr catheter was tunneled from the port pocket site to the venotomy incision. The port was placed in the pocket. The external catheter was trimmed to  appropriate length. At the venotomy, an 8 Fr peel-away sheath was placed over a guidewire under fluoroscopic guidance. The catheter was then placed through the sheath and the sheath was removed. Final catheter positioning was confirmed and documented with a fluoroscopic spot radiograph. The port was accessed with a Huber needle, aspirated and flushed with heparinized saline. The venotomy site was closed with an interrupted 4-0 Vicryl suture. The port pocket incision was closed with interrupted 2-0 Vicryl suture and the skin was opposed with a running subcuticular 4-0 Vicryl suture. Dermabond and Steri-strips were applied to both incisions. Dressings were placed. The patient tolerated the procedure well without immediate post procedural complication. FINDINGS: After catheter placement, the tip lies within the superior cavoatrial junction. The catheter aspirates and flushes normally and is ready for immediate use. IMPRESSION: Successful placement of a right internal jugular approach power injectable Port-A-Cath. The catheter is ready for immediate use. Electronically Signed   By: Sandi Mariscal M.D.   On: 12/15/2016 14:59    Ir Fluoro Guide Port Insertion Right  Result Date: 12/15/2016 INDICATION: History of endometrial cancer. In need of durable intravenous access for chemotherapy administration. EXAM: IMPLANTED PORT A CATH PLACEMENT WITH ULTRASOUND AND FLUOROSCOPIC GUIDANCE COMPARISON:  CT the chest, abdomen and pelvis - 11/29/2016 MEDICATIONS: Ancef 2 gm IV; The antibiotic was administered within an appropriate time interval prior to skin puncture. ANESTHESIA/SEDATION: Moderate (conscious) sedation was employed during this procedure. A total of Versed 4 mg and Fentanyl 200 mcg was administered intravenously. Moderate Sedation Time: 28 minutes. The patient's level of consciousness and vital signs were monitored continuously by radiology nursing throughout the procedure under my direct supervision. CONTRAST:  None FLUOROSCOPY TIME:  24 seconds (3.9 MGy) COMPLICATIONS: None immediate. PROCEDURE: The procedure, risks, benefits, and alternatives were explained to the patient. Questions regarding the procedure were encouraged and answered. The patient understands and consents to the procedure. The right neck and chest were prepped with chlorhexidine in a sterile fashion, and a sterile drape was applied covering the operative field. Maximum barrier sterile technique with sterile gowns and gloves were used for the procedure. A timeout was performed prior to the initiation of the procedure. Local anesthesia was provided with 1% lidocaine with epinephrine. After creating a small venotomy incision, a micropuncture kit was utilized to access the internal jugular vein under direct, real-time ultrasound guidance. Ultrasound image documentation was performed. The microwire was kinked to measure appropriate catheter length. A subcutaneous port pocket was then created along the upper chest wall utilizing a combination of sharp and blunt dissection. The pocket was irrigated with sterile saline. A single lumen ISP power injectable port was chosen  for placement. The 8 Fr catheter was tunneled from the port pocket site to the venotomy incision. The port was placed in the pocket. The external catheter was trimmed to appropriate length. At the venotomy, an 8 Fr peel-away sheath was placed over a guidewire under fluoroscopic guidance. The catheter was then placed through the sheath and the sheath was removed. Final catheter positioning was confirmed and documented with a fluoroscopic spot radiograph. The port was accessed with a Huber needle, aspirated and flushed with heparinized saline. The venotomy site was closed with an interrupted 4-0 Vicryl suture. The port pocket incision was closed with interrupted 2-0 Vicryl suture and the skin was opposed with a running subcuticular 4-0 Vicryl suture. Dermabond and Steri-strips were applied to both incisions. Dressings were placed. The patient tolerated the procedure well without immediate post procedural complication. FINDINGS: After  catheter placement, the tip lies within the superior cavoatrial junction. The catheter aspirates and flushes normally and is ready for immediate use. IMPRESSION: Successful placement of a right internal jugular approach power injectable Port-A-Cath. The catheter is ready for immediate use. Electronically Signed   By: Sandi Mariscal M.D.   On: 12/15/2016 14:59    ASSESSMENT & PLAN:  Endometrial cancer ([place]) She tolerated chemotherapy fairly well except for expected side effects from treatment which is manageable We will continue cycle 2 of treatment without dose adjustment  Type 2 diabetes mellitus ([place]) She has severe hyperglycemia due to steroid premedications I plan to reduce future premedication to 12 mg at midnight and 12 mg at 6:00 in the morning, on the days before each treatment Today, I will give her some insulin She will continue metformin as instructed  Tobacco user I spent some time counseling the patient the importance of tobacco cessation. We discussed common  strategies including nicotine patches, Tobacco Quit-line, and other nicotine replacement products to assist in hereffort to quit  she appears motivated to quit. I gave her prescription of nicotine patch to try She is attempting to quit  Chemotherapy-induced nausea She is instructed to take antiemetics on a scheduled basis on day 3 after each cycle of treatment  Insomnia due to drug [facility]) This is related to steroid treatment Hopefully, with dose adjustment, insomnia might go away For now, continue conservative management only  Menopausal syndrome (hot flashes) She had recent frequent hot flashes likely due to menopausal symptoms It is not debilitating We will monitor that for now   No orders of the defined types were placed in this encounter.  All questions were answered. The patient knows to call the clinic with any problems, questions or concerns. No barriers to learning was detected. I spent 20 minutes counseling the patient face to face. The total time spent in the appointment was 25 minutes and more than 50% was on counseling and review of test results     Heath Lark, MD 01/12/2017 2:40 PM

## 2017-01-13 ENCOUNTER — Telehealth: Payer: Self-pay | Admitting: *Deleted

## 2017-01-13 ENCOUNTER — Other Ambulatory Visit: Payer: Self-pay | Admitting: Hematology and Oncology

## 2017-01-13 ENCOUNTER — Other Ambulatory Visit: Payer: Self-pay | Admitting: Family

## 2017-01-13 DIAGNOSIS — E559 Vitamin D deficiency, unspecified: Secondary | ICD-10-CM

## 2017-01-13 DIAGNOSIS — F411 Generalized anxiety disorder: Secondary | ICD-10-CM

## 2017-01-13 LAB — VITAMIN D 25 HYDROXY (VIT D DEFICIENCY, FRACTURES): VIT D 25 HYDROXY: 11.5 ng/mL — AB (ref 30.0–100.0)

## 2017-01-13 MED ORDER — VITAMIN D (ERGOCALCIFEROL) 1.25 MG (50000 UNIT) PO CAPS: ORAL_CAPSULE | ORAL | 0 refills | Status: DC

## 2017-01-13 NOTE — Telephone Encounter (Signed)
Notified of message below

## 2017-01-13 NOTE — Telephone Encounter (Signed)
LM to call regarding Vitamin D

## 2017-01-13 NOTE — Telephone Encounter (Signed)
-----   Message from Heath Lark, MD sent at 01/13/2017  6:57 AM EDT ----- Regarding: low vitamin D Let her know vitamin D is very low Please call in Vitamin D 50,000 units PO weekly x 12 weeks, no refills ----- Message ----- From: Interface, Lab In Three Zero One Sent: 01/12/2017   9:54 AM To: Heath Lark, MD

## 2017-01-14 ENCOUNTER — Ambulatory Visit (HOSPITAL_BASED_OUTPATIENT_CLINIC_OR_DEPARTMENT_OTHER): Payer: BLUE CROSS/BLUE SHIELD

## 2017-01-14 VITALS — BP 141/80 | HR 89 | Temp 97.5°F | Resp 18

## 2017-01-14 DIAGNOSIS — C541 Malignant neoplasm of endometrium: Secondary | ICD-10-CM | POA: Diagnosis not present

## 2017-01-14 DIAGNOSIS — Z5189 Encounter for other specified aftercare: Secondary | ICD-10-CM

## 2017-01-14 DIAGNOSIS — C7982 Secondary malignant neoplasm of genital organs: Secondary | ICD-10-CM | POA: Diagnosis not present

## 2017-01-14 MED ORDER — PEGFILGRASTIM INJECTION 6 MG/0.6ML ~~LOC~~
6.0000 mg | PREFILLED_SYRINGE | Freq: Once | SUBCUTANEOUS | Status: AC
  Administered 2017-01-14: 6 mg via SUBCUTANEOUS
  Filled 2017-01-14: qty 0.6

## 2017-01-14 NOTE — Patient Instructions (Signed)
Pegfilgrastim injection What is this medicine? PEGFILGRASTIM (PEG fil gra stim) is a long-acting granulocyte colony-stimulating factor that stimulates the growth of neutrophils, a type of white blood cell important in the body's fight against infection. It is used to reduce the incidence of fever and infection in patients with certain types of cancer who are receiving chemotherapy that affects the bone marrow, and to increase survival after being exposed to high doses of radiation. This medicine may be used for other purposes; ask your health care provider or pharmacist if you have questions. COMMON BRAND NAME(S): Neulasta What should I tell my health care provider before I take this medicine? They need to know if you have any of these conditions: -kidney disease -latex allergy -ongoing radiation therapy -sickle cell disease -skin reactions to acrylic adhesives (On-Body Injector only) -an unusual or allergic reaction to pegfilgrastim, filgrastim, other medicines, foods, dyes, or preservatives -pregnant or trying to get pregnant -breast-feeding How should I use this medicine? This medicine is for injection under the skin. If you get this medicine at home, you will be taught how to prepare and give the pre-filled syringe or how to use the On-body Injector. Refer to the patient Instructions for Use for detailed instructions. Use exactly as directed. Tell your healthcare provider immediately if you suspect that the On-body Injector may not have performed as intended or if you suspect the use of the On-body Injector resulted in a missed or partial dose. It is important that you put your used needles and syringes in a special sharps container. Do not put them in a trash can. If you do not have a sharps container, call your pharmacist or healthcare provider to get one. Talk to your pediatrician regarding the use of this medicine in children. While this drug may be prescribed for selected conditions,  precautions do apply. Overdosage: If you think you have taken too much of this medicine contact a poison control center or emergency room at once. NOTE: This medicine is only for you. Do not share this medicine with others. What if I miss a dose? It is important not to miss your dose. Call your doctor or health care professional if you miss your dose. If you miss a dose due to an On-body Injector failure or leakage, a new dose should be administered as soon as possible using a single prefilled syringe for manual use. What may interact with this medicine? Interactions have not been studied. Give your health care provider a list of all the medicines, herbs, non-prescription drugs, or dietary supplements you use. Also tell them if you smoke, drink alcohol, or use illegal drugs. Some items may interact with your medicine. This list may not describe all possible interactions. Give your health care provider a list of all the medicines, herbs, non-prescription drugs, or dietary supplements you use. Also tell them if you smoke, drink alcohol, or use illegal drugs. Some items may interact with your medicine. What should I watch for while using this medicine? You may need blood work done while you are taking this medicine. If you are going to need a MRI, CT scan, or other procedure, tell your doctor that you are using this medicine (On-Body Injector only). What side effects may I notice from receiving this medicine? Side effects that you should report to your doctor or health care professional as soon as possible: -allergic reactions like skin rash, itching or hives, swelling of the face, lips, or tongue -dizziness -fever -pain, redness, or irritation at site   where injected -pinpoint red spots on the skin -red or dark-brown urine -shortness of breath or breathing problems -stomach or side pain, or pain at the shoulder -swelling -tiredness -trouble passing urine or change in the amount of urine Side  effects that usually do not require medical attention (report to your doctor or health care professional if they continue or are bothersome): -bone pain -muscle pain This list may not describe all possible side effects. Call your doctor for medical advice about side effects. You may report side effects to FDA at 1-800-FDA-1088. Where should I keep my medicine? Keep out of the reach of children. Store pre-filled syringes in a refrigerator between 2 and 8 degrees C (36 and 46 degrees F). Do not freeze. Keep in carton to protect from light. Throw away this medicine if it is left out of the refrigerator for more than 48 hours. Throw away any unused medicine after the expiration date. NOTE: This sheet is a summary. It may not cover all possible information. If you have questions about this medicine, talk to your doctor, pharmacist, or health care provider.  2018 Elsevier/Gold Standard (2016-07-29 12:58:03)  

## 2017-01-18 ENCOUNTER — Telehealth: Payer: Self-pay | Admitting: *Deleted

## 2017-01-18 NOTE — Telephone Encounter (Signed)
Called patient to inform of [facility], spoke with patient and she is aware of these appts. 

## 2017-01-26 ENCOUNTER — Telehealth: Payer: Self-pay | Admitting: *Deleted

## 2017-01-26 DIAGNOSIS — Z716 Tobacco abuse counseling: Secondary | ICD-10-CM | POA: Diagnosis not present

## 2017-01-26 DIAGNOSIS — F17210 Nicotine dependence, cigarettes, uncomplicated: Secondary | ICD-10-CM | POA: Diagnosis not present

## 2017-01-26 DIAGNOSIS — F419 Anxiety disorder, unspecified: Secondary | ICD-10-CM | POA: Diagnosis not present

## 2017-01-26 DIAGNOSIS — E559 Vitamin D deficiency, unspecified: Secondary | ICD-10-CM | POA: Diagnosis not present

## 2017-01-26 DIAGNOSIS — Z719 Counseling, unspecified: Secondary | ICD-10-CM | POA: Diagnosis not present

## 2017-01-26 DIAGNOSIS — Z008 Encounter for other general examination: Secondary | ICD-10-CM | POA: Diagnosis not present

## 2017-01-26 DIAGNOSIS — E119 Type 2 diabetes mellitus without complications: Secondary | ICD-10-CM | POA: Diagnosis not present

## 2017-01-26 NOTE — Telephone Encounter (Signed)
CALLED PATIENT TO REMIND OF [facility] [place] FOR 01-27-17, LVM FOR A RETURN CALL

## 2017-01-27 ENCOUNTER — Ambulatory Visit
Admission: RE | Admit: 2017-01-27 | Discharge: 2017-01-27 | Disposition: A | Discharge status: Home / Self Care | Payer: BLUE CROSS/BLUE SHIELD | Source: Ambulatory Visit | Attending: Radiation Oncology | Admitting: Radiation Oncology

## 2017-01-27 ENCOUNTER — Encounter: Payer: Self-pay | Admitting: Radiation Oncology

## 2017-01-27 DIAGNOSIS — Z90722 Acquired absence of ovaries, bilateral: Secondary | ICD-10-CM | POA: Diagnosis not present

## 2017-01-27 DIAGNOSIS — C7961 Secondary malignant neoplasm of right ovary: Secondary | ICD-10-CM

## 2017-01-27 DIAGNOSIS — Z809 Family history of malignant neoplasm, unspecified: Secondary | ICD-10-CM | POA: Diagnosis not present

## 2017-01-27 DIAGNOSIS — C541 Malignant neoplasm of endometrium: Secondary | ICD-10-CM

## 2017-01-27 DIAGNOSIS — Z8249 Family history of ischemic heart disease and other diseases of the circulatory system: Secondary | ICD-10-CM | POA: Diagnosis not present

## 2017-01-27 DIAGNOSIS — Z7984 Long term (current) use of oral hypoglycemic drugs: Secondary | ICD-10-CM | POA: Diagnosis not present

## 2017-01-27 DIAGNOSIS — Z90710 Acquired absence of both cervix and uterus: Secondary | ICD-10-CM | POA: Diagnosis not present

## 2017-01-27 DIAGNOSIS — Z79899 Other long term (current) drug therapy: Secondary | ICD-10-CM | POA: Diagnosis not present

## 2017-01-27 DIAGNOSIS — F419 Anxiety disorder, unspecified: Secondary | ICD-10-CM | POA: Diagnosis not present

## 2017-01-27 DIAGNOSIS — Z8261 Family history of arthritis: Secondary | ICD-10-CM | POA: Diagnosis not present

## 2017-01-27 DIAGNOSIS — E119 Type 2 diabetes mellitus without complications: Secondary | ICD-10-CM | POA: Diagnosis not present

## 2017-01-27 DIAGNOSIS — M1990 Unspecified osteoarthritis, unspecified site: Secondary | ICD-10-CM | POA: Diagnosis not present

## 2017-01-27 DIAGNOSIS — Z85828 Personal history of other malignant neoplasm of skin: Secondary | ICD-10-CM | POA: Diagnosis not present

## 2017-01-27 DIAGNOSIS — F17210 Nicotine dependence, cigarettes, uncomplicated: Secondary | ICD-10-CM | POA: Diagnosis not present

## 2017-01-27 MED ORDER — FLUCONAZOLE 150 MG PO TABS: 150.0000 mg | ORAL_TABLET | Freq: Every day | ORAL | 0 refills | Status: DC

## 2017-01-27 NOTE — Progress Notes (Signed)
[name] presents for HDR Vag Cuff procedure today. She denies pain. She is still receiving chemotherapy every 3 weeks, with her next dose next week. She will receive 4 more treatments per her report. She reports vaginal itching and swelling with chemotherapy. She has been treated for a yeast infection recently by Dr. Alvy Bimler. She denies vaginal discharge or bleeding. She tells me she is eating well.   BP 133/86   Pulse 92   Temp 98.2 F (36.8 C)   Wt 163 lb 9.6 oz (74.2 kg)   LMP 07/25/2014 Comment: perimenopausal  SpO2 98% Comment: room air  BMI 24.88 kg/m    Wt Readings from Last 3 Encounters:  01/27/17 163 lb 9.6 oz (74.2 kg)  01/12/17 160 lb 14.4 oz (73 kg)  12/15/16 163 lb 8 oz (74.2 kg)

## 2017-01-27 NOTE — Progress Notes (Signed)
Radiation Oncology         [phone]) [phone] ________________________________  Name: [name] MRN: [id number]  Date: 01/27/2017  DOB: [birth year]  Vaginal Brachytherapy Procedure Note  CC: [name], FNP [name], MD    ICD-10-CM   1. Endometrial cancer ([place]) C54.1   2. 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 She denies pain. She is still receiving chemotherapy every 3 weeks, with her next dose next week. She will receive 4 more treatments per her report. She notes vaginal itching, and swelling with chemotherapy. She has been treated for a yeast infection by Dr. Alvy Bimler. She denies vaginal bleeding or discharge. She reports a good appetite.  ALLERGIES: has No Known Allergies.  Meds: Current Outpatient Prescriptions  Medication Sig Dispense Refill  . cyclobenzaprine (FLEXERIL) 10 MG tablet Take 10 mg by mouth as needed for muscle spasms.    Marland Kitchen dexamethasone (DECADRON) 4 MG tablet Take 5 tablets at midnight and 5 tablets at 6 am on the day of chemotherapy, every 3 weeks 30 tablet 1  . escitalopram (LEXAPRO) 10 MG tablet TAKE ONE TABLET BY MOUTH ONCE DAILY 30 tablet 0  . ibuprofen (IBU) 600 MG tablet Take 1 tablet (600 mg total) by mouth every 6 (six) hours as needed. 30 tablet 0  . lidocaine-prilocaine (EMLA) cream Apply to affected area once 30 g 3  . loratadine (CLARITIN) 10 MG tablet Take 10 mg by mouth daily.    . metFORMIN (GLUCOPHAGE-XR) 500 MG 24 hr tablet Take 1 tablet (500 mg total) by mouth daily. 30 tablet 2  . ondansetron (ZOFRAN) 8 MG tablet Take 1 tablet (8 mg total) by mouth 2 (two) times daily as needed for refractory nausea / vomiting. Start on day 3 after chemo. 30 tablet 1  . prochlorperazine (COMPAZINE) 10 MG tablet Take 1 tablet (10 mg total) by mouth  every 6 (six) hours as needed (Nausea or vomiting). 30 tablet 1  . Vitamin D, Ergocalciferol, (DRISDOL) 50000 units CAPS capsule Take 1 tablet every week for 12 weeks 12 capsule 0  . fluconazole (DIFLUCAN) 150 MG tablet Take 1 tablet (150 mg total) by mouth daily. 2 tablet 0  . nicotine (NICODERM CQ - DOSED IN MG/24 HOURS) 21 mg/24hr patch Place 1 patch (21 mg total) onto the skin daily. (Patient not taking: Reported on 12/09/2016) 28 patch 0  . oxyCODONE-acetaminophen (PERCOCET/ROXICET) 5-325 MG tablet Take 1-2 tablets by mouth every 4 (four) hours as needed (moderate to severe pain (when tolerating fluids)). (Patient not taking: Reported on 01/27/2017) 30 tablet 0  . senna (SENOKOT) 8.6 MG TABS tablet Take 1 tablet (8.6 mg total) by mouth at bedtime. (Patient not taking: Reported on 12/09/2016) 120 each 0   No current facility-administered medications for this encounter.     Physical Findings: The patient is in no acute distress. Patient is alert and oriented.  weight is 163 lb 9.6 oz (74.2 kg). Her temperature is 98.2 F (36.8 C). Her blood pressure is 133/86 and her pulse is 92. Her oxygen saturation is 98%.   No palpable cervical, supraclavicular or axillary lymphoadenopathy. The heart has a regular rate and rhythm. The lungs are clear to auscultation. Abdomen soft and non-tender.  On pelvic examination the external genitalia were unremarkable. A speculum exam was performed. Vaginal cuff intact, no mucosal lesions. On  bimanual exam there were no pelvic masses appreciated. The vaginal discharge consistent with yeast infection. The patient also was noted to have some erythema to the labia.  Lab Findings: Lab Results  Component Value Date   WBC 13.6 (H) 01/12/2017   HGB 13.8 01/12/2017   HCT 40.4 01/12/2017   MCV 88.4 01/12/2017   PLT 305 01/12/2017    Radiographic Findings: No results found.  Impression: Stage IIIA endometrial cancer (right ovarian involvement with deep myometrial  uterine involvement)  Patient was fitted for a vaginal cylinder. The patient will be treated with a 2.5 cm diameter cylinder with a treatment length of 4 cm. This distended the vaginal vault without undue discomfort. The patient tolerated the procedure well.  The patient was successfully fitted for a vaginal cylinder. The patient is appropriate to begin vaginal brachytherapy.   Plan: The patient will proceed with CT simulation and vaginal brachytherapy today.  She will be given a prescription for Diflucan in light of her vaginal yeast infection. This will be a 2 day course so as  not to interfere with upcoming chemotherapy.  _______________________________   Blair Promise, PhD, MD This document serves as a record of services personally performed by Gery Pray, MD. It was created on his behalf by Bethann Humble, a trained medical scribe. The creation of this record is based on the scribe's personal observations and the provider's statements to them. This document has been checked and approved by the attending provider.

## 2017-01-27 NOTE — Progress Notes (Signed)
  Radiation Oncology         [phone] ________________________________  Name: [name] MRN: [id number]  Date: 01/27/2017  DOB: [birth year]  CC: [name], FNP  [name], MD  HDR BRACHYTHERAPY NOTE  DIAGNOSIS: Stage IIIA endometrial cancer (right ovarian involvement with deep myometrial uterine involvement)   Simple treatment device note: Patient had construction of her custom vaginal cylinder. She will be treated with a 2.5 cm diameter segmented cylinder. This conforms to her anatomy without undue discomfort.  Vaginal brachytherapy procedure node: The patient was brought to the [facility] suite. Identity was confirmed. All relevant records and images related to the planned course of therapy were reviewed. The patient freely provided informed written consent to proceed with treatment after reviewing the details related to the planned course of therapy. The consent form was witnessed and verified by the simulation staff. Then, the patient was set-up in a stable reproducible supine position for radiation therapy. Pelvic exam revealed the vaginal cuff to be intact. The patient's custom vaginal cylinder was placed in the proximal vagina. This was affixed to the CT/MR stabilization plate to prevent slippage. Patient tolerated the placement well.  Verification simulation note:  A fiducial marker was placed within the vaginal cylinder. An AP and lateral film was then obtained through the pelvis area. This documented accurate position of the vaginal cylinder for treatment.  HDR BRACHYTHERAPY TREATMENT  The remote afterloading device was affixed to the vaginal cylinder by catheter. Patient then proceeded to undergo her first high-dose-rate treatment directed at the proximal vagina. The patient was prescribed a dose of 6 gray to be delivered to the mucosal surface. Treatment length was 4 cm. Patient was treated with 1 channel using 9 dwell positions. Treatment time was 267.6 seconds.  Iridium 192 was the high-dose-rate source for treatment. The patient tolerated the treatment well. After completion of her therapy, a radiation survey was performed documenting return of the iridium source into the GammaMed safe.   PLAN: The patient will return next week for her second treatment. ________________________________  Blair Promise, PhD, MD  This document serves as a record of services personally performed by Gery Pray, MD. It was created on his behalf by Bethann Humble, a trained medical scribe. The creation of this record is based on the scribe's personal observations and the provider's statements to them. This document has been checked and approved by the attending provider.

## 2017-01-27 NOTE — Progress Notes (Signed)
  Radiation Oncology         [phone]) [phone] ________________________________  Name: [name] MRN: [id number]  Date: 01/27/2017  DOB: 1965/12/18  SIMULATION AND TREATMENT PLANNING NOTE HDR BRACHYTHERAPY  DIAGNOSIS: Stage IIIA endometrial cancer (right ovarian involvement with deep myometrial uterine involvement)  NARRATIVE:  The patient was brought to the [facility].  Identity was confirmed.  All relevant records and images related to the planned course of therapy were reviewed.  The patient freely provided informed written consent to proceed with treatment after reviewing the details related to the planned course of therapy. The consent form was witnessed and verified by the simulation staff.  Then, the patient was set-up in a stable reproducible  supine position for radiation therapy.  CT images were obtained.  Surface markings were placed.  The CT images were loaded into the planning software.  Then the target and avoidance structures were contoured.  Treatment planning then occurred.  The radiation prescription was entered and confirmed.   I have requested : Brachytherapy Isodose Plan and Dosimetry Calculations to plan the radiation distribution.    PLAN:  The patient will receive 30 Gy in 5 fractions using high dose rate source Ir-192. The patient will be treated with a 2.5 cm diameter cylinder with a 4 cm treatment length.    ________________________________  Blair Promise, PhD, MD   This document serves as a record of services personally performed by Gery Pray, MD. It was created on his behalf by Bethann Humble, a trained medical scribe. The creation of this record is based on the scribe's personal observations and the provider's statements to them. This document has been checked and approved by the attending provider.

## 2017-01-28 ENCOUNTER — Telehealth: Payer: Self-pay | Admitting: *Deleted

## 2017-01-28 ENCOUNTER — Encounter: Payer: Self-pay | Admitting: Radiation Oncology

## 2017-01-28 NOTE — Telephone Encounter (Signed)
Called patient to remind of HDR Tx. For 01-31-17 @ 10 am, lvm for a return call

## 2017-01-31 ENCOUNTER — Ambulatory Visit
Admission: RE | Admit: 2017-01-31 | Discharge: 2017-01-31 | Disposition: A | Discharge status: Home / Self Care | Payer: BLUE CROSS/BLUE SHIELD | Source: Ambulatory Visit | Attending: Radiation Oncology | Admitting: Radiation Oncology

## 2017-01-31 ENCOUNTER — Ambulatory Visit: Payer: BLUE CROSS/BLUE SHIELD | Admitting: Radiation Oncology

## 2017-01-31 ENCOUNTER — Other Ambulatory Visit: Payer: Self-pay | Admitting: Family

## 2017-01-31 DIAGNOSIS — F419 Anxiety disorder, unspecified: Secondary | ICD-10-CM | POA: Diagnosis not present

## 2017-01-31 DIAGNOSIS — E119 Type 2 diabetes mellitus without complications: Secondary | ICD-10-CM | POA: Diagnosis not present

## 2017-01-31 DIAGNOSIS — C541 Malignant neoplasm of endometrium: Secondary | ICD-10-CM

## 2017-01-31 DIAGNOSIS — Z7984 Long term (current) use of oral hypoglycemic drugs: Secondary | ICD-10-CM | POA: Diagnosis not present

## 2017-01-31 DIAGNOSIS — Z8261 Family history of arthritis: Secondary | ICD-10-CM | POA: Diagnosis not present

## 2017-01-31 DIAGNOSIS — Z85828 Personal history of other malignant neoplasm of skin: Secondary | ICD-10-CM | POA: Diagnosis not present

## 2017-01-31 DIAGNOSIS — Z79899 Other long term (current) drug therapy: Secondary | ICD-10-CM | POA: Diagnosis not present

## 2017-01-31 DIAGNOSIS — F17210 Nicotine dependence, cigarettes, uncomplicated: Secondary | ICD-10-CM | POA: Diagnosis not present

## 2017-01-31 DIAGNOSIS — C7961 Secondary malignant neoplasm of right ovary: Secondary | ICD-10-CM | POA: Diagnosis not present

## 2017-01-31 DIAGNOSIS — M1990 Unspecified osteoarthritis, unspecified site: Secondary | ICD-10-CM | POA: Diagnosis not present

## 2017-01-31 DIAGNOSIS — F411 Generalized anxiety disorder: Secondary | ICD-10-CM

## 2017-01-31 DIAGNOSIS — Z90722 Acquired absence of ovaries, bilateral: Secondary | ICD-10-CM | POA: Diagnosis not present

## 2017-01-31 DIAGNOSIS — Z8249 Family history of ischemic heart disease and other diseases of the circulatory system: Secondary | ICD-10-CM | POA: Diagnosis not present

## 2017-01-31 DIAGNOSIS — Z809 Family history of malignant neoplasm, unspecified: Secondary | ICD-10-CM | POA: Diagnosis not present

## 2017-01-31 DIAGNOSIS — Z90710 Acquired absence of both cervix and uterus: Secondary | ICD-10-CM | POA: Diagnosis not present

## 2017-01-31 NOTE — Progress Notes (Signed)
  Radiation Oncology         [phone]) [phone] ________________________________  Name: [name] MRN: [id number]  Date: 01/31/2017  DOB: February 23, 1966  CC: [name], FNP  [name], MD  HDR BRACHYTHERAPY NOTE  DIAGNOSIS: Stage IIIA endometrial cancer (right ovarian involvement with deep myometrial uterine involvement)   Simple treatment device note: Patient had construction of her custom vaginal cylinder. She will be treated with a 2.5 cm diameter segmented cylinder. This conforms to her anatomy without undue discomfort.  Vaginal brachytherapy procedure node: The patient was brought to the [facility] suite. Identity was confirmed. All relevant records and images related to the planned course of therapy were reviewed. The patient freely provided informed written consent to proceed with treatment after reviewing the details related to the planned course of therapy. The consent form was witnessed and verified by the simulation staff. Then, the patient was set-up in a stable reproducible supine position for radiation therapy. Pelvic exam revealed the vaginal cuff to be intact. The patient's custom vaginal cylinder was placed in the proximal vagina. This was affixed to the CT/MR stabilization plate to prevent slippage. Patient tolerated the placement well.  Verification simulation note:  A fiducial marker was placed within the vaginal cylinder. An AP and lateral film was then obtained through the pelvis area. This documented accurate position of the vaginal cylinder for treatment.  HDR BRACHYTHERAPY TREATMENT  The remote afterloading device was affixed to the vaginal cylinder by catheter. Patient then proceeded to undergo her second high-dose-rate treatment directed at the proximal vagina. The patient was prescribed a dose of 6 gray to be delivered to the mucosal surface. Treatment length was 4 cm. Patient was treated with 1 channel using 9 dwell positions. Treatment time was 277.9 seconds.  Iridium 192 was the high-dose-rate source for treatment. The patient tolerated the treatment well. After completion of her therapy, a radiation survey was performed documenting return of the iridium source into the GammaMed safe.   IMPRESSION/PLAN: The patient is having less vaginal itching after being placed on Diflucan last week. The patient will return 02/03/17 for her third HDR treatment. Chemotherapy in the interim.  -----------------------------------  Blair Promise, PhD, MD  This document serves as a record of services personally performed by Gery Pray, MD. It was created on his behalf by Maryla Morrow, a trained medical scribe. The creation of this record is based on the scribe's personal observations and the provider's statements to them. This document has been checked and approved by the attending provider.

## 2017-02-01 ENCOUNTER — Encounter: Payer: Self-pay | Admitting: Radiation Oncology

## 2017-02-02 ENCOUNTER — Other Ambulatory Visit (HOSPITAL_BASED_OUTPATIENT_CLINIC_OR_DEPARTMENT_OTHER): Payer: BLUE CROSS/BLUE SHIELD

## 2017-02-02 ENCOUNTER — Ambulatory Visit: Payer: BLUE CROSS/BLUE SHIELD

## 2017-02-02 ENCOUNTER — Telehealth: Payer: Self-pay | Admitting: *Deleted

## 2017-02-02 ENCOUNTER — Ambulatory Visit (HOSPITAL_BASED_OUTPATIENT_CLINIC_OR_DEPARTMENT_OTHER): Payer: BLUE CROSS/BLUE SHIELD

## 2017-02-02 ENCOUNTER — Encounter: Payer: Self-pay | Admitting: Radiation Oncology

## 2017-02-02 ENCOUNTER — Other Ambulatory Visit: Payer: Self-pay | Admitting: Hematology and Oncology

## 2017-02-02 ENCOUNTER — Telehealth: Payer: Self-pay | Admitting: Hematology and Oncology

## 2017-02-02 ENCOUNTER — Ambulatory Visit (HOSPITAL_BASED_OUTPATIENT_CLINIC_OR_DEPARTMENT_OTHER): Payer: BLUE CROSS/BLUE SHIELD | Admitting: Hematology and Oncology

## 2017-02-02 DIAGNOSIS — Z5111 Encounter for antineoplastic chemotherapy: Secondary | ICD-10-CM

## 2017-02-02 DIAGNOSIS — E119 Type 2 diabetes mellitus without complications: Secondary | ICD-10-CM

## 2017-02-02 DIAGNOSIS — T451X5A Adverse effect of antineoplastic and immunosuppressive drugs, initial encounter: Secondary | ICD-10-CM

## 2017-02-02 DIAGNOSIS — C541 Malignant neoplasm of endometrium: Secondary | ICD-10-CM | POA: Diagnosis not present

## 2017-02-02 DIAGNOSIS — E1100 Type 2 diabetes mellitus with hyperosmolarity without nonketotic hyperglycemic-hyperosmolar coma (NKHHC): Secondary | ICD-10-CM | POA: Diagnosis not present

## 2017-02-02 DIAGNOSIS — Z720 Tobacco use: Secondary | ICD-10-CM | POA: Diagnosis not present

## 2017-02-02 DIAGNOSIS — R110 Nausea: Secondary | ICD-10-CM | POA: Diagnosis not present

## 2017-02-02 DIAGNOSIS — C7982 Secondary malignant neoplasm of genital organs: Secondary | ICD-10-CM | POA: Diagnosis not present

## 2017-02-02 DIAGNOSIS — Z95828 Presence of other vascular implants and grafts: Secondary | ICD-10-CM

## 2017-02-02 LAB — CBC WITH DIFFERENTIAL/PLATELET
BASO%: 0.1 % (ref 0.0–2.0)
BASOS ABS: 0 10*3/uL (ref 0.0–0.1)
EOS ABS: 0 10*3/uL (ref 0.0–0.5)
EOS%: 0 % (ref 0.0–7.0)
HCT: 39.4 % (ref 34.8–46.6)
HGB: 13.5 g/dL (ref 11.6–15.9)
LYMPH%: 3.6 % — AB (ref 14.0–49.7)
MCH: 31.5 pg (ref 25.1–34.0)
MCHC: 34.1 g/dL (ref 31.5–36.0)
MCV: 92.3 fL (ref 79.5–101.0)
MONO#: 0.1 10*3/uL (ref 0.1–0.9)
MONO%: 0.7 % (ref 0.0–14.0)
NEUT%: 95.6 % — ABNORMAL HIGH (ref 38.4–76.8)
NEUTROS ABS: 8.2 10*3/uL — AB (ref 1.5–6.5)
Platelets: 198 10*3/uL (ref 145–400)
RBC: 4.27 10*6/uL (ref 3.70–5.45)
RDW: 16.6 % — ABNORMAL HIGH (ref 11.2–14.5)
WBC: 8.6 10*3/uL (ref 3.9–10.3)
lymph#: 0.3 10*3/uL — ABNORMAL LOW (ref 0.9–3.3)

## 2017-02-02 LAB — COMPREHENSIVE METABOLIC PANEL
ALT: 40 U/L (ref 0–55)
AST: 22 U/L (ref 5–34)
Albumin: 4.1 g/dL (ref 3.5–5.0)
Alkaline Phosphatase: 167 U/L — ABNORMAL HIGH (ref 40–150)
Anion Gap: 18 mEq/L — ABNORMAL HIGH (ref 3–11)
BUN: 17.8 mg/dL (ref 7.0–26.0)
CHLORIDE: 97 meq/L — AB (ref 98–109)
CO2: 19 meq/L — AB (ref 22–29)
Calcium: 9.9 mg/dL (ref 8.4–10.4)
Creatinine: 1 mg/dL (ref 0.6–1.1)
EGFR: 62 mL/min/{1.73_m2} — AB (ref >90)
GLUCOSE: 650 mg/dL — AB (ref 70–140)
POTASSIUM: 4.9 meq/L (ref 3.5–5.1)
SODIUM: 133 meq/L — AB (ref 136–145)
Total Bilirubin: 0.95 mg/dL (ref 0.20–1.20)
Total Protein: 7.3 g/dL (ref 6.4–8.3)

## 2017-02-02 LAB — WHOLE BLOOD GLUCOSE
GLUCOSE: 432 mg/dL — AB (ref 70–100)
GLUCOSE: 408 mg/dL — AB (ref 70–100)
GLUCOSE: 411 mg/dL — AB (ref 70–100)
HRS PC: 1 Hours
HRS PC: 0.75 h
HRS PC: 2 Hours

## 2017-02-02 MED ORDER — INSULIN REGULAR HUMAN 100 UNIT/ML IJ SOLN
15.0000 [IU] | Freq: Once | INTRAMUSCULAR | Status: AC
  Administered 2017-02-02: 15 [IU] via SUBCUTANEOUS
  Filled 2017-02-02: qty 0.15

## 2017-02-02 MED ORDER — SODIUM CHLORIDE 0.9% FLUSH
10.0000 mL | INTRAVENOUS | Status: DC | PRN
  Administered 2017-02-02: 10 mL via INTRAVENOUS
  Filled 2017-02-02: qty 10

## 2017-02-02 MED ORDER — PALONOSETRON HCL INJECTION 0.25 MG/5ML
0.2500 mg | Freq: Once | INTRAVENOUS | Status: AC
  Administered 2017-02-02: 0.25 mg via INTRAVENOUS

## 2017-02-02 MED ORDER — HEPARIN SOD (PORK) LOCK FLUSH 100 UNIT/ML IV SOLN
500.0000 [IU] | Freq: Once | INTRAVENOUS | Status: AC | PRN
  Administered 2017-02-02: 500 [IU]
  Filled 2017-02-02: qty 5

## 2017-02-02 MED ORDER — FAMOTIDINE IN NACL 20-0.9 MG/50ML-% IV SOLN
20.0000 mg | Freq: Once | INTRAVENOUS | Status: AC
  Administered 2017-02-02: 20 mg via INTRAVENOUS

## 2017-02-02 MED ORDER — DIPHENHYDRAMINE HCL 50 MG/ML IJ SOLN
50.0000 mg | Freq: Once | INTRAMUSCULAR | Status: AC
Start: 2017-02-02 — End: 2017-02-02
  Administered 2017-02-02: 50 mg via INTRAVENOUS

## 2017-02-02 MED ORDER — SODIUM CHLORIDE 0.9 % IV SOLN
Freq: Once | INTRAVENOUS | Status: AC
  Administered 2017-02-02: 11:00:00 via INTRAVENOUS
  Filled 2017-02-02: qty 5

## 2017-02-02 MED ORDER — PALONOSETRON HCL INJECTION 0.25 MG/5ML
INTRAVENOUS | Status: AC
  Filled 2017-02-02: qty 5

## 2017-02-02 MED ORDER — DIPHENHYDRAMINE HCL 50 MG/ML IJ SOLN
INTRAMUSCULAR | Status: AC
  Filled 2017-02-02: qty 1

## 2017-02-02 MED ORDER — SODIUM CHLORIDE 0.9 % IV SOLN
Freq: Once | INTRAVENOUS | Status: AC
  Administered 2017-02-02: 11:00:00 via INTRAVENOUS

## 2017-02-02 MED ORDER — SODIUM CHLORIDE 0.9 % IV SOLN
Freq: Once | INTRAVENOUS | Status: AC
  Administered 2017-02-02: 10:00:00 via INTRAVENOUS

## 2017-02-02 MED ORDER — SODIUM CHLORIDE 0.9 % IV SOLN
175.0000 mg/m2 | Freq: Once | INTRAVENOUS | Status: AC
  Administered 2017-02-02: 330 mg via INTRAVENOUS
  Filled 2017-02-02: qty 55

## 2017-02-02 MED ORDER — FAMOTIDINE IN NACL 20-0.9 MG/50ML-% IV SOLN
INTRAVENOUS | Status: AC
  Filled 2017-02-02: qty 50

## 2017-02-02 MED ORDER — SODIUM CHLORIDE 0.9% FLUSH
10.0000 mL | INTRAVENOUS | Status: DC | PRN
  Administered 2017-02-02: 10 mL
  Filled 2017-02-02: qty 10

## 2017-02-02 MED ORDER — DEXAMETHASONE 4 MG PO TABS: ORAL_TABLET | ORAL | 1 refills | Status: DC

## 2017-02-02 MED ORDER — SODIUM CHLORIDE 0.9 % IV SOLN
620.0000 mg | Freq: Once | INTRAVENOUS | Status: AC
  Administered 2017-02-02: 620 mg via INTRAVENOUS
  Filled 2017-02-02: qty 62

## 2017-02-02 MED ORDER — SODIUM CHLORIDE 0.9 % IV SOLN
Freq: Once | INTRAVENOUS | Status: AC
  Administered 2017-02-02: 16:00:00 via INTRAVENOUS

## 2017-02-02 NOTE — Telephone Encounter (Signed)
Called patient to remind of HDR  Tx. for 02-03-17 @ 10 am, lvm for a return call

## 2017-02-02 NOTE — Telephone Encounter (Signed)
Gave patient avs report and appointments for June and July.  °

## 2017-02-02 NOTE — Patient Instructions (Signed)
[facility] Discharge Instructions for Patients Receiving Chemotherapy  Today you received the following chemotherapy agents Taxol/Carboplatin To help prevent nausea and vomiting after your treatment, we encourage you to take your nausea medication as prescribed.   If you develop nausea and vomiting that is not controlled by your nausea medication, call the clinic.   BELOW ARE SYMPTOMS THAT SHOULD BE REPORTED IMMEDIATELY:  *FEVER GREATER THAN 100.5 F  *CHILLS WITH OR WITHOUT FEVER  NAUSEA AND VOMITING THAT IS NOT CONTROLLED WITH YOUR NAUSEA MEDICATION  *UNUSUAL SHORTNESS OF BREATH  *UNUSUAL BRUISING OR BLEEDING  TENDERNESS IN MOUTH AND THROAT WITH OR WITHOUT PRESENCE OF ULCERS  *URINARY PROBLEMS  *BOWEL PROBLEMS  UNUSUAL RASH Items with * indicate a potential emergency and should be followed up as soon as possible.  Feel free to call the clinic you have any questions or concerns. The clinic phone number is [phone].  Please show the CHEMO ALERT CARD at check-in to the Emergency Department and triage nurse.   

## 2017-02-02 NOTE — Progress Notes (Unsigned)
Give 250 Normal Saline over 30 minutes per Dr. Alvy Bimler due to hyperglycemia.    Patient CBG result 432 at approximately 12 p.m. Dr. Alvy Bimler notified. Give an additional 15 units of Insulin.    Patient CBG result 408. Dr. Alvy Bimler notified. Give an additional 15 units of insulin and recheck CBG before patient leaves.    Patient CBG result 411 Dr. Alvy Bimler notified. Give 500 mL saline bolus and discharge home per Dr. Alvy Bimler. Patient verbalized understanding.

## 2017-02-02 NOTE — Patient Instructions (Signed)

## 2017-02-03 ENCOUNTER — Ambulatory Visit
Admission: RE | Admit: 2017-02-03 | Discharge: 2017-02-03 | Disposition: A | Discharge status: Home / Self Care | Payer: BLUE CROSS/BLUE SHIELD | Source: Ambulatory Visit | Attending: Radiation Oncology | Admitting: Radiation Oncology

## 2017-02-03 ENCOUNTER — Encounter: Payer: Self-pay | Admitting: Hematology and Oncology

## 2017-02-03 DIAGNOSIS — Z8261 Family history of arthritis: Secondary | ICD-10-CM | POA: Diagnosis not present

## 2017-02-03 DIAGNOSIS — Z8249 Family history of ischemic heart disease and other diseases of the circulatory system: Secondary | ICD-10-CM | POA: Diagnosis not present

## 2017-02-03 DIAGNOSIS — Z90722 Acquired absence of ovaries, bilateral: Secondary | ICD-10-CM | POA: Diagnosis not present

## 2017-02-03 DIAGNOSIS — Z809 Family history of malignant neoplasm, unspecified: Secondary | ICD-10-CM | POA: Diagnosis not present

## 2017-02-03 DIAGNOSIS — M1990 Unspecified osteoarthritis, unspecified site: Secondary | ICD-10-CM | POA: Diagnosis not present

## 2017-02-03 DIAGNOSIS — Z7984 Long term (current) use of oral hypoglycemic drugs: Secondary | ICD-10-CM | POA: Diagnosis not present

## 2017-02-03 DIAGNOSIS — F17210 Nicotine dependence, cigarettes, uncomplicated: Secondary | ICD-10-CM | POA: Diagnosis not present

## 2017-02-03 DIAGNOSIS — F419 Anxiety disorder, unspecified: Secondary | ICD-10-CM | POA: Diagnosis not present

## 2017-02-03 DIAGNOSIS — C7961 Secondary malignant neoplasm of right ovary: Secondary | ICD-10-CM | POA: Diagnosis not present

## 2017-02-03 DIAGNOSIS — Z85828 Personal history of other malignant neoplasm of skin: Secondary | ICD-10-CM | POA: Diagnosis not present

## 2017-02-03 DIAGNOSIS — Z79899 Other long term (current) drug therapy: Secondary | ICD-10-CM | POA: Diagnosis not present

## 2017-02-03 DIAGNOSIS — C541 Malignant neoplasm of endometrium: Secondary | ICD-10-CM

## 2017-02-03 DIAGNOSIS — E119 Type 2 diabetes mellitus without complications: Secondary | ICD-10-CM | POA: Diagnosis not present

## 2017-02-03 DIAGNOSIS — Z90710 Acquired absence of both cervix and uterus: Secondary | ICD-10-CM | POA: Diagnosis not present

## 2017-02-03 NOTE — Assessment & Plan Note (Signed)
She tolerated chemotherapy fairly well except for expected side effects from treatment which is manageable We will continue cycle 3 of treatment without dose adjustment

## 2017-02-03 NOTE — Assessment & Plan Note (Signed)
She has severe hyperglycemia requiring multiple units of insulin during treatment Recommend reducing premedication to 12 mg of dexamethasone at midnight and 12 mg at 6:00 on the days of treatment We also discussed dietary modification

## 2017-02-03 NOTE — Progress Notes (Signed)
[facility] OFFICE PROGRESS NOTE  Patient Care Team: [name], FNP as PCP - General (Nurse Practitioner)  SUMMARY OF ONCOLOGIC HISTORY:   Endometrial cancer ([place])   09/22/2016 Initial Diagnosis    She presented to her physician with postmenopausal bleeding      10/13/2016 Imaging    The patient reported post-menopausal bleeding to her physician who performed a TVUS on 10/13/16. This showed a uterus measuring 7.6x4.6x 6.26cm. The endometrial lining measuringed 2.24cm. The ovaries were normal. A 4cm intracavitary mass was appreciated on [place].       10/13/2016 Pathology Results    The patient then underwent endometrial pipelle biopsy on 10/13/16. It revealed an endometrioid adenocarcinoma, (FIGO grade 1).       11/16/2016 Pathology Results    1. Lymph node, sentinel, biopsy, right obturator - TWO BENIGN LYMPH NODES (0/2). 2. Lymph node, sentinel, biopsy, right external iliac - ONE BENIGN LYMPH NODE (0/1). 3. Lymph node, sentinel, biopsy, left obturator - ONE BENIGN LYMPH NODE (0/1). 4. Uterus +/- tubes/ovaries, neoplastic - ENDOMETRIOID ADENOCARCINOMA, 5.2 CM. - CARCINOMA FOCALLY INVOLVES DEEP MYOMETRIUM. - RIGHT FALLOPIAN TUBE AND RIGHT OVARY INVOLVED BY ENDOMETRIOID CARCINOMA. - CERVIX, LEFT FALLOPIAN, AND LEFT OVARY FREE OF TUMOR. - SEE ONCOLOGY TABLE AND COMMENT.      11/16/2016 Surgery    Operation: Robotic-assisted laparoscopic total hysterectomy with bilateral salpingoophorectomy with bilateral SLN biopsy  Surgeon: Donaciano Eva  Operative Findings:  : 7cm normal appearing uterus, normal appearing tubes and ovaries, no suspicious nodes.      11/29/2016 Imaging    1. Interval hysterectomy.  No evidence of metastatic disease. 2. Postsurgical changes in the pelvis with minimal free fluid and soft tissue edema. 3. Stable colon diverticular changes without acute inflammation. 4. Suspected small incidental lesions within the liver and spleen. 5.  Stable sclerotic lesion in the left ischium, likely benign      12/15/2016 Procedure    Successful placement of a right internal jugular approach power injectable Port-A-Cath. The catheter is ready for immediate use      12/22/2016 -  Chemotherapy    She received carboplatin and Taxol       INTERVAL HISTORY: Please see below for problem oriented charting. She returns for further follow-up With her last treatment of chemotherapy, she has some mild nausea, hyperglycemia and fatigue Denies nausea, vomiting or constipation Denies peripheral neuropathy She has begun radiation treatment She was found to have recent yeast infection, treated with fluconazole  REVIEW OF SYSTEMS:   Constitutional: Denies fevers, chills or abnormal weight loss Eyes: Denies blurriness of vision Ears, nose, mouth, throat, and face: Denies mucositis or sore throat Respiratory: Denies cough, dyspnea or wheezes Cardiovascular: Denies palpitation, chest discomfort or lower extremity swelling Skin: Denies abnormal skin rashes Lymphatics: Denies new lymphadenopathy or easy bruising Neurological:Denies numbness, tingling or new weaknesses Behavioral/Psych: Mood is stable, no new changes  All other systems were reviewed with the patient and are negative.  I have reviewed the past medical history, past surgical history, social history and family history with the patient and they are unchanged from previous note.  ALLERGIES:  has No Known Allergies.  MEDICATIONS:  Current Outpatient Prescriptions  Medication Sig Dispense Refill  . cyclobenzaprine (FLEXERIL) 10 MG tablet Take 10 mg by mouth as needed for muscle spasms.    Marland Kitchen dexamethasone (DECADRON) 4 MG tablet Take 5 tablets at midnight and 5 tablets at 6 am on the day of chemotherapy, every 3 weeks 30  tablet 1  . escitalopram (LEXAPRO) 10 MG tablet TAKE 1 TABLET BY MOUTH ONCE DAILY 30 tablet 0  . fluconazole (DIFLUCAN) 150 MG tablet Take 1 tablet (150 mg total) by  mouth daily. 2 tablet 0  . ibuprofen (IBU) 600 MG tablet Take 1 tablet (600 mg total) by mouth every 6 (six) hours as needed. 30 tablet 0  . lidocaine-prilocaine (EMLA) cream Apply to affected area once 30 g 3  . loratadine (CLARITIN) 10 MG tablet Take 10 mg by mouth daily.    . metFORMIN (GLUCOPHAGE-XR) 500 MG 24 hr tablet Take 1 tablet (500 mg total) by mouth daily. 30 tablet 2  . oxyCODONE-acetaminophen (PERCOCET/ROXICET) 5-325 MG tablet Take 1-2 tablets by mouth every 4 (four) hours as needed (moderate to severe pain (when tolerating fluids)). 30 tablet 0  . prochlorperazine (COMPAZINE) 10 MG tablet Take 1 tablet (10 mg total) by mouth every 6 (six) hours as needed (Nausea or vomiting). 30 tablet 1  . Vitamin D, Ergocalciferol, (DRISDOL) 50000 units CAPS capsule Take 1 tablet every week for 12 weeks 12 capsule 0  . nicotine (NICODERM CQ - DOSED IN MG/24 HOURS) 21 mg/24hr patch Place 1 patch (21 mg total) onto the skin daily. (Patient not taking: Reported on 02/02/2017) 28 patch 0  . ondansetron (ZOFRAN) 8 MG tablet Take 1 tablet (8 mg total) by mouth 2 (two) times daily as needed for refractory nausea / vomiting. Start on day 3 after chemo. (Patient not taking: Reported on 02/02/2017) 30 tablet 1  . senna (SENOKOT) 8.6 MG TABS tablet Take 1 tablet (8.6 mg total) by mouth at bedtime. (Patient not taking: Reported on 12/09/2016) 120 each 0   No current facility-administered medications for this visit.    Facility-Administered Medications Ordered in Other Visits  Medication Dose Route Frequency Provider Last Rate Last Dose  . sodium chloride flush (NS) 0.9 % injection 10 mL  10 mL Intracatheter PRN Alvy Bimler, Mischele Detter, MD   10 mL at 02/02/17 1600    PHYSICAL EXAMINATION: ECOG PERFORMANCE STATUS: 1 - Symptomatic but completely ambulatory  Vitals:   02/02/17 0912  BP: 119/72  Pulse: (!) 106  Resp: 17  Temp: 97.9 F (36.6 C)   Filed Weights   02/02/17 0912  Weight: 161 lb 9.6 oz (73.3 kg)     GENERAL:alert, no distress and comfortable SKIN: skin color, texture, turgor are normal, no rashes or significant lesions EYES: normal, Conjunctiva are pink and non-injected, sclera clear OROPHARYNX:no exudate, no erythema and lips, buccal mucosa, and tongue normal  NECK: supple, thyroid normal size, non-tender, without nodularity LYMPH:  no palpable lymphadenopathy in the cervical, axillary or inguinal LUNGS: clear to auscultation and percussion with normal breathing effort HEART: regular rate & rhythm and no murmurs and no lower extremity edema ABDOMEN:abdomen soft, non-tender and normal bowel sounds Musculoskeletal:no cyanosis of digits and no clubbing  NEURO: alert & oriented x 3 with fluent speech, no focal motor/sensory deficits  LABORATORY DATA:  I have reviewed the data as listed    Component Value Date/Time   NA 133 (L) 02/02/2017 0849   K 4.9 02/02/2017 0849   CL 105 11/17/2016 0451   CO2 19 (L) 02/02/2017 0849   GLUCOSE 650 (HH) 02/02/2017 0849   BUN 17.8 02/02/2017 0849   CREATININE 1.0 02/02/2017 0849   CALCIUM 9.9 02/02/2017 0849   PROT 7.3 02/02/2017 0849   ALBUMIN 4.1 02/02/2017 0849   AST 22 02/02/2017 0849   ALT 40 02/02/2017 0849  ALKPHOS 167 (H) 02/02/2017 0849   BILITOT 0.95 02/02/2017 0849   GFRNONAA >60 11/17/2016 0451   GFRAA >60 11/17/2016 0451    No results found for: SPEP, UPEP  Lab Results  Component Value Date   WBC 8.6 02/02/2017   NEUTROABS 8.2 (H) 02/02/2017   HGB 13.5 02/02/2017   HCT 39.4 02/02/2017   MCV 92.3 02/02/2017   PLT 198 02/02/2017      Chemistry      Component Value Date/Time   NA 133 (L) 02/02/2017 0849   K 4.9 02/02/2017 0849   CL 105 11/17/2016 0451   CO2 19 (L) 02/02/2017 0849   BUN 17.8 02/02/2017 0849   CREATININE 1.0 02/02/2017 0849   GLU 411 (H) 02/02/2017 1530      Component Value Date/Time   CALCIUM 9.9 02/02/2017 0849   ALKPHOS 167 (H) 02/02/2017 0849   AST 22 02/02/2017 0849   ALT 40  02/02/2017 0849   BILITOT 0.95 02/02/2017 0849      ASSESSMENT & PLAN:  Endometrial cancer ([place]) She tolerated chemotherapy fairly well except for expected side effects from treatment which is manageable We will continue cycle 3 of treatment without dose adjustment  Type 2 diabetes mellitus ([place]) She has severe hyperglycemia requiring multiple units of insulin during treatment Recommend reducing premedication to 12 mg of dexamethasone at midnight and 12 mg at 6:00 on the days of treatment We also discussed dietary modification  Tobacco user I spent some time counseling the patient the importance of tobacco cessation. We discussed common strategies including nicotine patches, Tobacco Quit-line, and other nicotine replacement products to assist in hereffort to quit  she appears motivated to quit. I gave her prescription of nicotine patch to try She is attempting to quit  Chemotherapy-induced nausea She is instructed to take antiemetics on a scheduled basis on day 3 after each cycle of treatment   No orders of the defined types were placed in this encounter.  All questions were answered. The patient knows to call the clinic with any problems, questions or concerns. No barriers to learning was detected. I spent 20 minutes counseling the patient face to face. The total time spent in the appointment was 25 minutes and more than 50% was on counseling and review of test results     Heath Lark, MD 02/03/2017 3:46 PM

## 2017-02-03 NOTE — Progress Notes (Signed)
  Radiation Oncology         [phone]) [phone] ________________________________  Name: [name] MRN: [id number]  Date: 02/03/2017  DOB: 04-01-1966  CC: [name], FNP  [name], MD  HDR BRACHYTHERAPY NOTE  DIAGNOSIS: Stage IIIA endometrial cancer (right ovarian involvement with deep myometrial uterine involvement)   Simple treatment device note: Patient had construction of her custom vaginal cylinder. She will be treated with a 2.5 cm diameter segmented cylinder. This conforms to her anatomy without undue discomfort.  Vaginal brachytherapy procedure node: The patient was brought to the [facility] suite. Identity was confirmed. All relevant records and images related to the planned course of therapy were reviewed. The patient freely provided informed written consent to proceed with treatment after reviewing the details related to the planned course of therapy. The consent form was witnessed and verified by the simulation staff. Then, the patient was set-up in a stable reproducible supine position for radiation therapy. Pelvic exam revealed the vaginal cuff to be intact. The patient's custom vaginal cylinder was placed in the proximal vagina. This was affixed to the CT/MR stabilization plate to prevent slippage. Patient tolerated the placement well.  Verification simulation note:  A fiducial marker was placed within the vaginal cylinder. An AP and lateral film was then obtained through the pelvis area. This documented accurate position of the vaginal cylinder for treatment.  HDR BRACHYTHERAPY TREATMENT  The remote afterloading device was affixed to the vaginal cylinder by catheter. Patient then proceeded to undergo her third high-dose-rate treatment directed at the proximal vagina. The patient was prescribed a dose of 6 gray to be delivered to the mucosal surface. Treatment length was 4 cm. Patient was treated with 1 channel using 9 dwell positions. Treatment time was 285.7 seconds.  Iridium 192 was the high-dose-rate source for treatment. The patient tolerated the treatment well. After completion of her therapy, a radiation survey was performed documenting return of the iridium source into the Gamma[employer] safe.   IMPRESSION/PLAN: The patient will return 02/14/17 for her fourth HDR treatment.   -----------------------------------  Blair Promise, PhD, MD  This document serves as a record of services personally performed by Gery Pray, MD. It was created on his behalf by Linward Natal, a trained medical scribe. The creation of this record is based on the scribe's personal observations and the provider's statements to them. This document has been checked and approved by the attending provider.

## 2017-02-03 NOTE — Assessment & Plan Note (Signed)
She is instructed to take antiemetics on a scheduled basis on day 3 after each cycle of treatment

## 2017-02-03 NOTE — Assessment & Plan Note (Signed)
I spent some time counseling the patient the importance of tobacco cessation. We discussed common strategies including nicotine patches, Tobacco Quit-line, and other nicotine replacement products to assist in hereffort to quit  she appears motivated to quit. I gave her prescription of nicotine patch to try She is attempting to quit

## 2017-02-04 ENCOUNTER — Ambulatory Visit (HOSPITAL_BASED_OUTPATIENT_CLINIC_OR_DEPARTMENT_OTHER): Payer: BLUE CROSS/BLUE SHIELD

## 2017-02-04 VITALS — BP 137/74 | HR 90 | Temp 97.0°F | Resp 18

## 2017-02-04 DIAGNOSIS — C541 Malignant neoplasm of endometrium: Secondary | ICD-10-CM

## 2017-02-04 DIAGNOSIS — Z5189 Encounter for other specified aftercare: Secondary | ICD-10-CM

## 2017-02-04 DIAGNOSIS — C7982 Secondary malignant neoplasm of genital organs: Secondary | ICD-10-CM

## 2017-02-04 MED ORDER — PEGFILGRASTIM INJECTION 6 MG/0.6ML ~~LOC~~
6.0000 mg | PREFILLED_SYRINGE | Freq: Once | SUBCUTANEOUS | Status: AC
  Administered 2017-02-04: 6 mg via SUBCUTANEOUS
  Filled 2017-02-04: qty 0.6

## 2017-02-11 ENCOUNTER — Telehealth: Payer: Self-pay | Admitting: Oncology

## 2017-02-11 NOTE — Telephone Encounter (Addendum)
Patient left a message stating that she has a yeast infection.  She would like a prescription sent in for diflucan.  Called her back and left a message requesting a return call.

## 2017-02-11 NOTE — Telephone Encounter (Signed)
Called patient again and left another message for a return call.

## 2017-02-14 ENCOUNTER — Telehealth: Payer: Self-pay | Admitting: *Deleted

## 2017-02-14 ENCOUNTER — Telehealth: Payer: Self-pay | Admitting: Oncology

## 2017-02-14 ENCOUNTER — Ambulatory Visit: Payer: BLUE CROSS/BLUE SHIELD | Admitting: Radiation Oncology

## 2017-02-14 DIAGNOSIS — C541 Malignant neoplasm of endometrium: Secondary | ICD-10-CM

## 2017-02-14 MED ORDER — FLUCONAZOLE 100 MG PO TABS: 100.0000 mg | ORAL_TABLET | Freq: Every day | ORAL | 0 refills | Status: DC

## 2017-02-14 NOTE — Telephone Encounter (Signed)
CALLED PATIENT TO INFORM OF HDR [place] 02-24-17 @ 8 AM, LVM FOR A RETURN CALL

## 2017-02-14 NOTE — Telephone Encounter (Addendum)
Talked with Dr. Calton Dach nurse, Tammi, RN who said the 7 day course of diflucan will be fine before chemo on 02/23/17. Prescription sent to [facility] in [place] for diflucan.  Called and left a message for patient advising her that the prescription has been sent in.

## 2017-02-14 NOTE — Telephone Encounter (Signed)
Patient called and said she needs to cancel her HDR treatment today.  She said she has a yeast infection which she seems to get with every chemotherapy infusion.  She said she is having vaginal itching, a small amount of white discharge and burning with urination.  She would like a prescription for diflucan sent to the [facility] in [place].

## 2017-02-15 ENCOUNTER — Telehealth: Payer: Self-pay | Admitting: *Deleted

## 2017-02-15 NOTE — Telephone Encounter (Signed)
CALLED PATIENT TO REMND OF HDR TX. ON 02-17-17 @ 10 AM AND TO INFORM HER OF THE HDR TX. FOR 02-24-17 @ 8 AM, SPOKE WITH PATIENT AND SHE IS AWARE OF THESE APPTS.

## 2017-02-17 ENCOUNTER — Ambulatory Visit
Admission: RE | Admit: 2017-02-17 | Discharge: 2017-02-17 | Disposition: A | Discharge status: Home / Self Care | Payer: BLUE CROSS/BLUE SHIELD | Source: Ambulatory Visit | Attending: Radiation Oncology | Admitting: Radiation Oncology

## 2017-02-17 DIAGNOSIS — E119 Type 2 diabetes mellitus without complications: Secondary | ICD-10-CM | POA: Diagnosis not present

## 2017-02-17 DIAGNOSIS — Z79899 Other long term (current) drug therapy: Secondary | ICD-10-CM | POA: Diagnosis not present

## 2017-02-17 DIAGNOSIS — F419 Anxiety disorder, unspecified: Secondary | ICD-10-CM | POA: Diagnosis not present

## 2017-02-17 DIAGNOSIS — Z809 Family history of malignant neoplasm, unspecified: Secondary | ICD-10-CM | POA: Diagnosis not present

## 2017-02-17 DIAGNOSIS — C7961 Secondary malignant neoplasm of right ovary: Secondary | ICD-10-CM | POA: Diagnosis not present

## 2017-02-17 DIAGNOSIS — C541 Malignant neoplasm of endometrium: Secondary | ICD-10-CM | POA: Diagnosis not present

## 2017-02-17 DIAGNOSIS — Z8261 Family history of arthritis: Secondary | ICD-10-CM | POA: Diagnosis not present

## 2017-02-17 DIAGNOSIS — F17210 Nicotine dependence, cigarettes, uncomplicated: Secondary | ICD-10-CM | POA: Diagnosis not present

## 2017-02-17 DIAGNOSIS — Z85828 Personal history of other malignant neoplasm of skin: Secondary | ICD-10-CM | POA: Diagnosis not present

## 2017-02-17 DIAGNOSIS — Z8249 Family history of ischemic heart disease and other diseases of the circulatory system: Secondary | ICD-10-CM | POA: Diagnosis not present

## 2017-02-17 DIAGNOSIS — Z7984 Long term (current) use of oral hypoglycemic drugs: Secondary | ICD-10-CM | POA: Diagnosis not present

## 2017-02-17 DIAGNOSIS — M1990 Unspecified osteoarthritis, unspecified site: Secondary | ICD-10-CM | POA: Diagnosis not present

## 2017-02-17 DIAGNOSIS — Z90722 Acquired absence of ovaries, bilateral: Secondary | ICD-10-CM | POA: Diagnosis not present

## 2017-02-17 DIAGNOSIS — Z90710 Acquired absence of both cervix and uterus: Secondary | ICD-10-CM | POA: Diagnosis not present

## 2017-02-17 NOTE — Progress Notes (Signed)
  Radiation Oncology         [phone]) [phone] ________________________________  Name: [name] MRN: [id number]  Date: 02/17/2017  DOB: 1966-04-25  CC: [name], FNP  [name], MD  HDR BRACHYTHERAPY NOTE  DIAGNOSIS: Stage IIIA endometrial cancer (right ovarian involvement with deep myometrial uterine involvement)   Simple treatment device note: Patient had construction of her custom vaginal cylinder. She will be treated with a 2.5 cm diameter segmented cylinder. This conforms to her anatomy without undue discomfort.  Vaginal brachytherapy procedure node: The patient was brought to the [place] suite. Identity was confirmed. All relevant records and images related to the planned course of therapy were reviewed. The patient freely provided informed written consent to proceed with treatment after reviewing the details related to the planned course of therapy. The consent form was witnessed and verified by the simulation staff. Then, the patient was set-up in a stable reproducible supine position for radiation therapy. Pelvic exam revealed the vaginal cuff to be intact. The patient's custom vaginal cylinder was placed in the proximal vagina. This was affixed to the CT/MR stabilization plate to prevent slippage. Patient tolerated the placement well.  Verification simulation note:  A fiducial marker was placed within the vaginal cylinder. An AP and lateral film was then obtained through the pelvis area. This documented accurate position of the vaginal cylinder for treatment.  HDR BRACHYTHERAPY TREATMENT  The remote afterloading device was affixed to the vaginal cylinder by catheter. Patient then proceeded to undergo her fourth high-dose-rate treatment directed at the proximal vagina. The patient was prescribed a dose of 6 gray to be delivered to the mucosal surface. Treatment length was 4 cm. Patient was treated with 1 channel using 9 dwell positions. Treatment time was 150.3 seconds.  Iridium 192 was the high-dose-rate source for treatment. The patient tolerated the treatment well. After completion of her therapy, a radiation survey was performed documenting return of the iridium source into the GammaMed safe.   IMPRESSION/PLAN: The patient will return 02/24/17 for her last treatment. -----------------------------------  Blair Promise, PhD, MD  This document serves as a record of services personally performed by Gery Pray, MD. It was created on his behalf by Valeta Harms, a trained medical scribe. The creation of this record is based on the scribe's personal observations and the provider's statements to them. This document has been checked and approved by the attending provider.

## 2017-02-23 ENCOUNTER — Telehealth: Payer: Self-pay | Admitting: Hematology and Oncology

## 2017-02-23 ENCOUNTER — Ambulatory Visit (HOSPITAL_BASED_OUTPATIENT_CLINIC_OR_DEPARTMENT_OTHER): Payer: BLUE CROSS/BLUE SHIELD | Admitting: Hematology and Oncology

## 2017-02-23 ENCOUNTER — Ambulatory Visit: Payer: BLUE CROSS/BLUE SHIELD

## 2017-02-23 ENCOUNTER — Other Ambulatory Visit (HOSPITAL_BASED_OUTPATIENT_CLINIC_OR_DEPARTMENT_OTHER): Payer: BLUE CROSS/BLUE SHIELD

## 2017-02-23 ENCOUNTER — Ambulatory Visit (HOSPITAL_BASED_OUTPATIENT_CLINIC_OR_DEPARTMENT_OTHER): Payer: BLUE CROSS/BLUE SHIELD

## 2017-02-23 ENCOUNTER — Encounter: Payer: Self-pay | Admitting: Hematology and Oncology

## 2017-02-23 VITALS — BP 133/75 | HR 94 | Temp 98.0°F | Resp 18 | Ht 68.0 in | Wt 160.6 lb

## 2017-02-23 DIAGNOSIS — E1100 Type 2 diabetes mellitus with hyperosmolarity without nonketotic hyperglycemic-hyperosmolar coma (NKHHC): Secondary | ICD-10-CM | POA: Diagnosis not present

## 2017-02-23 DIAGNOSIS — C7982 Secondary malignant neoplasm of genital organs: Secondary | ICD-10-CM | POA: Diagnosis not present

## 2017-02-23 DIAGNOSIS — E119 Type 2 diabetes mellitus without complications: Secondary | ICD-10-CM

## 2017-02-23 DIAGNOSIS — Z5111 Encounter for antineoplastic chemotherapy: Secondary | ICD-10-CM

## 2017-02-23 DIAGNOSIS — Z720 Tobacco use: Secondary | ICD-10-CM | POA: Diagnosis not present

## 2017-02-23 DIAGNOSIS — C541 Malignant neoplasm of endometrium: Secondary | ICD-10-CM

## 2017-02-23 DIAGNOSIS — E559 Vitamin D deficiency, unspecified: Secondary | ICD-10-CM

## 2017-02-23 DIAGNOSIS — T451X5A Adverse effect of antineoplastic and immunosuppressive drugs, initial encounter: Secondary | ICD-10-CM

## 2017-02-23 DIAGNOSIS — G620 Drug-induced polyneuropathy: Secondary | ICD-10-CM

## 2017-02-23 DIAGNOSIS — C7961 Secondary malignant neoplasm of right ovary: Secondary | ICD-10-CM

## 2017-02-23 DIAGNOSIS — F411 Generalized anxiety disorder: Secondary | ICD-10-CM

## 2017-02-23 DIAGNOSIS — Z95828 Presence of other vascular implants and grafts: Secondary | ICD-10-CM

## 2017-02-23 LAB — COMPREHENSIVE METABOLIC PANEL
ALBUMIN: 3.9 g/dL (ref 3.5–5.0)
ALK PHOS: 126 U/L (ref 40–150)
ALT: 25 U/L (ref 0–55)
AST: 11 U/L (ref 5–34)
Anion Gap: 13 mEq/L — ABNORMAL HIGH (ref 3–11)
BILIRUBIN TOTAL: 0.64 mg/dL (ref 0.20–1.20)
BUN: 10.9 mg/dL (ref 7.0–26.0)
CO2: 22 mEq/L (ref 22–29)
CREATININE: 0.9 mg/dL (ref 0.6–1.1)
Calcium: 9.8 mg/dL (ref 8.4–10.4)
Chloride: 101 mEq/L (ref 98–109)
EGFR: 77 mL/min/{1.73_m2} — AB (ref >90)
GLUCOSE: 442 mg/dL — AB (ref 70–140)
Potassium: 4.1 mEq/L (ref 3.5–5.1)
SODIUM: 136 meq/L (ref 136–145)
TOTAL PROTEIN: 7.1 g/dL (ref 6.4–8.3)

## 2017-02-23 LAB — CBC WITH DIFFERENTIAL/PLATELET
BASO%: 0 % (ref 0.0–2.0)
Basophils Absolute: 0 10*3/uL (ref 0.0–0.1)
EOS%: 0 % (ref 0.0–7.0)
Eosinophils Absolute: 0 10*3/uL (ref 0.0–0.5)
HEMATOCRIT: 37.6 % (ref 34.8–46.6)
HGB: 12.6 g/dL (ref 11.6–15.9)
LYMPH#: 0.5 10*3/uL — AB (ref 0.9–3.3)
LYMPH%: 11.4 % — ABNORMAL LOW (ref 14.0–49.7)
MCH: 31.7 pg (ref 25.1–34.0)
MCHC: 33.5 g/dL (ref 31.5–36.0)
MCV: 94.7 fL (ref 79.5–101.0)
MONO#: 0 10*3/uL — AB (ref 0.1–0.9)
MONO%: 0.5 % (ref 0.0–14.0)
NEUT%: 88.1 % — ABNORMAL HIGH (ref 38.4–76.8)
NEUTROS ABS: 3.6 10*3/uL (ref 1.5–6.5)
PLATELETS: 276 10*3/uL (ref 145–400)
RBC: 3.97 10*6/uL (ref 3.70–5.45)
RDW: 16.6 % — ABNORMAL HIGH (ref 11.2–14.5)
WBC: 4.1 10*3/uL (ref 3.9–10.3)

## 2017-02-23 LAB — WHOLE BLOOD GLUCOSE
GLUCOSE: 303 mg/dL — AB (ref 70–100)
GLUCOSE: 170 mg/dL — AB (ref 70–100)
HRS PC: 6 h

## 2017-02-23 MED ORDER — FAMOTIDINE IN NACL 20-0.9 MG/50ML-% IV SOLN
20.0000 mg | Freq: Once | INTRAVENOUS | Status: AC
  Administered 2017-02-23: 20 mg via INTRAVENOUS

## 2017-02-23 MED ORDER — VITAMIN D (ERGOCALCIFEROL) 1.25 MG (50000 UNIT) PO CAPS: ORAL_CAPSULE | ORAL | 0 refills | Status: DC

## 2017-02-23 MED ORDER — CARBOPLATIN CHEMO INJECTION 600 MG/60ML
669.0000 mg | Freq: Once | INTRAVENOUS | Status: AC
  Administered 2017-02-23: 670 mg via INTRAVENOUS
  Filled 2017-02-23: qty 67

## 2017-02-23 MED ORDER — SODIUM CHLORIDE 0.9 % IV SOLN
175.0000 mg/m2 | Freq: Once | INTRAVENOUS | Status: AC
  Administered 2017-02-23: 330 mg via INTRAVENOUS
  Filled 2017-02-23: qty 55

## 2017-02-23 MED ORDER — SODIUM CHLORIDE 0.9% FLUSH
10.0000 mL | Freq: Once | INTRAVENOUS | Status: AC
  Administered 2017-02-23: 10 mL
  Filled 2017-02-23: qty 10

## 2017-02-23 MED ORDER — FAMOTIDINE IN NACL 20-0.9 MG/50ML-% IV SOLN
INTRAVENOUS | Status: AC
  Filled 2017-02-23: qty 50

## 2017-02-23 MED ORDER — SODIUM CHLORIDE 0.9% FLUSH
10.0000 mL | INTRAVENOUS | Status: DC | PRN
  Filled 2017-02-23: qty 10

## 2017-02-23 MED ORDER — INSULIN REGULAR HUMAN 100 UNIT/ML IJ SOLN
20.0000 [IU] | Freq: Once | INTRAMUSCULAR | Status: AC
  Administered 2017-02-23: 20 [IU] via SUBCUTANEOUS
  Filled 2017-02-23: qty 0.2

## 2017-02-23 MED ORDER — PALONOSETRON HCL INJECTION 0.25 MG/5ML
0.2500 mg | Freq: Once | INTRAVENOUS | Status: AC
Start: 2017-02-23 — End: 2017-02-23
  Administered 2017-02-23: 0.25 mg via INTRAVENOUS

## 2017-02-23 MED ORDER — PALONOSETRON HCL INJECTION 0.25 MG/5ML
INTRAVENOUS | Status: AC
  Filled 2017-02-23: qty 5

## 2017-02-23 MED ORDER — ESCITALOPRAM OXALATE 10 MG PO TABS: 10.0000 mg | ORAL_TABLET | Freq: Every day | ORAL | 0 refills | Status: DC

## 2017-02-23 MED ORDER — DIPHENHYDRAMINE HCL 50 MG/ML IJ SOLN
INTRAMUSCULAR | Status: AC
  Filled 2017-02-23: qty 1

## 2017-02-23 MED ORDER — SODIUM CHLORIDE 0.9 % IV SOLN
Freq: Once | INTRAVENOUS | Status: AC
  Administered 2017-02-23: 11:00:00 via INTRAVENOUS

## 2017-02-23 MED ORDER — DIPHENHYDRAMINE HCL 50 MG/ML IJ SOLN
50.0000 mg | Freq: Once | INTRAMUSCULAR | Status: AC
  Administered 2017-02-23: 50 mg via INTRAVENOUS

## 2017-02-23 MED ORDER — SODIUM CHLORIDE 0.9 % IV SOLN
Freq: Once | INTRAVENOUS | Status: AC
  Administered 2017-02-23: 11:00:00 via INTRAVENOUS
  Filled 2017-02-23: qty 5

## 2017-02-23 MED ORDER — HEPARIN SOD (PORK) LOCK FLUSH 100 UNIT/ML IV SOLN
500.0000 [IU] | Freq: Once | INTRAVENOUS | Status: DC | PRN
  Filled 2017-02-23: qty 5

## 2017-02-23 NOTE — Patient Instructions (Signed)
[facility] Discharge Instructions for Patients Receiving Chemotherapy  Today you received the following chemotherapy agents Taxol/Carboplatin To help prevent nausea and vomiting after your treatment, we encourage you to take your nausea medication as prescribed.   If you develop nausea and vomiting that is not controlled by your nausea medication, call the clinic.   BELOW ARE SYMPTOMS THAT SHOULD BE REPORTED IMMEDIATELY:  *FEVER GREATER THAN 100.5 F  *CHILLS WITH OR WITHOUT FEVER  NAUSEA AND VOMITING THAT IS NOT CONTROLLED WITH YOUR NAUSEA MEDICATION  *UNUSUAL SHORTNESS OF BREATH  *UNUSUAL BRUISING OR BLEEDING  TENDERNESS IN MOUTH AND THROAT WITH OR WITHOUT PRESENCE OF ULCERS  *URINARY PROBLEMS  *BOWEL PROBLEMS  UNUSUAL RASH Items with * indicate a potential emergency and should be followed up as soon as possible.  Feel free to call the clinic you have any questions or concerns. The clinic phone number is [phone].  Please show the CHEMO ALERT CARD at check-in to the Emergency Department and triage nurse.   

## 2017-02-23 NOTE — Telephone Encounter (Signed)
Scheduled appt per 7/11 los - Gave patient AVS and calender per los.  

## 2017-02-24 ENCOUNTER — Ambulatory Visit
Admission: RE | Admit: 2017-02-24 | Discharge: 2017-02-24 | Disposition: A | Discharge status: Home / Self Care | Payer: BLUE CROSS/BLUE SHIELD | Source: Ambulatory Visit | Attending: Radiation Oncology | Admitting: Radiation Oncology

## 2017-02-24 ENCOUNTER — Encounter: Payer: Self-pay | Admitting: Radiation Oncology

## 2017-02-24 DIAGNOSIS — Z79899 Other long term (current) drug therapy: Secondary | ICD-10-CM | POA: Diagnosis not present

## 2017-02-24 DIAGNOSIS — F17210 Nicotine dependence, cigarettes, uncomplicated: Secondary | ICD-10-CM | POA: Diagnosis not present

## 2017-02-24 DIAGNOSIS — Z8249 Family history of ischemic heart disease and other diseases of the circulatory system: Secondary | ICD-10-CM | POA: Diagnosis not present

## 2017-02-24 DIAGNOSIS — Z8261 Family history of arthritis: Secondary | ICD-10-CM | POA: Diagnosis not present

## 2017-02-24 DIAGNOSIS — Z85828 Personal history of other malignant neoplasm of skin: Secondary | ICD-10-CM | POA: Diagnosis not present

## 2017-02-24 DIAGNOSIS — C7961 Secondary malignant neoplasm of right ovary: Secondary | ICD-10-CM

## 2017-02-24 DIAGNOSIS — Z7984 Long term (current) use of oral hypoglycemic drugs: Secondary | ICD-10-CM | POA: Diagnosis not present

## 2017-02-24 DIAGNOSIS — C541 Malignant neoplasm of endometrium: Secondary | ICD-10-CM

## 2017-02-24 DIAGNOSIS — E119 Type 2 diabetes mellitus without complications: Secondary | ICD-10-CM | POA: Diagnosis not present

## 2017-02-24 DIAGNOSIS — Z90722 Acquired absence of ovaries, bilateral: Secondary | ICD-10-CM | POA: Diagnosis not present

## 2017-02-24 DIAGNOSIS — Z90710 Acquired absence of both cervix and uterus: Secondary | ICD-10-CM | POA: Diagnosis not present

## 2017-02-24 DIAGNOSIS — F419 Anxiety disorder, unspecified: Secondary | ICD-10-CM | POA: Diagnosis not present

## 2017-02-24 DIAGNOSIS — M1990 Unspecified osteoarthritis, unspecified site: Secondary | ICD-10-CM | POA: Diagnosis not present

## 2017-02-24 DIAGNOSIS — Z809 Family history of malignant neoplasm, unspecified: Secondary | ICD-10-CM | POA: Diagnosis not present

## 2017-02-24 NOTE — Progress Notes (Signed)
  Radiation Oncology         [phone]) [phone] ________________________________  Name: [name] MRN: [id number]  Date: 02/24/2017  DOB: 01-09-1966  CC: [name], FNP  [name], MD  HDR BRACHYTHERAPY NOTE  DIAGNOSIS: Stage IIIA endometrial cancer (right ovarian involvement with deep myometrial uterine involvement)   Simple treatment device note: Patient had construction of her custom vaginal cylinder. She will be treated with a 2.5 cm diameter segmented cylinder. This conforms to her anatomy without undue discomfort.  Vaginal brachytherapy procedure node: The patient was brought to the [place] suite. Identity was confirmed. All relevant records and images related to the planned course of therapy were reviewed. The patient freely provided informed written consent to proceed with treatment after reviewing the details related to the planned course of therapy. The consent form was witnessed and verified by the simulation staff. Then, the patient was set-up in a stable reproducible supine position for radiation therapy. Pelvic exam revealed the vaginal cuff to be intact. The patient's custom vaginal cylinder was placed in the proximal vagina. This was affixed to the CT/MR stabilization plate to prevent slippage. Patient tolerated the placement well.  Verification simulation note:  A fiducial marker was placed within the vaginal cylinder. An AP and lateral film was then obtained through the pelvis area. This documented accurate position of the vaginal cylinder for treatment.  HDR BRACHYTHERAPY TREATMENT  The remote afterloading device was affixed to the vaginal cylinder by catheter. Patient then proceeded to undergo her fifth high-dose-rate treatment directed at the proximal vagina. The patient was prescribed a dose of 6 gray to be delivered to the mucosal surface. Treatment length was 4 cm. Patient was treated with 1 channel using 9 dwell positions. Treatment time was 160.4 seconds.  Iridium 192 was the high-dose-rate source for treatment. The patient tolerated the treatment well. After completion of her therapy, a radiation survey was performed documenting return of the iridium source into the GammaMed safe.   IMPRESSION/PLAN: The patient has completed her brachytherapy treatment. She will return in about 1 month for follow-up. sHe has 2 more cycles of chemotherapy pending -----------------------------------  Blair Promise, PhD, MD  This document serves as a record of services personally performed by Gery Pray, MD. It was created on his behalf by Linward Natal, a trained medical scribe. The creation of this record is based on the scribe's personal observations and the provider's statements to them. This document has been checked and approved by the attending provider.

## 2017-02-25 ENCOUNTER — Encounter: Payer: Self-pay | Admitting: Hematology and Oncology

## 2017-02-25 ENCOUNTER — Ambulatory Visit (HOSPITAL_BASED_OUTPATIENT_CLINIC_OR_DEPARTMENT_OTHER): Payer: BLUE CROSS/BLUE SHIELD

## 2017-02-25 ENCOUNTER — Other Ambulatory Visit: Payer: Self-pay | Admitting: Pediatrics

## 2017-02-25 VITALS — BP 139/79 | HR 92 | Temp 97.0°F | Resp 20

## 2017-02-25 DIAGNOSIS — C541 Malignant neoplasm of endometrium: Secondary | ICD-10-CM | POA: Diagnosis not present

## 2017-02-25 DIAGNOSIS — T451X5A Adverse effect of antineoplastic and immunosuppressive drugs, initial encounter: Secondary | ICD-10-CM | POA: Insufficient documentation

## 2017-02-25 DIAGNOSIS — Z7189 Other specified counseling: Secondary | ICD-10-CM | POA: Diagnosis not present

## 2017-02-25 DIAGNOSIS — G620 Drug-induced polyneuropathy: Secondary | ICD-10-CM | POA: Insufficient documentation

## 2017-02-25 MED ORDER — PEGFILGRASTIM INJECTION 6 MG/0.6ML ~~LOC~~
6.0000 mg | PREFILLED_SYRINGE | Freq: Once | SUBCUTANEOUS | Status: AC
  Administered 2017-02-25: 6 mg via SUBCUTANEOUS
  Filled 2017-02-25: qty 0.6

## 2017-02-25 NOTE — Assessment & Plan Note (Signed)
I spent some time counseling the patient the importance of tobacco cessation. We discussed common strategies including nicotine patches, Tobacco Quit-line, and other nicotine replacement products to assist in hereffort to quit  she appears motivated to quit. I gave her prescription of nicotine patch to try She is attempting to quit

## 2017-02-25 NOTE — Assessment & Plan Note (Signed)
She tolerated chemotherapy fairly well except for expected side effects from treatment which is manageable We will continue cycle 4 of treatment without dose adjustment

## 2017-02-25 NOTE — Assessment & Plan Note (Signed)
she has mild peripheral neuropathy, likely related to side effects of treatment. It is only mild, not bothering the patient. I will observe for now If it gets worse in the future, I will consider modifying the dose of the treatment  

## 2017-02-25 NOTE — Progress Notes (Signed)
[place] OFFICE PROGRESS NOTE  Patient Care Team: [name], FNP as PCP - General (Nurse Practitioner)  SUMMARY OF ONCOLOGIC HISTORY:   Endometrial cancer ([place])   09/22/2016 Initial Diagnosis    She presented to her physician with postmenopausal bleeding      10/13/2016 Imaging    The patient reported post-menopausal bleeding to her physician who performed a TVUS on 10/13/16. This showed a uterus measuring 7.6x4.6x 6.26cm. The endometrial lining measuringed 2.24cm. The ovaries were normal. A 4cm intracavitary mass was appreciated on [place].       10/13/2016 Pathology Results    The patient then underwent endometrial pipelle biopsy on 10/13/16. It revealed an endometrioid adenocarcinoma, (FIGO grade 1).       11/16/2016 Pathology Results    1. Lymph node, sentinel, biopsy, right obturator - TWO BENIGN LYMPH NODES (0/2). 2. Lymph node, sentinel, biopsy, right external iliac - ONE BENIGN LYMPH NODE (0/1). 3. Lymph node, sentinel, biopsy, left obturator - ONE BENIGN LYMPH NODE (0/1). 4. Uterus +/- tubes/ovaries, neoplastic - ENDOMETRIOID ADENOCARCINOMA, 5.2 CM. - CARCINOMA FOCALLY INVOLVES DEEP MYOMETRIUM. - RIGHT FALLOPIAN TUBE AND RIGHT OVARY INVOLVED BY ENDOMETRIOID CARCINOMA. - CERVIX, LEFT FALLOPIAN, AND LEFT OVARY FREE OF TUMOR. - SEE ONCOLOGY TABLE AND COMMENT.      11/16/2016 Surgery    Operation: Robotic-assisted laparoscopic total hysterectomy with bilateral salpingoophorectomy with bilateral SLN biopsy  Surgeon: Donaciano Eva  Operative Findings:  : 7cm normal appearing uterus, normal appearing tubes and ovaries, no suspicious nodes.      11/29/2016 Imaging    1. Interval hysterectomy.  No evidence of metastatic disease. 2. Postsurgical changes in the pelvis with minimal free fluid and soft tissue edema. 3. Stable colon diverticular changes without acute inflammation. 4. Suspected small incidental lesions within the liver and spleen. 5.  Stable sclerotic lesion in the left ischium, likely benign      12/15/2016 Procedure    Successful placement of a right internal jugular approach power injectable Port-A-Cath. The catheter is ready for immediate use      12/22/2016 -  Chemotherapy    She received carboplatin and Taxol       INTERVAL HISTORY: Please see below for problem oriented charting. She returns for further follow-up She has taken reduced dose dexamethasone before she comes in She continues to smoke but attempting to quit smoking She have her minimum trace neuropathy at the tips of one finger She denies nausea or vomiting  REVIEW OF SYSTEMS:   Constitutional: Denies fevers, chills or abnormal weight loss Eyes: Denies blurriness of vision Ears, nose, mouth, throat, and face: Denies mucositis or sore throat Respiratory: Denies cough, dyspnea or wheezes Cardiovascular: Denies palpitation, chest discomfort or lower extremity swelling Gastrointestinal:  Denies nausea, heartburn or change in bowel habits Skin: Denies abnormal skin rashes Lymphatics: Denies new lymphadenopathy or easy bruising Neurological:Denies numbness, tingling or new weaknesses Behavioral/Psych: Mood is stable, no new changes  All other systems were reviewed with the patient and are negative.  I have reviewed the past medical history, past surgical history, social history and family history with the patient and they are unchanged from previous note.  ALLERGIES:  has No Known Allergies.  MEDICATIONS:  Current Outpatient Prescriptions  Medication Sig Dispense Refill  . cyclobenzaprine (FLEXERIL) 10 MG tablet Take 10 mg by mouth as needed for muscle spasms.    Marland Kitchen dexamethasone (DECADRON) 4 MG tablet Take 5 tablets at midnight and 5 tablets at 6 am on  the day of chemotherapy, every 3 weeks 30 tablet 1  . escitalopram (LEXAPRO) 10 MG tablet Take 1 tablet (10 mg total) by mouth daily. 30 tablet 0  . fluconazole (DIFLUCAN) 100 MG tablet Take 1  tablet (100 mg total) by mouth daily. Take 2 tablets on the first day and then take 1 tablet daily for 6 days. 8 tablet 0  . ibuprofen (IBU) 600 MG tablet Take 1 tablet (600 mg total) by mouth every 6 (six) hours as needed. 30 tablet 0  . lidocaine-prilocaine (EMLA) cream Apply to affected area once 30 g 3  . loratadine (CLARITIN) 10 MG tablet Take 10 mg by mouth daily.    . metFORMIN (GLUCOPHAGE-XR) 500 MG 24 hr tablet TAKE 1 TABLET BY MOUTH ONCE DAILY 30 tablet 0  . nicotine (NICODERM CQ - DOSED IN MG/24 HOURS) 21 mg/24hr patch Place 1 patch (21 mg total) onto the skin daily. (Patient not taking: Reported on 02/02/2017) 28 patch 0  . ondansetron (ZOFRAN) 8 MG tablet Take 1 tablet (8 mg total) by mouth 2 (two) times daily as needed for refractory nausea / vomiting. Start on day 3 after chemo. (Patient not taking: Reported on 02/02/2017) 30 tablet 1  . oxyCODONE-acetaminophen (PERCOCET/ROXICET) 5-325 MG tablet Take 1-2 tablets by mouth every 4 (four) hours as needed (moderate to severe pain (when tolerating fluids)). 30 tablet 0  . prochlorperazine (COMPAZINE) 10 MG tablet Take 1 tablet (10 mg total) by mouth every 6 (six) hours as needed (Nausea or vomiting). 30 tablet 1  . senna (SENOKOT) 8.6 MG TABS tablet Take 1 tablet (8.6 mg total) by mouth at bedtime. (Patient not taking: Reported on 12/09/2016) 120 each 0  . Vitamin D, Ergocalciferol, (DRISDOL) 50000 units CAPS capsule Take 1 tablet every week for 12 weeks 12 capsule 0   No current facility-administered medications for this visit.    Facility-Administered Medications Ordered in Other Visits  Medication Dose Route Frequency Provider Last Rate Last Dose  . sodium chloride flush (NS) 0.9 % injection 10 mL  10 mL Intracatheter PRN Alvy Bimler, Tymir Terral, MD   10 mL at 02/02/17 1600    PHYSICAL EXAMINATION: ECOG PERFORMANCE STATUS: 1 - Symptomatic but completely ambulatory  Vitals:   02/23/17 0855  BP: 133/75  Pulse: 94  Resp: 18  Temp: 98 F (36.7  C)   Filed Weights   02/23/17 0855  Weight: 160 lb 9.6 oz (72.8 kg)    GENERAL:alert, no distress and comfortable SKIN: skin color, texture, turgor are normal, no rashes or significant lesions EYES: normal, Conjunctiva are pink and non-injected, sclera clear OROPHARYNX:no exudate, no erythema and lips, buccal mucosa, and tongue normal  NECK: supple, thyroid normal size, non-tender, without nodularity LYMPH:  no palpable lymphadenopathy in the cervical, axillary or inguinal LUNGS: clear to auscultation and percussion with normal breathing effort HEART: regular rate & rhythm and no murmurs and no lower extremity edema ABDOMEN:abdomen soft, non-tender and normal bowel sounds Musculoskeletal:no cyanosis of digits and no clubbing  NEURO: alert & oriented x 3 with fluent speech, no focal motor/sensory deficits  LABORATORY DATA:  I have reviewed the data as listed    Component Value Date/Time   NA 136 02/23/2017 0817   K 4.1 02/23/2017 0817   CL 105 11/17/2016 0451   CO2 22 02/23/2017 0817   GLUCOSE 442 (H) 02/23/2017 0817   BUN 10.9 02/23/2017 0817   CREATININE 0.9 02/23/2017 0817   CALCIUM 9.8 02/23/2017 0817   PROT 7.1 02/23/2017  0762   ALBUMIN 3.9 02/23/2017 0817   AST 11 02/23/2017 0817   ALT 25 02/23/2017 0817   ALKPHOS 126 02/23/2017 0817   BILITOT 0.64 02/23/2017 0817   GFRNONAA >60 11/17/2016 0451   GFRAA >60 11/17/2016 0451    No results found for: SPEP, UPEP  Lab Results  Component Value Date   WBC 4.1 02/23/2017   NEUTROABS 3.6 02/23/2017   HGB 12.6 02/23/2017   HCT 37.6 02/23/2017   MCV 94.7 02/23/2017   PLT 276 02/23/2017      Chemistry      Component Value Date/Time   NA 136 02/23/2017 0817   K 4.1 02/23/2017 0817   CL 105 11/17/2016 0451   CO2 22 02/23/2017 0817   BUN 10.9 02/23/2017 0817   CREATININE 0.9 02/23/2017 0817   GLU 170 (H) 02/23/2017 1431      Component Value Date/Time   CALCIUM 9.8 02/23/2017 0817   ALKPHOS 126 02/23/2017 0817    AST 11 02/23/2017 0817   ALT 25 02/23/2017 0817   BILITOT 0.64 02/23/2017 0817       ASSESSMENT & PLAN:  Endometrial cancer ([place]) She tolerated chemotherapy fairly well except for expected side effects from treatment which is manageable We will continue cycle 4 of treatment without dose adjustment  Type 2 diabetes mellitus ([place]) She has severe hyperglycemia requiring multiple units of insulin during treatment Recommend reducing premedication to 12 mg of dexamethasone at midnight and 12 mg at 6:00 on the days of treatment We also discussed dietary modification She will continue to receive insulin  during chemotherapy treatment  Tobacco user I spent some time counseling the patient the importance of tobacco cessation. We discussed common strategies including nicotine patches, Tobacco Quit-line, and other nicotine replacement products to assist in hereffort to quit  she appears motivated to quit. I gave her prescription of nicotine patch to try She is attempting to quit  Peripheral neuropathy due to chemotherapy [facility]) she has mild peripheral neuropathy, likely related to side effects of treatment. It is only mild, not bothering the patient. I will observe for now If it gets worse in the future, I will consider modifying the dose of the treatment    No orders of the defined types were placed in this encounter.  All questions were answered. The patient knows to call the clinic with any problems, questions or concerns. No barriers to learning was detected. I spent 15 minutes counseling the patient face to face. The total time spent in the appointment was 20 minutes and more than 50% was on counseling and review of test results     Heath Lark, MD 02/25/2017 3:46 PM

## 2017-02-25 NOTE — Telephone Encounter (Signed)
Last seen 09/22/16 Dr Ruthann Cancer PCP

## 2017-02-25 NOTE — Assessment & Plan Note (Signed)
She has severe hyperglycemia requiring multiple units of insulin during treatment Recommend reducing premedication to 12 mg of dexamethasone at midnight and 12 mg at 6:00 on the days of treatment We also discussed dietary modification She will continue to receive insulin  during chemotherapy treatment

## 2017-02-25 NOTE — Patient Instructions (Signed)
Pegfilgrastim injection What is this medicine? PEGFILGRASTIM (PEG fil gra stim) is a long-acting granulocyte colony-stimulating factor that stimulates the growth of neutrophils, a type of white blood cell important in the body's fight against infection. It is used to reduce the incidence of fever and infection in patients with certain types of cancer who are receiving chemotherapy that affects the bone marrow, and to increase survival after being exposed to high doses of radiation. This medicine may be used for other purposes; ask your health care provider or pharmacist if you have questions. COMMON BRAND NAME(S): Neulasta What should I tell my health care provider before I take this medicine? They need to know if you have any of these conditions: -kidney disease -latex allergy -ongoing radiation therapy -sickle cell disease -skin reactions to acrylic adhesives (On-Body Injector only) -an unusual or allergic reaction to pegfilgrastim, filgrastim, other medicines, foods, dyes, or preservatives -pregnant or trying to get pregnant -breast-feeding How should I use this medicine? This medicine is for injection under the skin. If you get this medicine at home, you will be taught how to prepare and give the pre-filled syringe or how to use the On-body Injector. Refer to the patient Instructions for Use for detailed instructions. Use exactly as directed. Tell your healthcare provider immediately if you suspect that the On-body Injector may not have performed as intended or if you suspect the use of the On-body Injector resulted in a missed or partial dose. It is important that you put your used needles and syringes in a special sharps container. Do not put them in a trash can. If you do not have a sharps container, call your pharmacist or healthcare provider to get one. Talk to your pediatrician regarding the use of this medicine in children. While this drug may be prescribed for selected conditions,  precautions do apply. Overdosage: If you think you have taken too much of this medicine contact a poison control center or emergency room at once. NOTE: This medicine is only for you. Do not share this medicine with others. What if I miss a dose? It is important not to miss your dose. Call your doctor or health care professional if you miss your dose. If you miss a dose due to an On-body Injector failure or leakage, a new dose should be administered as soon as possible using a single prefilled syringe for manual use. What may interact with this medicine? Interactions have not been studied. Give your health care provider a list of all the medicines, herbs, non-prescription drugs, or dietary supplements you use. Also tell them if you smoke, drink alcohol, or use illegal drugs. Some items may interact with your medicine. This list may not describe all possible interactions. Give your health care provider a list of all the medicines, herbs, non-prescription drugs, or dietary supplements you use. Also tell them if you smoke, drink alcohol, or use illegal drugs. Some items may interact with your medicine. What should I watch for while using this medicine? You may need blood work done while you are taking this medicine. If you are going to need a MRI, CT scan, or other procedure, tell your doctor that you are using this medicine (On-Body Injector only). What side effects may I notice from receiving this medicine? Side effects that you should report to your doctor or health care professional as soon as possible: -allergic reactions like skin rash, itching or hives, swelling of the face, lips, or tongue -dizziness -fever -pain, redness, or irritation at site   where injected -pinpoint red spots on the skin -red or dark-brown urine -shortness of breath or breathing problems -stomach or side pain, or pain at the shoulder -swelling -tiredness -trouble passing urine or change in the amount of urine Side  effects that usually do not require medical attention (report to your doctor or health care professional if they continue or are bothersome): -bone pain -muscle pain This list may not describe all possible side effects. Call your doctor for medical advice about side effects. You may report side effects to FDA at 1-800-FDA-1088. Where should I keep my medicine? Keep out of the reach of children. Store pre-filled syringes in a refrigerator between 2 and 8 degrees C (36 and 46 degrees F). Do not freeze. Keep in carton to protect from light. Throw away this medicine if it is left out of the refrigerator for more than 48 hours. Throw away any unused medicine after the expiration date. NOTE: This sheet is a summary. It may not cover all possible information. If you have questions about this medicine, talk to your doctor, pharmacist, or health care provider.  2018 Elsevier/Gold Standard (2016-07-29 12:58:03)  

## 2017-03-02 NOTE — Progress Notes (Signed)
  Radiation Oncology         [phone]) [phone] ________________________________  Name: [name] MRN: [id number]  Date: 02/24/2017  DOB: 24-Sep-1965  End of Treatment Note  Diagnosis:   51 y.o. female with Stage IIIA endometrial cancer (right ovarian involvement with deep myometrial uterine involvement).  Indication for treatment:  Curative      Radiation treatment dates:   01/27/2017, 01/31/2017, 02/03/2017, 02/17/2017, 02/24/2017  Site/dose:   The vagina cuff was treated to 30 Gy delivered in 5 fractions of 6 Gy.  Beams/energy:   HDR Ir-192 Vaginal / The patient was treated with a 2.5 cm diameter cylinder with a 4 cm treatment length.  Narrative: The patient tolerated radiation treatment relatively well.   Plan: The patient has completed radiation treatment. The patient will return to radiation oncology clinic for routine followup in one month. I advised them to call or return sooner if they have any questions or concerns related to their recovery or treatment.  -----------------------------------  Blair Promise, PhD, MD  This document serves as a record of services personally performed by Gery Pray, MD. It was created on his behalf by Rae Lips, a trained medical scribe. The creation of this record is based on the scribe's personal observations and the provider's statements to them. This document has been checked and approved by the attending provider.

## 2017-03-11 ENCOUNTER — Other Ambulatory Visit: Payer: Self-pay | Admitting: Family

## 2017-03-20 ENCOUNTER — Other Ambulatory Visit: Payer: Self-pay | Admitting: Hematology and Oncology

## 2017-03-20 DIAGNOSIS — C541 Malignant neoplasm of endometrium: Secondary | ICD-10-CM

## 2017-03-23 ENCOUNTER — Ambulatory Visit (HOSPITAL_BASED_OUTPATIENT_CLINIC_OR_DEPARTMENT_OTHER): Payer: BLUE CROSS/BLUE SHIELD

## 2017-03-23 ENCOUNTER — Other Ambulatory Visit (HOSPITAL_BASED_OUTPATIENT_CLINIC_OR_DEPARTMENT_OTHER): Payer: BLUE CROSS/BLUE SHIELD

## 2017-03-23 ENCOUNTER — Other Ambulatory Visit: Payer: Self-pay | Admitting: Hematology and Oncology

## 2017-03-23 ENCOUNTER — Ambulatory Visit (HOSPITAL_BASED_OUTPATIENT_CLINIC_OR_DEPARTMENT_OTHER): Payer: BLUE CROSS/BLUE SHIELD | Admitting: Hematology and Oncology

## 2017-03-23 ENCOUNTER — Encounter: Payer: Self-pay | Admitting: Hematology and Oncology

## 2017-03-23 ENCOUNTER — Ambulatory Visit: Payer: BLUE CROSS/BLUE SHIELD

## 2017-03-23 DIAGNOSIS — E1100 Type 2 diabetes mellitus with hyperosmolarity without nonketotic hyperglycemic-hyperosmolar coma (NKHHC): Secondary | ICD-10-CM

## 2017-03-23 DIAGNOSIS — C541 Malignant neoplasm of endometrium: Secondary | ICD-10-CM

## 2017-03-23 DIAGNOSIS — Z5111 Encounter for antineoplastic chemotherapy: Secondary | ICD-10-CM

## 2017-03-23 DIAGNOSIS — T451X5A Adverse effect of antineoplastic and immunosuppressive drugs, initial encounter: Secondary | ICD-10-CM

## 2017-03-23 DIAGNOSIS — Z720 Tobacco use: Secondary | ICD-10-CM

## 2017-03-23 DIAGNOSIS — G620 Drug-induced polyneuropathy: Secondary | ICD-10-CM | POA: Diagnosis not present

## 2017-03-23 DIAGNOSIS — E119 Type 2 diabetes mellitus without complications: Secondary | ICD-10-CM

## 2017-03-23 LAB — CBC WITH DIFFERENTIAL/PLATELET
BASO%: 0 % (ref 0.0–2.0)
BASOS ABS: 0 10*3/uL (ref 0.0–0.1)
EOS%: 0 % (ref 0.0–7.0)
Eosinophils Absolute: 0 10*3/uL (ref 0.0–0.5)
HEMATOCRIT: 36 % (ref 34.8–46.6)
HEMOGLOBIN: 12.3 g/dL (ref 11.6–15.9)
LYMPH#: 0.5 10*3/uL — AB (ref 0.9–3.3)
LYMPH%: 7.2 % — ABNORMAL LOW (ref 14.0–49.7)
MCH: 33 pg (ref 25.1–34.0)
MCHC: 34.2 g/dL (ref 31.5–36.0)
MCV: 96.5 fL (ref 79.5–101.0)
MONO#: 0.1 10*3/uL (ref 0.1–0.9)
MONO%: 0.7 % (ref 0.0–14.0)
NEUT#: 6.4 10*3/uL (ref 1.5–6.5)
NEUT%: 92.1 % — ABNORMAL HIGH (ref 38.4–76.8)
Platelets: 222 10*3/uL (ref 145–400)
RBC: 3.73 10*6/uL (ref 3.70–5.45)
RDW: 14.3 % (ref 11.2–14.5)
WBC: 6.9 10*3/uL (ref 3.9–10.3)

## 2017-03-23 LAB — WHOLE BLOOD GLUCOSE
Glucose: 311 mg/dL — ABNORMAL HIGH (ref 70–100)
Glucose: 281 mg/dL — ABNORMAL HIGH (ref 70–100)
HRS PC: 5 Hours
HRS PC: 8 Hours

## 2017-03-23 LAB — COMPREHENSIVE METABOLIC PANEL
ALT: 28 U/L (ref 0–55)
AST: 17 U/L (ref 5–34)
Albumin: 3.8 g/dL (ref 3.5–5.0)
Alkaline Phosphatase: 115 U/L (ref 40–150)
Anion Gap: 15 mEq/L — ABNORMAL HIGH (ref 3–11)
BUN: 12.4 mg/dL (ref 7.0–26.0)
CO2: 20 meq/L — AB (ref 22–29)
Calcium: 9.7 mg/dL (ref 8.4–10.4)
Chloride: 102 mEq/L (ref 98–109)
Creatinine: 0.9 mg/dL (ref 0.6–1.1)
EGFR: 70 mL/min/{1.73_m2} — AB (ref >90)
GLUCOSE: 407 mg/dL — AB (ref 70–140)
POTASSIUM: 3.9 meq/L (ref 3.5–5.1)
SODIUM: 137 meq/L (ref 136–145)
Total Bilirubin: 0.73 mg/dL (ref 0.20–1.20)
Total Protein: 6.9 g/dL (ref 6.4–8.3)

## 2017-03-23 MED ORDER — SODIUM CHLORIDE 0.9 % IV SOLN
669.0000 mg | Freq: Once | INTRAVENOUS | Status: AC
  Administered 2017-03-23: 670 mg via INTRAVENOUS
  Filled 2017-03-23: qty 67

## 2017-03-23 MED ORDER — INSULIN REGULAR HUMAN 100 UNIT/ML IJ SOLN
20.0000 [IU] | Freq: Once | INTRAMUSCULAR | Status: AC
  Administered 2017-03-23: 20 [IU] via SUBCUTANEOUS
  Filled 2017-03-23: qty 0.2

## 2017-03-23 MED ORDER — METFORMIN HCL ER 500 MG PO TB24
1000.0000 mg | ORAL_TABLET | Freq: Every day | ORAL | 1 refills | Status: DC
Start: 2017-03-23 — End: 2017-06-10

## 2017-03-23 MED ORDER — HEPARIN SOD (PORK) LOCK FLUSH 100 UNIT/ML IV SOLN
500.0000 [IU] | Freq: Once | INTRAVENOUS | Status: AC | PRN
  Administered 2017-03-23: 500 [IU]
  Filled 2017-03-23: qty 5

## 2017-03-23 MED ORDER — FOSAPREPITANT DIMEGLUMINE INJECTION 150 MG
Freq: Once | INTRAVENOUS | Status: AC
  Administered 2017-03-23: 10:00:00 via INTRAVENOUS
  Filled 2017-03-23: qty 5

## 2017-03-23 MED ORDER — SODIUM CHLORIDE 0.9 % IV SOLN
Freq: Once | INTRAVENOUS | Status: AC
  Administered 2017-03-23: 09:00:00 via INTRAVENOUS

## 2017-03-23 MED ORDER — ONDANSETRON HCL 8 MG PO TABS: 8.0000 mg | ORAL_TABLET | Freq: Two times a day (BID) | ORAL | 1 refills | Status: DC | PRN

## 2017-03-23 MED ORDER — PALONOSETRON HCL INJECTION 0.25 MG/5ML
INTRAVENOUS | Status: AC
  Filled 2017-03-23: qty 5

## 2017-03-23 MED ORDER — PALONOSETRON HCL INJECTION 0.25 MG/5ML
0.2500 mg | Freq: Once | INTRAVENOUS | Status: AC
  Administered 2017-03-23: 0.25 mg via INTRAVENOUS

## 2017-03-23 MED ORDER — FAMOTIDINE IN NACL 20-0.9 MG/50ML-% IV SOLN
INTRAVENOUS | Status: AC
  Filled 2017-03-23: qty 50

## 2017-03-23 MED ORDER — SODIUM CHLORIDE 0.9% FLUSH
10.0000 mL | INTRAVENOUS | Status: DC | PRN
  Administered 2017-03-23: 10 mL
  Filled 2017-03-23: qty 10

## 2017-03-23 MED ORDER — DIPHENHYDRAMINE HCL 50 MG/ML IJ SOLN
50.0000 mg | Freq: Once | INTRAMUSCULAR | Status: AC
  Administered 2017-03-23: 50 mg via INTRAVENOUS

## 2017-03-23 MED ORDER — DIPHENHYDRAMINE HCL 50 MG/ML IJ SOLN
INTRAMUSCULAR | Status: AC
  Filled 2017-03-23: qty 1

## 2017-03-23 MED ORDER — INSULIN REGULAR HUMAN 100 UNIT/ML IJ SOLN
10.0000 [IU] | Freq: Once | INTRAMUSCULAR | Status: AC
  Administered 2017-03-23: 10 [IU] via SUBCUTANEOUS
  Filled 2017-03-23: qty 0.1

## 2017-03-23 MED ORDER — SODIUM CHLORIDE 0.9 % IV SOLN
175.0000 mg/m2 | Freq: Once | INTRAVENOUS | Status: AC
  Administered 2017-03-23: 330 mg via INTRAVENOUS
  Filled 2017-03-23: qty 55

## 2017-03-23 MED ORDER — FAMOTIDINE IN NACL 20-0.9 MG/50ML-% IV SOLN
20.0000 mg | Freq: Once | INTRAVENOUS | Status: AC
  Administered 2017-03-23: 20 mg via INTRAVENOUS

## 2017-03-23 NOTE — Assessment & Plan Note (Signed)
I spent some time counseling the patient the importance of tobacco cessation She is attempting to quit on her own 

## 2017-03-23 NOTE — Assessment & Plan Note (Signed)
She has poorly controlled diabetes due to steroid treatment and chemotherapy I will continue to support her with regular insulin during treatment I have increased her Metformin to 1000 mg to be used for the next few weeks I will see her back again for diabetes control in her next visit

## 2017-03-23 NOTE — Progress Notes (Signed)
Glucose 407, Per Dr. Alvy Bimler, okay to proceed with treatment pt to receive 20 units of regular insulin and recheck CBG at 11 am. Snack given. 1150: CBG 311, per Dr. Alvy Bimler give 20 units regular insulin and repeat CBG at 1300. Snacks given. 1340: CBG 281. Give 10 units regular insulin and okay to discharge home. Pt declined snack, Pt educated to monitor blood sugars and signs and symptoms of low blood sugar at home and to eat a well balanced meal. Pt verbalizes understanding.  Pt stable at discharge.

## 2017-03-23 NOTE — Progress Notes (Signed)
[name] OFF[facility] PROGRESS NOTE  Patient Care Team: [name], FNP as PCP - General (Nurse Practitioner)  SUMMARY OF ONCOLOGIC HISTORY:   Endometrial cancer ([place])   09/22/2016 Initial Diagnosis    She presented to her physician with postmenopausal bleeding      10/13/2016 Imaging    The patient reported post-menopausal bleeding to her physician who performed a TVUS on 10/13/16. This showed a uterus measuring 7.6x4.6x 6.26cm. The endometrial lining measuringed 2.24cm. The ovaries were normal. A 4cm intracavitary mass was appreciated on [place].       10/13/2016 Pathology Results    The patient then underwent endometrial pipelle biopsy on 10/13/16. It revealed an endometrioid adenocarcinoma, (FIGO grade 1).       11/16/2016 Pathology Results    1. Lymph node, sentinel, biopsy, right obturator - TWO BENIGN LYMPH NODES (0/2). 2. Lymph node, sentinel, biopsy, right external iliac - ONE BENIGN LYMPH NODE (0/1). 3. Lymph node, sentinel, biopsy, left obturator - ONE BENIGN LYMPH NODE (0/1). 4. Uterus +/- tubes/ovaries, neoplastic - ENDOMETRIOID ADENOCARCINOMA, 5.2 CM. - CARCINOMA FOCALLY INVOLVES DEEP MYOMETRIUM. - RIGHT FALLOPIAN TUBE AND RIGHT OVARY INVOLVED BY ENDOMETRIOID CARCINOMA. - CERVIX, LEFT FALLOPIAN, AND LEFT OVARY FREE OF TUMOR. - SEE ONCOLOGY TABLE AND COMMENT.      11/16/2016 Surgery    Operation: Robotic-assisted laparoscopic total hysterectomy with bilateral salpingoophorectomy with bilateral SLN biopsy  Surgeon: Donaciano Eva  Operative Findings:  : 7cm normal appearing uterus, normal appearing tubes and ovaries, no suspicious nodes.      11/29/2016 Imaging    1. Interval hysterectomy.  No evidence of metastatic disease. 2. Postsurgical changes in the pelvis with minimal free fluid and soft tissue edema. 3. Stable colon diverticular changes without acute inflammation. 4. Suspected small incidental lesions within the liver and spleen. 5.  Stable sclerotic lesion in the left ischium, likely benign      12/15/2016 Procedure    Successful placement of a right internal jugular approach power injectable Port-A-Cath. The catheter is ready for immediate use      12/22/2016 -  Chemotherapy    She received carboplatin and Taxol       INTERVAL HISTORY: Please see below for problem oriented charting. She returns for cycle 5 of treatment She tolerated last dose chemo well She had very mild peripheral neuropathy, stable She continues to battle with high blood sugar She continues to smoke but is attempting to quit smoking Denies significant nausea or vomiting  REVIEW OF SYSTEMS:   Constitutional: Denies fevers, chills or abnormal weight loss Eyes: Denies blurriness of vision Ears, nose, mouth, throat, and face: Denies mucositis or sore throat Respiratory: Denies cough, dyspnea or wheezes Cardiovascular: Denies palpitation, chest discomfort or lower extremity swelling Gastrointestinal:  Denies nausea, heartburn or change in bowel habits Skin: Denies abnormal skin rashes Lymphatics: Denies new lymphadenopathy or easy bruising Neurological:Denies numbness, tingling or new weaknesses Behavioral/Psych: Mood is stable, no new changes  All other systems were reviewed with the patient and are negative.  I have reviewed the past medical history, past surgical history, social history and family history with the patient and they are unchanged from previous note.  ALLERGIES:  has No Known Allergies.  MEDICATIONS:  Current Outpatient Prescriptions  Medication Sig Dispense Refill  . cyclobenzaprine (FLEXERIL) 10 MG tablet Take 10 mg by mouth as needed for muscle spasms.    Marland Kitchen dexamethasone (DECADRON) 4 MG tablet Take 5 tablets at midnight and 5 tablets at 6  am on the day of chemotherapy, every 3 weeks 30 tablet 1  . escitalopram (LEXAPRO) 10 MG tablet Take 1 tablet (10 mg total) by mouth daily. 30 tablet 0  . ibuprofen (IBU) 600 MG tablet  Take 1 tablet (600 mg total) by mouth every 6 (six) hours as needed. 30 tablet 0  . lidocaine-prilocaine (EMLA) cream Apply to affected area once 30 g 3  . loratadine (CLARITIN) 10 MG tablet Take 10 mg by mouth daily.    . metFORMIN (GLUCOPHAGE-XR) 500 MG 24 hr tablet Take 2 tablets (1,000 mg total) by mouth daily. 90 tablet 1  . ondansetron (ZOFRAN) 8 MG tablet Take 1 tablet (8 mg total) by mouth 2 (two) times daily as needed for refractory nausea / vomiting. Start on day 3 after chemo. 60 tablet 1  . oxyCODONE-acetaminophen (PERCOCET/ROXICET) 5-325 MG tablet Take 1-2 tablets by mouth every 4 (four) hours as needed (moderate to severe pain (when tolerating fluids)). 30 tablet 0  . prochlorperazine (COMPAZINE) 10 MG tablet TAKE 1 TABLET BY MOUTH EVERY 6 HOURS AS NEEDED FOR NAUSEA OR VOMITING 30 tablet 1  . senna (SENOKOT) 8.6 MG TABS tablet Take 1 tablet (8.6 mg total) by mouth at bedtime. (Patient not taking: Reported on 12/09/2016) 120 each 0  . Vitamin D, Ergocalciferol, (DRISDOL) 50000 units CAPS capsule Take 1 tablet every week for 12 weeks 12 capsule 0   No current facility-administered medications for this visit.    Facility-Administered Medications Ordered in Other Visits  Medication Dose Route Frequency Provider Last Rate Last Dose  . CARBOplatin (PARAPLATIN) 670 mg in sodium chloride 0.9 % 250 mL chemo infusion  670 mg Intravenous Once Alvy Bimler, Darvell Monteforte, MD      . famotidine (PEPCID) IVPB 20 mg premix  20 mg Intravenous Once Alvy Bimler, Khup Sapia, MD      . fosaprepitant (EMEND) 150 mg, dexamethasone (DECADRON) 12 mg in sodium chloride 0.9 % 145 mL IVPB   Intravenous Once Alvy Bimler, Joeleen Wortley, MD      . heparin lock flush 100 unit/mL  500 Units Intracatheter Once PRN Alvy Bimler, Jasmin Winberry, MD      . insulin regular (NOVOLIN R,HUMULIN R) 100 units/mL injection 20 Units  20 Units Subcutaneous Once Alvy Bimler, Nana Hoselton, MD      . PACLitaxel (TAXOL) 330 mg in sodium chloride 0.9 % 500 mL chemo infusion (> 80mg /m2)  175 mg/m2 (Treatment  Plan Recorded) Intravenous Once Nimrit Kehres, MD      . sodium chloride flush (NS) 0.9 % injection 10 mL  10 mL Intracatheter PRN Alvy Bimler, Reg Bircher, MD   10 mL at 02/02/17 1600  . sodium chloride flush (NS) 0.9 % injection 10 mL  10 mL Intracatheter PRN Alvy Bimler, Aidenn Skellenger, MD        PHYSICAL EXAMINATION: ECOG PERFORMANCE STATUS: 1 - Symptomatic but completely ambulatory  Vitals:   03/23/17 0816  BP: 133/78  Pulse: 98  Resp: 20  Temp: 98.4 F (36.9 C)   Filed Weights   03/23/17 0816  Weight: 158 lb 3.2 oz (71.8 kg)    GENERAL:alert, no distress and comfortable SKIN: skin color, texture, turgor are normal, no rashes or significant lesions EYES: normal, Conjunctiva are pink and non-injected, sclera clear OROPHARYNX:no exudate, no erythema and lips, buccal mucosa, and tongue normal  NECK: supple, thyroid normal size, non-tender, without nodularity LYMPH:  no palpable lymphadenopathy in the cervical, axillary or inguinal LUNGS: clear to auscultation and percussion with normal breathing effort HEART: regular rate & rhythm and no murmurs and  no lower extremity edema ABDOMEN:abdomen soft, non-tender and normal bowel sounds Musculoskeletal:no cyanosis of digits and no clubbing  NEURO: alert & oriented x 3 with fluent speech, no focal motor/sensory deficits  LABORATORY DATA:  I have reviewed the data as listed    Component Value Date/Time   NA 137 03/23/2017 0836   K 3.9 03/23/2017 0836   CL 105 11/17/2016 0451   CO2 20 (L) 03/23/2017 0836   GLUCOSE 407 (H) 03/23/2017 0836   BUN 12.4 03/23/2017 0836   CREATININE 0.9 03/23/2017 0836   CALCIUM 9.7 03/23/2017 0836   PROT 6.9 03/23/2017 0836   ALBUMIN 3.8 03/23/2017 0836   AST 17 03/23/2017 0836   ALT 28 03/23/2017 0836   ALKPHOS 115 03/23/2017 0836   BILITOT 0.73 03/23/2017 0836   GFRNONAA >60 11/17/2016 0451   GFRAA >60 11/17/2016 0451    No results found for: SPEP, UPEP  Lab Results  Component Value Date   WBC 6.9 03/23/2017    NEUTROABS 6.4 03/23/2017   HGB 12.3 03/23/2017   HCT 36.0 03/23/2017   MCV 96.5 03/23/2017   PLT 222 03/23/2017      Chemistry      Component Value Date/Time   NA 137 03/23/2017 0836   K 3.9 03/23/2017 0836   CL 105 11/17/2016 0451   CO2 20 (L) 03/23/2017 0836   BUN 12.4 03/23/2017 0836   CREATININE 0.9 03/23/2017 0836   GLU 170 (H) 02/23/2017 1431      Component Value Date/Time   CALCIUM 9.7 03/23/2017 0836   ALKPHOS 115 03/23/2017 0836   AST 17 03/23/2017 0836   ALT 28 03/23/2017 0836   BILITOT 0.73 03/23/2017 0836      ASSESSMENT & PLAN:  Endometrial cancer ([place]) She tolerated chemotherapy fairly well except for expected side effects from treatment which is manageable We will continue cycle 5 of treatment without dose adjustment  Tobacco user I spent some time counseling the patient the importance of tobacco cessation She is attempting to quit on her own  Peripheral neuropathy due to chemotherapy [facility]) she has mild peripheral neuropathy, likely related to side effects of treatment. It is only mild, not bothering the patient. I will observe for now If it gets worse in the future, I will consider modifying the dose of the treatment   Type 2 diabetes mellitus ([place]) She has poorly controlled diabetes due to steroid treatment and chemotherapy I will continue to support her with regular insulin during treatment I have increased her Metformin to 1000 mg to be used for the next few weeks I will see her back again for diabetes control in her next visit   No orders of the defined types were placed in this encounter.  All questions were answered. The patient knows to call the clinic with any problems, questions or concerns. No barriers to learning was detected. I spent 15 minutes counseling the patient face to face. The total time spent in the appointment was 20 minutes and more than 50% was on counseling and review of test results     Heath Lark, MD 03/23/2017 9:46  AM

## 2017-03-23 NOTE — Assessment & Plan Note (Signed)
She tolerated chemotherapy fairly well except for expected side effects from treatment which is manageable We will continue cycle 5 of treatment without dose adjustment

## 2017-03-23 NOTE — Assessment & Plan Note (Signed)
she has mild peripheral neuropathy, likely related to side effects of treatment. It is only mild, not bothering the patient. I will observe for now If it gets worse in the future, I will consider modifying the dose of the treatment  

## 2017-03-23 NOTE — Patient Instructions (Signed)
[facility] Discharge Instructions for Patients Receiving Chemotherapy  Today you received the following chemotherapy agents Taxol and Carboplatin. To help prevent nausea and vomiting after your treatment, we encourage you to take your nausea medication as directed.  If you develop nausea and vomiting that is not controlled by your nausea medication, call the clinic.   BELOW ARE SYMPTOMS THAT SHOULD BE REPORTED IMMEDIATELY:  *FEVER GREATER THAN 100.5 F  *CHILLS WITH OR WITHOUT FEVER  NAUSEA AND VOMITING THAT IS NOT CONTROLLED WITH YOUR NAUSEA MEDICATION  *UNUSUAL SHORTNESS OF BREATH  *UNUSUAL BRUISING OR BLEEDING  TENDERNESS IN MOUTH AND THROAT WITH OR WITHOUT PRESENCE OF ULCERS  *URINARY PROBLEMS  *BOWEL PROBLEMS  UNUSUAL RASH Items with * indicate a potential emergency and should be followed up as soon as possible.  Feel free to call the clinic you have any questions or concerns. The clinic phone number is [phone].  Please show the CHEMO ALERT CARD at check-in to the Emergency Department and triage nurse.    

## 2017-03-25 ENCOUNTER — Ambulatory Visit (HOSPITAL_BASED_OUTPATIENT_CLINIC_OR_DEPARTMENT_OTHER): Payer: BLUE CROSS/BLUE SHIELD

## 2017-03-25 VITALS — BP 134/73 | HR 90 | Temp 97.8°F | Resp 18

## 2017-03-25 DIAGNOSIS — Z5189 Encounter for other specified aftercare: Secondary | ICD-10-CM | POA: Diagnosis not present

## 2017-03-25 DIAGNOSIS — C541 Malignant neoplasm of endometrium: Secondary | ICD-10-CM | POA: Diagnosis not present

## 2017-03-25 MED ORDER — PEGFILGRASTIM INJECTION 6 MG/0.6ML ~~LOC~~
6.0000 mg | PREFILLED_SYRINGE | Freq: Once | SUBCUTANEOUS | Status: AC
  Administered 2017-03-25: 6 mg via SUBCUTANEOUS
  Filled 2017-03-25: qty 0.6

## 2017-03-25 NOTE — Patient Instructions (Signed)
Pegfilgrastim injection What is this medicine? PEGFILGRASTIM (PEG fil gra stim) is a long-acting granulocyte colony-stimulating factor that stimulates the growth of neutrophils, a type of white blood cell important in the body's fight against infection. It is used to reduce the incidence of fever and infection in patients with certain types of cancer who are receiving chemotherapy that affects the bone marrow, and to increase survival after being exposed to high doses of radiation. This medicine may be used for other purposes; ask your health care provider or pharmacist if you have questions. COMMON BRAND NAME(S): Neulasta What should I tell my health care provider before I take this medicine? They need to know if you have any of these conditions: -kidney disease -latex allergy -ongoing radiation therapy -sickle cell disease -skin reactions to acrylic adhesives (On-Body Injector only) -an unusual or allergic reaction to pegfilgrastim, filgrastim, other medicines, foods, dyes, or preservatives -pregnant or trying to get pregnant -breast-feeding How should I use this medicine? This medicine is for injection under the skin. If you get this medicine at home, you will be taught how to prepare and give the pre-filled syringe or how to use the On-body Injector. Refer to the patient Instructions for Use for detailed instructions. Use exactly as directed. Tell your healthcare provider immediately if you suspect that the On-body Injector may not have performed as intended or if you suspect the use of the On-body Injector resulted in a missed or partial dose. It is important that you put your used needles and syringes in a special sharps container. Do not put them in a trash can. If you do not have a sharps container, call your pharmacist or healthcare provider to get one. Talk to your pediatrician regarding the use of this medicine in children. While this drug may be prescribed for selected conditions,  precautions do apply. Overdosage: If you think you have taken too much of this medicine contact a poison control center or emergency room at once. NOTE: This medicine is only for you. Do not share this medicine with others. What if I miss a dose? It is important not to miss your dose. Call your doctor or health care professional if you miss your dose. If you miss a dose due to an On-body Injector failure or leakage, a new dose should be administered as soon as possible using a single prefilled syringe for manual use. What may interact with this medicine? Interactions have not been studied. Give your health care provider a list of all the medicines, herbs, non-prescription drugs, or dietary supplements you use. Also tell them if you smoke, drink alcohol, or use illegal drugs. Some items may interact with your medicine. This list may not describe all possible interactions. Give your health care provider a list of all the medicines, herbs, non-prescription drugs, or dietary supplements you use. Also tell them if you smoke, drink alcohol, or use illegal drugs. Some items may interact with your medicine. What should I watch for while using this medicine? You may need blood work done while you are taking this medicine. If you are going to need a MRI, CT scan, or other procedure, tell your doctor that you are using this medicine (On-Body Injector only). What side effects may I notice from receiving this medicine? Side effects that you should report to your doctor or health care professional as soon as possible: -allergic reactions like skin rash, itching or hives, swelling of the face, lips, or tongue -dizziness -fever -pain, redness, or irritation at site   where injected -pinpoint red spots on the skin -red or dark-brown urine -shortness of breath or breathing problems -stomach or side pain, or pain at the shoulder -swelling -tiredness -trouble passing urine or change in the amount of urine Side  effects that usually do not require medical attention (report to your doctor or health care professional if they continue or are bothersome): -bone pain -muscle pain This list may not describe all possible side effects. Call your doctor for medical advice about side effects. You may report side effects to FDA at 1-800-FDA-1088. Where should I keep my medicine? Keep out of the reach of children. Store pre-filled syringes in a refrigerator between 2 and 8 degrees C (36 and 46 degrees F). Do not freeze. Keep in carton to protect from light. Throw away this medicine if it is left out of the refrigerator for more than 48 hours. Throw away any unused medicine after the expiration date. NOTE: This sheet is a summary. It may not cover all possible information. If you have questions about this medicine, talk to your doctor, pharmacist, or health care provider.  2018 Elsevier/Gold Standard (2016-07-29 12:58:03)  

## 2017-04-13 ENCOUNTER — Other Ambulatory Visit: Payer: Self-pay | Admitting: *Deleted

## 2017-04-13 ENCOUNTER — Ambulatory Visit (HOSPITAL_BASED_OUTPATIENT_CLINIC_OR_DEPARTMENT_OTHER): Payer: BLUE CROSS/BLUE SHIELD

## 2017-04-13 ENCOUNTER — Ambulatory Visit (HOSPITAL_BASED_OUTPATIENT_CLINIC_OR_DEPARTMENT_OTHER): Payer: BLUE CROSS/BLUE SHIELD | Admitting: Hematology and Oncology

## 2017-04-13 ENCOUNTER — Other Ambulatory Visit: Payer: BLUE CROSS/BLUE SHIELD

## 2017-04-13 ENCOUNTER — Encounter: Payer: Self-pay | Admitting: Hematology and Oncology

## 2017-04-13 ENCOUNTER — Ambulatory Visit: Payer: BLUE CROSS/BLUE SHIELD

## 2017-04-13 ENCOUNTER — Ambulatory Visit (HOSPITAL_BASED_OUTPATIENT_CLINIC_OR_DEPARTMENT_OTHER): Payer: BLUE CROSS/BLUE SHIELD | Admitting: *Deleted

## 2017-04-13 ENCOUNTER — Telehealth: Payer: Self-pay | Admitting: Hematology and Oncology

## 2017-04-13 VITALS — BP 115/77 | HR 98 | Temp 97.6°F | Resp 18 | Ht 68.0 in | Wt 157.2 lb

## 2017-04-13 DIAGNOSIS — Z5111 Encounter for antineoplastic chemotherapy: Secondary | ICD-10-CM | POA: Diagnosis not present

## 2017-04-13 DIAGNOSIS — E119 Type 2 diabetes mellitus without complications: Secondary | ICD-10-CM

## 2017-04-13 DIAGNOSIS — T451X5A Adverse effect of antineoplastic and immunosuppressive drugs, initial encounter: Secondary | ICD-10-CM

## 2017-04-13 DIAGNOSIS — C541 Malignant neoplasm of endometrium: Secondary | ICD-10-CM

## 2017-04-13 DIAGNOSIS — E1100 Type 2 diabetes mellitus with hyperosmolarity without nonketotic hyperglycemic-hyperosmolar coma (NKHHC): Secondary | ICD-10-CM | POA: Diagnosis not present

## 2017-04-13 DIAGNOSIS — Z720 Tobacco use: Secondary | ICD-10-CM

## 2017-04-13 DIAGNOSIS — E559 Vitamin D deficiency, unspecified: Secondary | ICD-10-CM | POA: Diagnosis not present

## 2017-04-13 DIAGNOSIS — C7961 Secondary malignant neoplasm of right ovary: Secondary | ICD-10-CM

## 2017-04-13 DIAGNOSIS — G620 Drug-induced polyneuropathy: Secondary | ICD-10-CM

## 2017-04-13 DIAGNOSIS — E1110 Type 2 diabetes mellitus with ketoacidosis without coma: Secondary | ICD-10-CM

## 2017-04-13 DIAGNOSIS — Z95828 Presence of other vascular implants and grafts: Secondary | ICD-10-CM

## 2017-04-13 LAB — CBC WITH DIFFERENTIAL/PLATELET
BASO%: 0.1 % (ref 0.0–2.0)
BASOS ABS: 0 10*3/uL (ref 0.0–0.1)
EOS ABS: 0 10*3/uL (ref 0.0–0.5)
EOS%: 0 % (ref 0.0–7.0)
HCT: 34.4 % — ABNORMAL LOW (ref 34.8–46.6)
HGB: 11.9 g/dL (ref 11.6–15.9)
LYMPH%: 8.1 % — AB (ref 14.0–49.7)
MCH: 33.7 pg (ref 25.1–34.0)
MCHC: 34.6 g/dL (ref 31.5–36.0)
MCV: 97.4 fL (ref 79.5–101.0)
MONO#: 0.1 10*3/uL (ref 0.1–0.9)
MONO%: 1.1 % (ref 0.0–14.0)
NEUT#: 5.2 10*3/uL (ref 1.5–6.5)
NEUT%: 90.7 % — ABNORMAL HIGH (ref 38.4–76.8)
Platelets: 176 10*3/uL (ref 145–400)
RBC: 3.53 10*6/uL — AB (ref 3.70–5.45)
RDW: 14.6 % — AB (ref 11.2–14.5)
WBC: 5.7 10*3/uL (ref 3.9–10.3)
lymph#: 0.5 10*3/uL — ABNORMAL LOW (ref 0.9–3.3)

## 2017-04-13 LAB — COMPREHENSIVE METABOLIC PANEL
ALBUMIN: 3.8 g/dL (ref 3.5–5.0)
ALK PHOS: 116 U/L (ref 40–150)
ALT: 22 U/L (ref 0–55)
AST: 14 U/L (ref 5–34)
Anion Gap: 11 mEq/L (ref 3–11)
BUN: 13.9 mg/dL (ref 7.0–26.0)
CO2: 23 meq/L (ref 22–29)
Calcium: 9.6 mg/dL (ref 8.4–10.4)
Chloride: 102 mEq/L (ref 98–109)
Creatinine: 0.8 mg/dL (ref 0.6–1.1)
EGFR: 80 mL/min/{1.73_m2} — AB (ref >90)
GLUCOSE: 355 mg/dL — AB (ref 70–140)
POTASSIUM: 3.7 meq/L (ref 3.5–5.1)
SODIUM: 136 meq/L (ref 136–145)
Total Bilirubin: 0.58 mg/dL (ref 0.20–1.20)
Total Protein: 7.1 g/dL (ref 6.4–8.3)

## 2017-04-13 LAB — WHOLE BLOOD GLUCOSE
Glucose: 307 mg/dL — ABNORMAL HIGH (ref 70–100)
HRS PC: 1 h

## 2017-04-13 MED ORDER — FAMOTIDINE IN NACL 20-0.9 MG/50ML-% IV SOLN
INTRAVENOUS | Status: AC
  Filled 2017-04-13: qty 50

## 2017-04-13 MED ORDER — SODIUM CHLORIDE 0.9 % IV SOLN
Freq: Once | INTRAVENOUS | Status: AC
  Administered 2017-04-13: 10:00:00 via INTRAVENOUS

## 2017-04-13 MED ORDER — INSULIN REGULAR HUMAN 100 UNIT/ML IJ SOLN
20.0000 [IU] | Freq: Once | INTRAMUSCULAR | Status: AC
  Administered 2017-04-13: 20 [IU] via SUBCUTANEOUS
  Filled 2017-04-13: qty 0.2

## 2017-04-13 MED ORDER — FAMOTIDINE IN NACL 20-0.9 MG/50ML-% IV SOLN
20.0000 mg | Freq: Once | INTRAVENOUS | Status: AC
  Administered 2017-04-13: 20 mg via INTRAVENOUS

## 2017-04-13 MED ORDER — SODIUM CHLORIDE 0.9 % IV SOLN
175.0000 mg/m2 | Freq: Once | INTRAVENOUS | Status: AC
  Administered 2017-04-13: 330 mg via INTRAVENOUS
  Filled 2017-04-13: qty 55

## 2017-04-13 MED ORDER — DIPHENHYDRAMINE HCL 50 MG/ML IJ SOLN
50.0000 mg | Freq: Once | INTRAMUSCULAR | Status: AC
  Administered 2017-04-13: 50 mg via INTRAVENOUS

## 2017-04-13 MED ORDER — PALONOSETRON HCL INJECTION 0.25 MG/5ML
0.2500 mg | Freq: Once | INTRAVENOUS | Status: AC
  Administered 2017-04-13: 0.25 mg via INTRAVENOUS

## 2017-04-13 MED ORDER — SODIUM CHLORIDE 0.9 % IV SOLN
Freq: Once | INTRAVENOUS | Status: AC
  Administered 2017-04-13: 11:00:00 via INTRAVENOUS
  Filled 2017-04-13: qty 5

## 2017-04-13 MED ORDER — SODIUM CHLORIDE 0.9% FLUSH
10.0000 mL | Freq: Once | INTRAVENOUS | Status: AC
  Administered 2017-04-13: 10 mL
  Filled 2017-04-13: qty 10

## 2017-04-13 MED ORDER — PALONOSETRON HCL INJECTION 0.25 MG/5ML
INTRAVENOUS | Status: AC
  Filled 2017-04-13: qty 5

## 2017-04-13 MED ORDER — DIPHENHYDRAMINE HCL 50 MG/ML IJ SOLN
INTRAMUSCULAR | Status: AC
  Filled 2017-04-13: qty 1

## 2017-04-13 MED ORDER — HEPARIN SOD (PORK) LOCK FLUSH 100 UNIT/ML IV SOLN
500.0000 [IU] | Freq: Once | INTRAVENOUS | Status: AC | PRN
  Administered 2017-04-13: 500 [IU]
  Filled 2017-04-13: qty 5

## 2017-04-13 MED ORDER — SODIUM CHLORIDE 0.9 % IV SOLN
670.0000 mg | Freq: Once | INTRAVENOUS | Status: AC
  Administered 2017-04-13: 670 mg via INTRAVENOUS
  Filled 2017-04-13: qty 67

## 2017-04-13 MED ORDER — SODIUM CHLORIDE 0.9% FLUSH
10.0000 mL | INTRAVENOUS | Status: DC | PRN
  Administered 2017-04-13: 10 mL
  Filled 2017-04-13: qty 10

## 2017-04-13 NOTE — Telephone Encounter (Signed)
Per 8/29 Return for No new orders.

## 2017-04-13 NOTE — Patient Instructions (Signed)
[facility] Discharge Instructions for Patients Receiving Chemotherapy  Today you received the following chemotherapy agents taxol/carboplatin  To help prevent nausea and vomiting after your treatment, we encourage you to take your nausea medication as directed   If you develop nausea and vomiting that is not controlled by your nausea medication, call the clinic.   BELOW ARE SYMPTOMS THAT SHOULD BE REPORTED IMMEDIATELY:  *FEVER GREATER THAN 100.5 F  *CHILLS WITH OR WITHOUT FEVER  NAUSEA AND VOMITING THAT IS NOT CONTROLLED WITH YOUR NAUSEA MEDICATION  *UNUSUAL SHORTNESS OF BREATH  *UNUSUAL BRUISING OR BLEEDING  TENDERNESS IN MOUTH AND THROAT WITH OR WITHOUT PRESENCE OF ULCERS  *URINARY PROBLEMS  *BOWEL PROBLEMS  UNUSUAL RASH Items with * indicate a potential emergency and should be followed up as soon as possible.  Feel free to call the clinic you have any questions or concerns. The clinic phone number is [phone].  

## 2017-04-14 ENCOUNTER — Encounter: Payer: Self-pay | Admitting: Hematology and Oncology

## 2017-04-14 ENCOUNTER — Telehealth: Payer: Self-pay | Admitting: *Deleted

## 2017-04-14 ENCOUNTER — Other Ambulatory Visit: Payer: Self-pay | Admitting: Hematology and Oncology

## 2017-04-14 ENCOUNTER — Telehealth: Payer: Self-pay | Admitting: Oncology

## 2017-04-14 LAB — VITAMIN D 25 HYDROXY (VIT D DEFICIENCY, FRACTURES): Vitamin D, 25-Hydroxy: 15.8 ng/mL — ABNORMAL LOW (ref 30.0–100.0)

## 2017-04-14 NOTE — Assessment & Plan Note (Signed)
I spent some time counseling the patient the importance of tobacco cessation She is attempting to quit on her own

## 2017-04-14 NOTE — Telephone Encounter (Signed)
Patient left a message saying that she missed her 8/13 follow up appointment with Dr. Sondra Come.  She would like to reschedule.

## 2017-04-14 NOTE — Telephone Encounter (Signed)
-----   Message from Heath Lark, MD sent at 04/14/2017  8:59 AM EDT ----- Regarding: labs pls call and ask if she is on vitamin D, if not call in 50,000 units weekly PO X 12 weeks, no refills and follow-up with PCP in 3 months ----- Message ----- From: Interface, Lab In Three Zero One Sent: 04/13/2017   9:33 AM To: Heath Lark, MD

## 2017-04-14 NOTE — Assessment & Plan Note (Addendum)
The patient is seen prior to cycle 6 of treatment She tolerated treatment well We will proceed with cycle 6 without dose adjustment I will get the port removed in the future She will continue follow-up in the future with Dr. Denman George

## 2017-04-14 NOTE — Telephone Encounter (Signed)
Left message for patient to call dr gorsuch's nurse.

## 2017-04-14 NOTE — Assessment & Plan Note (Signed)
She has poorly controlled diabetes due to steroid treatment and chemotherapy I will continue to support her with regular insulin during treatment I have increased her Metformin to 1000 mg to be used for the next few weeks I reminded her to follow closely with primary care doctor for medication adjustment in the future.

## 2017-04-14 NOTE — Assessment & Plan Note (Addendum)
she has mild peripheral neuropathy, likely related to side effects of treatment. It is only mild, not bothering the patient. I will observe for now I suspect it will improve in the future after stopping treatment.

## 2017-04-14 NOTE — Progress Notes (Signed)
[place] OFFICE PROGRESS NOTE  Patient Care Team: [name], FNP as PCP - General (Nurse Practitioner)  SUMMARY OF ONCOLOGIC HISTORY:   Endometrial cancer ([place])   09/22/2016 Initial Diagnosis    She presented to her physician with postmenopausal bleeding      10/13/2016 Imaging    The patient reported post-menopausal bleeding to her physician who performed a TVUS on 10/13/16. This showed a uterus measuring 7.6x4.6x 6.26cm. The endometrial lining measuringed 2.24cm. The ovaries were normal. A 4cm intracavitary mass was appreciated on [place].       10/13/2016 Pathology Results    The patient then underwent endometrial pipelle biopsy on 10/13/16. It revealed an endometrioid adenocarcinoma, (FIGO grade 1).       11/16/2016 Pathology Results    1. Lymph node, sentinel, biopsy, right obturator - TWO BENIGN LYMPH NODES (0/2). 2. Lymph node, sentinel, biopsy, right external iliac - ONE BENIGN LYMPH NODE (0/1). 3. Lymph node, sentinel, biopsy, left obturator - ONE BENIGN LYMPH NODE (0/1). 4. Uterus +/- tubes/ovaries, neoplastic - ENDOMETRIOID ADENOCARCINOMA, 5.2 CM. - CARCINOMA FOCALLY INVOLVES DEEP MYOMETRIUM. - RIGHT FALLOPIAN TUBE AND RIGHT OVARY INVOLVED BY ENDOMETRIOID CARCINOMA. - CERVIX, LEFT FALLOPIAN, AND LEFT OVARY FREE OF TUMOR. - SEE ONCOLOGY TABLE AND COMMENT.      11/16/2016 Surgery    Operation: Robotic-assisted laparoscopic total hysterectomy with bilateral salpingoophorectomy with bilateral SLN biopsy  Surgeon: Donaciano Eva  Operative Findings:  : 7cm normal appearing uterus, normal appearing tubes and ovaries, no suspicious nodes.      11/29/2016 Imaging    1. Interval hysterectomy.  No evidence of metastatic disease. 2. Postsurgical changes in the pelvis with minimal free fluid and soft tissue edema. 3. Stable colon diverticular changes without acute inflammation. 4. Suspected small incidental lesions within the liver and spleen. 5.  Stable sclerotic lesion in the left ischium, likely benign      12/15/2016 Procedure    Successful placement of a right internal jugular approach power injectable Port-A-Cath. The catheter is ready for immediate use      12/22/2016 -  Chemotherapy    She received carboplatin and Taxol      01/27/2017 - 02/24/2017 Radiation Therapy    Radiation treatment dates:   01/27/2017, 01/31/2017, 02/03/2017, 02/17/2017, 02/24/2017  Site/dose:   The vagina cuff was treated to 30 Gy delivered in 5 fractions of 6 Gy.  Beams/energy:   HDR Ir-192 Vaginal / The patient was treated with a 2.5 cm diameter cylinder with a 4 cm treatment length.       INTERVAL HISTORY: Please see below for problem oriented charting. She is seen prior to cycle 6 of therapy She denies side effects of treatment such as nausea, vomiting or severe constipation She denies pain She continues to smoke but attempting to quit smoking She had very mild peripheral neuropathy, stable She continues to have intermittent high blood sugar after each cycle, stable on current prescription metformin.  REVIEW OF SYSTEMS:   Constitutional: Denies fevers, chills or abnormal weight loss Eyes: Denies blurriness of vision Ears, nose, mouth, throat, and face: Denies mucositis or sore throat Respiratory: Denies cough, dyspnea or wheezes Cardiovascular: Denies palpitation, chest discomfort or lower extremity swelling Gastrointestinal:  Denies nausea, heartburn or change in bowel habits Skin: Denies abnormal skin rashes Lymphatics: Denies new lymphadenopathy or easy bruising Neurological:Denies numbness, tingling or new weaknesses Behavioral/Psych: Mood is stable, no new changes  All other systems were reviewed with the patient and are negative.  I have reviewed the past medical history, past surgical history, social history and family history with the patient and they are unchanged from previous note.  ALLERGIES:  has No Known  Allergies.  MEDICATIONS:  Current Outpatient Prescriptions  Medication Sig Dispense Refill  . cyclobenzaprine (FLEXERIL) 10 MG tablet Take 10 mg by mouth as needed for muscle spasms.    Marland Kitchen dexamethasone (DECADRON) 4 MG tablet Take 5 tablets at midnight and 5 tablets at 6 am on the day of chemotherapy, every 3 weeks 30 tablet 1  . escitalopram (LEXAPRO) 10 MG tablet Take 1 tablet (10 mg total) by mouth daily. 30 tablet 0  . ibuprofen (IBU) 600 MG tablet Take 1 tablet (600 mg total) by mouth every 6 (six) hours as needed. 30 tablet 0  . lidocaine-prilocaine (EMLA) cream Apply to affected area once 30 g 3  . loratadine (CLARITIN) 10 MG tablet Take 10 mg by mouth daily.    . metFORMIN (GLUCOPHAGE-XR) 500 MG 24 hr tablet Take 2 tablets (1,000 mg total) by mouth daily. 90 tablet 1  . ondansetron (ZOFRAN) 8 MG tablet Take 1 tablet (8 mg total) by mouth 2 (two) times daily as needed for refractory nausea / vomiting. Start on day 3 after chemo. 60 tablet 1  . oxyCODONE-acetaminophen (PERCOCET/ROXICET) 5-325 MG tablet Take 1-2 tablets by mouth every 4 (four) hours as needed (moderate to severe pain (when tolerating fluids)). 30 tablet 0  . prochlorperazine (COMPAZINE) 10 MG tablet TAKE 1 TABLET BY MOUTH EVERY 6 HOURS AS NEEDED FOR NAUSEA OR VOMITING 30 tablet 1  . senna (SENOKOT) 8.6 MG TABS tablet Take 1 tablet (8.6 mg total) by mouth at bedtime. (Patient not taking: Reported on 12/09/2016) 120 each 0  . Vitamin D, Ergocalciferol, (DRISDOL) 50000 units CAPS capsule Take 1 tablet every week for 12 weeks 12 capsule 0   No current facility-administered medications for this visit.    Facility-Administered Medications Ordered in Other Visits  Medication Dose Route Frequency Provider Last Rate Last Dose  . sodium chloride flush (NS) 0.9 % injection 10 mL  10 mL Intracatheter PRN Alvy Bimler, Teaira Croft, MD   10 mL at 02/02/17 1600    PHYSICAL EXAMINATION: ECOG PERFORMANCE STATUS: 1 - Symptomatic but completely  ambulatory  Vitals:   04/13/17 0929  BP: 115/77  Pulse: 98  Resp: 18  Temp: 97.6 F (36.4 C)  SpO2: 96%   Filed Weights   04/13/17 0929  Weight: 157 lb 3.2 oz (71.3 kg)    GENERAL:alert, no distress and comfortable SKIN: skin color, texture, turgor are normal, no rashes or significant lesions EYES: normal, Conjunctiva are pink and non-injected, sclera clear OROPHARYNX:no exudate, no erythema and lips, buccal mucosa, and tongue normal  NECK: supple, thyroid normal size, non-tender, without nodularity LYMPH:  no palpable lymphadenopathy in the cervical, axillary or inguinal LUNGS: clear to auscultation and percussion with normal breathing effort HEART: regular rate & rhythm and no murmurs and no lower extremity edema ABDOMEN:abdomen soft, non-tender and normal bowel sounds Musculoskeletal:no cyanosis of digits and no clubbing  NEURO: alert & oriented x 3 with fluent speech, no focal motor/sensory deficits  LABORATORY DATA:  I have reviewed the data as listed    Component Value Date/Time   NA 136 04/13/2017 0849   K 3.7 04/13/2017 0849   CL 105 11/17/2016 0451   CO2 23 04/13/2017 0849   GLUCOSE 355 (H) 04/13/2017 0849   BUN 13.9 04/13/2017 0849   CREATININE 0.8 04/13/2017 0849  CALCIUM 9.6 04/13/2017 0849   PROT 7.1 04/13/2017 0849   ALBUMIN 3.8 04/13/2017 0849   AST 14 04/13/2017 0849   ALT 22 04/13/2017 0849   ALKPHOS 116 04/13/2017 0849   BILITOT 0.58 04/13/2017 0849   GFRNONAA >60 11/17/2016 0451   GFRAA >60 11/17/2016 0451    No results found for: SPEP, UPEP  Lab Results  Component Value Date   WBC 5.7 04/13/2017   NEUTROABS 5.2 04/13/2017   HGB 11.9 04/13/2017   HCT 34.4 (L) 04/13/2017   MCV 97.4 04/13/2017   PLT 176 04/13/2017      Chemistry      Component Value Date/Time   NA 136 04/13/2017 0849   K 3.7 04/13/2017 0849   CL 105 11/17/2016 0451   CO2 23 04/13/2017 0849   BUN 13.9 04/13/2017 0849   CREATININE 0.8 04/13/2017 0849   GLU 307 (H)  04/13/2017 1249      Component Value Date/Time   CALCIUM 9.6 04/13/2017 0849   ALKPHOS 116 04/13/2017 0849   AST 14 04/13/2017 0849   ALT 22 04/13/2017 0849   BILITOT 0.58 04/13/2017 0849       ASSESSMENT & PLAN:  Endometrial cancer ([place]) The patient is seen prior to cycle 6 of treatment She tolerated treatment well We will proceed with cycle 6 without dose adjustment I will get the port removed in the future She will continue follow-up in the future with Dr. Denman George  Type 2 diabetes mellitus ([place]) She has poorly controlled diabetes due to steroid treatment and chemotherapy I will continue to support her with regular insulin during treatment I have increased her Metformin to 1000 mg to be used for the next few weeks I reminded her to follow closely with primary care doctor for medication adjustment in the future.   Tobacco user I spent some time counseling the patient the importance of tobacco cessation She is attempting to quit on her own  Peripheral neuropathy due to chemotherapy [facility]) she has mild peripheral neuropathy, likely related to side effects of treatment. It is only mild, not bothering the patient. I will observe for now I suspect it will improve in the future after stopping treatment.   No orders of the defined types were placed in this encounter.  All questions were answered. The patient knows to call the clinic with any problems, questions or concerns. No barriers to learning was detected. I spent 15 minutes counseling the patient face to face. The total time spent in the appointment was 20 minutes and more than 50% was on counseling and review of test results     Heath Lark, MD 04/14/2017 3:28 PM

## 2017-04-15 ENCOUNTER — Ambulatory Visit (HOSPITAL_BASED_OUTPATIENT_CLINIC_OR_DEPARTMENT_OTHER): Payer: BLUE CROSS/BLUE SHIELD

## 2017-04-15 ENCOUNTER — Telehealth: Payer: Self-pay | Admitting: *Deleted

## 2017-04-15 VITALS — BP 139/71 | HR 99 | Temp 97.4°F | Resp 20

## 2017-04-15 DIAGNOSIS — Z5189 Encounter for other specified aftercare: Secondary | ICD-10-CM

## 2017-04-15 DIAGNOSIS — E559 Vitamin D deficiency, unspecified: Secondary | ICD-10-CM

## 2017-04-15 DIAGNOSIS — C541 Malignant neoplasm of endometrium: Secondary | ICD-10-CM | POA: Diagnosis not present

## 2017-04-15 MED ORDER — PEGFILGRASTIM INJECTION 6 MG/0.6ML ~~LOC~~
6.0000 mg | PREFILLED_SYRINGE | Freq: Once | SUBCUTANEOUS | Status: AC
  Administered 2017-04-15: 6 mg via SUBCUTANEOUS
  Filled 2017-04-15: qty 0.6

## 2017-04-15 MED ORDER — VITAMIN D (ERGOCALCIFEROL) 1.25 MG (50000 UNIT) PO CAPS: ORAL_CAPSULE | ORAL | 0 refills | Status: DC

## 2017-04-15 NOTE — Telephone Encounter (Signed)
[name] states she still has Vitamin D, but missed a few doses. Dr Alvy Bimler wants her to take another 12 weeks and then have level checked by PCP.  Verbalized understanding.

## 2017-04-15 NOTE — Telephone Encounter (Signed)
-----   Message from Heath Lark, MD sent at 04/14/2017  6:57 AM EDT ----- Regarding: low vitamin D Is she taking vitamin D? If not, call in 50,000 units once a week x 12 week, no refills She needs follow-up vitamin D level with PCP in 3 months ----- Message ----- From: Interface, Lab In Three Zero One Sent: 04/13/2017   9:33 AM To: Heath Lark, MD

## 2017-04-15 NOTE — Patient Instructions (Signed)
Pegfilgrastim injection What is this medicine? PEGFILGRASTIM (PEG fil gra stim) is a long-acting granulocyte colony-stimulating factor that stimulates the growth of neutrophils, a type of white blood cell important in the body's fight against infection. It is used to reduce the incidence of fever and infection in patients with certain types of cancer who are receiving chemotherapy that affects the bone marrow, and to increase survival after being exposed to high doses of radiation. This medicine may be used for other purposes; ask your health care provider or pharmacist if you have questions. COMMON BRAND NAME(S): Neulasta What should I tell my health care provider before I take this medicine? They need to know if you have any of these conditions: -kidney disease -latex allergy -ongoing radiation therapy -sickle cell disease -skin reactions to acrylic adhesives (On-Body Injector only) -an unusual or allergic reaction to pegfilgrastim, filgrastim, other medicines, foods, dyes, or preservatives -pregnant or trying to get pregnant -breast-feeding How should I use this medicine? This medicine is for injection under the skin. If you get this medicine at home, you will be taught how to prepare and give the pre-filled syringe or how to use the On-body Injector. Refer to the patient Instructions for Use for detailed instructions. Use exactly as directed. Tell your healthcare provider immediately if you suspect that the On-body Injector may not have performed as intended or if you suspect the use of the On-body Injector resulted in a missed or partial dose. It is important that you put your used needles and syringes in a special sharps container. Do not put them in a trash can. If you do not have a sharps container, call your pharmacist or healthcare provider to get one. Talk to your pediatrician regarding the use of this medicine in children. While this drug may be prescribed for selected conditions,  precautions do apply. Overdosage: If you think you have taken too much of this medicine contact a poison control center or emergency room at once. NOTE: This medicine is only for you. Do not share this medicine with others. What if I miss a dose? It is important not to miss your dose. Call your doctor or health care professional if you miss your dose. If you miss a dose due to an On-body Injector failure or leakage, a new dose should be administered as soon as possible using a single prefilled syringe for manual use. What may interact with this medicine? Interactions have not been studied. Give your health care provider a list of all the medicines, herbs, non-prescription drugs, or dietary supplements you use. Also tell them if you smoke, drink alcohol, or use illegal drugs. Some items may interact with your medicine. This list may not describe all possible interactions. Give your health care provider a list of all the medicines, herbs, non-prescription drugs, or dietary supplements you use. Also tell them if you smoke, drink alcohol, or use illegal drugs. Some items may interact with your medicine. What should I watch for while using this medicine? You may need blood work done while you are taking this medicine. If you are going to need a MRI, CT scan, or other procedure, tell your doctor that you are using this medicine (On-Body Injector only). What side effects may I notice from receiving this medicine? Side effects that you should report to your doctor or health care professional as soon as possible: -allergic reactions like skin rash, itching or hives, swelling of the face, lips, or tongue -dizziness -fever -pain, redness, or irritation at site   where injected -pinpoint red spots on the skin -red or dark-brown urine -shortness of breath or breathing problems -stomach or side pain, or pain at the shoulder -swelling -tiredness -trouble passing urine or change in the amount of urine Side  effects that usually do not require medical attention (report to your doctor or health care professional if they continue or are bothersome): -bone pain -muscle pain This list may not describe all possible side effects. Call your doctor for medical advice about side effects. You may report side effects to FDA at 1-800-FDA-1088. Where should I keep my medicine? Keep out of the reach of children. Store pre-filled syringes in a refrigerator between 2 and 8 degrees C (36 and 46 degrees F). Do not freeze. Keep in carton to protect from light. Throw away this medicine if it is left out of the refrigerator for more than 48 hours. Throw away any unused medicine after the expiration date. NOTE: This sheet is a summary. It may not cover all possible information. If you have questions about this medicine, talk to your doctor, pharmacist, or health care provider.  2018 Elsevier/Gold Standard (2016-07-29 12:58:03)  

## 2017-04-20 ENCOUNTER — Ambulatory Visit (INDEPENDENT_AMBULATORY_CARE_PROVIDER_SITE_OTHER): Payer: BLUE CROSS/BLUE SHIELD | Admitting: Family

## 2017-04-20 ENCOUNTER — Encounter: Payer: Self-pay | Admitting: Family

## 2017-04-20 VITALS — BP 112/76 | HR 120 | Temp 96.9°F | Ht 68.0 in | Wt 152.6 lb

## 2017-04-20 DIAGNOSIS — J0100 Acute maxillary sinusitis, unspecified: Secondary | ICD-10-CM

## 2017-04-20 MED ORDER — AMOXICILLIN-POT CLAVULANATE 875-125 MG PO TABS: 1.0000 | ORAL_TABLET | Freq: Two times a day (BID) | ORAL | 0 refills | Status: DC

## 2017-04-20 NOTE — Progress Notes (Signed)
   Subjective:    Patient ID: [name], female    DOB: 28-Feb-1966, 51 y.o.   MRN: [id number]  Pt presents to the office today with sinus pressure greater than a week. Pt is currently receiving chemiotherapy for endometrial cancer. Sinus Problem  This is a new problem. The current episode started 1 to 4 weeks ago. The problem has been gradually worsening since onset. There has been no fever. Her pain is at a severity of 9/10. The pain is moderate. Associated symptoms include chills, congestion, coughing, a hoarse voice, shortness of breath, sinus pressure and a sore throat. Pertinent negatives include no ear pain or headaches. Past treatments include oral decongestants. The treatment provided mild relief.      Review of Systems  Constitutional: Positive for chills.  HENT: Positive for congestion, hoarse voice, sinus pressure and sore throat. Negative for ear pain.   Respiratory: Positive for cough and shortness of breath.   Neurological: Negative for headaches.  All other systems reviewed and are negative.      Objective:   Physical Exam  Constitutional: She is oriented to person, place, and time. She appears well-developed and well-nourished. No distress.  HENT:  Head: Normocephalic and atraumatic.  Right Ear: External ear normal.  Left Ear: External ear normal.  Nose: Mucosal edema and rhinorrhea present. Right sinus exhibits maxillary sinus tenderness. Left sinus exhibits maxillary sinus tenderness.  Mouth/Throat: Posterior oropharyngeal erythema present.  Eyes: Pupils are equal, round, and reactive to light.  Neck: Normal range of motion. Neck supple. No thyromegaly present.  Cardiovascular: Normal rate, regular rhythm, normal heart sounds and intact distal pulses.   No murmur heard. Pulmonary/Chest: Effort normal and breath sounds normal. No respiratory distress. She has no wheezes.  Abdominal: Soft. Bowel sounds are normal. She exhibits no distension. There is no  tenderness.  Musculoskeletal: Normal range of motion. She exhibits no edema or tenderness.  Neurological: She is alert and oriented to person, place, and time. She has normal reflexes. No cranial nerve deficit.  Skin: Skin is warm and dry.  Psychiatric: She has a normal mood and affect. Her behavior is normal. Judgment and thought content normal.  Vitals reviewed.     BP 112/76   Pulse (!) 120   Temp (!) 96.9 F (36.1 C) (Oral)   Ht 5\' 8"  (1.727 m)   Wt 152 lb 9.6 oz (69.2 kg)   LMP 07/25/2014 Comment: perimenopausal  BMI 23.20 kg/m      Assessment & Plan:  1. Acute maxillary sinusitis, recurrence not specified - Take meds as prescribed - Use a cool mist humidifier  -Use saline nose sprays frequently  Follow directions with this* -Force fluids -For any cough or congestion  Use plain Mucinex- regular strength or max strength is fine -For fever or aces or pains- take tylenol or ibuprofen appropriate for age and weight.  * for fevers greater than 101 orally you may alternate ibuprofen and tylenol every  3 hours. -Throat lozenges if help - amoxicillin-clavulanate (AUGMENTIN) 875-125 MG tablet; Take 1 tablet by mouth 2 (two) times daily.  Dispense: 20 tablet; Refill: 0  Good hand hygiene discussed  Evelina Dun, FNP

## 2017-04-20 NOTE — Patient Instructions (Signed)

## 2017-04-22 ENCOUNTER — Telehealth: Payer: Self-pay | Admitting: *Deleted

## 2017-04-22 NOTE — Telephone Encounter (Signed)
Patient called and was given follow up appt for December 3rd at 2:30pm

## 2017-04-25 ENCOUNTER — Encounter: Payer: Self-pay | Admitting: Family Medicine

## 2017-04-25 ENCOUNTER — Ambulatory Visit (INDEPENDENT_AMBULATORY_CARE_PROVIDER_SITE_OTHER): Payer: BLUE CROSS/BLUE SHIELD | Admitting: Family Medicine

## 2017-04-25 ENCOUNTER — Ambulatory Visit (INDEPENDENT_AMBULATORY_CARE_PROVIDER_SITE_OTHER): Payer: BLUE CROSS/BLUE SHIELD

## 2017-04-25 VITALS — BP 125/81 | HR 108 | Temp 97.2°F | Ht 68.0 in | Wt 154.4 lb

## 2017-04-25 DIAGNOSIS — R059 Cough, unspecified: Secondary | ICD-10-CM

## 2017-04-25 DIAGNOSIS — R05 Cough: Secondary | ICD-10-CM | POA: Diagnosis not present

## 2017-04-25 DIAGNOSIS — E049 Nontoxic goiter, unspecified: Secondary | ICD-10-CM | POA: Insufficient documentation

## 2017-04-25 DIAGNOSIS — I10 Essential (primary) hypertension: Secondary | ICD-10-CM | POA: Insufficient documentation

## 2017-04-25 DIAGNOSIS — E1159 Type 2 diabetes mellitus with other circulatory complications: Secondary | ICD-10-CM | POA: Insufficient documentation

## 2017-04-25 MED ORDER — PREDNISONE 20 MG PO TABS: 40.0000 mg | ORAL_TABLET | Freq: Every day | ORAL | 0 refills | Status: DC

## 2017-04-25 MED ORDER — LEVOFLOXACIN 500 MG PO TABS: 500.0000 mg | ORAL_TABLET | Freq: Every day | ORAL | 0 refills | Status: DC

## 2017-04-25 NOTE — Progress Notes (Signed)
   HPI  Patient presents today with cough.  Patient explains that she's been ill since August 28, she had her last round of chemotherapy for endometrial cancer on August 29.  She had a normal WBC at bedtime.  She complains of persistent cough, chills, and congestion. She was seen in our clinic 5 days ago and treated with Augmentin with no improvement.  She did actually have facial pain and pressure consistent with sinusitis which has improved.  She denies any difficulty tolerating food or fluids. She has lost her voice  PMH: Smoking status noted ROS: Per HPI  Objective: BP 125/81   Pulse (!) 108   Temp (!) 97.2 F (36.2 C) (Oral)   Ht 5\' 8"  (1.727 m)   Wt 154 lb 6.4 oz (70 kg)   LMP 07/25/2014 Comment: perimenopausal  BMI 23.48 kg/m  Gen: NAD, alert, cooperative with exam HEENT: NCAT, moist oral mucosa, nares clear, TMs normal bilaterally, CV: RRR, good S1/S2, no murmur Resp: Scattered expiratory wheeze, however good air movement, nonlabored Ext: No edema, warm Neuro: Alert and oriented, No gross deficits  Assessment and plan:  # Cough Moderate cough, shortness of breath as well, possibly viral illness, however given her recent chemotherapy we will be very vigilant to look for bacterial source. Chest x-ray Short course of prednisone, continue Augmentin unless lobar pneumonia is found CBC  Plain film appears clear to me with Port-A-Cath in place. After discussing with this with the patient, she has not tolerated Augmentin and stop taking it yesterday morning. Escalate to Levaquin, this would be more than adequate sinusitis coverage while also broadening coverage for more serious etiology.   Orders Placed This Encounter  Procedures  . DG Chest 2 View    Standing Status:   Future    Number of Occurrences:   1    Standing Expiration Date:   06/25/2018    Order Specific Question:   Reason for Exam (SYMPTOM  OR DIAGNOSIS REQUIRED)    Answer:   Cough    Order  Specific Question:   Is patient pregnant?    Answer:   No    Order Specific Question:   Preferred imaging location?    Answer:   Internal    Order Specific Question:   Radiology Contrast Protocol - do NOT remove file path    Answer:   [email or web address]  . CBC with Differential/Platelet    Meds ordered this encounter  Medications  . predniSONE (DELTASONE) 20 MG tablet    Sig: Take 2 tablets (40 mg total) by mouth daily with breakfast.    Dispense:  10 tablet    Refill:  0  . levofloxacin (LEVAQUIN) 500 MG tablet    Sig: Take 1 tablet (500 mg total) by mouth daily.    Dispense:  7 tablet    Refill:  0    Laroy Apple, MD [facility] 04/25/2017, 2:15 PM

## 2017-04-25 NOTE — Patient Instructions (Addendum)
Great to see you!  Call or come back with any concerns  Finish all antibiotics  Try prednisone for your cough

## 2017-04-26 LAB — CBC WITH DIFFERENTIAL/PLATELET
Basophils Absolute: 0 10*3/uL (ref 0.0–0.2)
Basos: 0 %
EOS (ABSOLUTE): 0 10*3/uL (ref 0.0–0.4)
EOS: 0 %
HEMATOCRIT: 31.4 % — AB (ref 34.0–46.6)
HEMOGLOBIN: 10.7 g/dL — AB (ref 11.1–15.9)
Immature Grans (Abs): 0 10*3/uL (ref 0.0–0.1)
Immature Granulocytes: 0 %
LYMPHS ABS: 2.8 10*3/uL (ref 0.7–3.1)
Lymphs: 27 %
MCH: 33.5 pg — ABNORMAL HIGH (ref 26.6–33.0)
MCHC: 34.1 g/dL (ref 31.5–35.7)
MCV: 98 fL — ABNORMAL HIGH (ref 79–97)
MONOCYTES: 7 %
Monocytes Absolute: 0.7 10*3/uL (ref 0.1–0.9)
Neutrophils Absolute: 6.7 10*3/uL (ref 1.4–7.0)
Neutrophils: 66 %
Platelets: 117 10*3/uL — ABNORMAL LOW (ref 150–379)
RBC: 3.19 x10E6/uL — AB (ref 3.77–5.28)
RDW: 14.3 % (ref 12.3–15.4)
WBC: 10.2 10*3/uL (ref 3.4–10.8)

## 2017-04-27 ENCOUNTER — Other Ambulatory Visit: Payer: Self-pay | Admitting: Hematology and Oncology

## 2017-04-27 DIAGNOSIS — C541 Malignant neoplasm of endometrium: Secondary | ICD-10-CM

## 2017-05-10 ENCOUNTER — Other Ambulatory Visit: Payer: Self-pay | Admitting: Radiology

## 2017-05-11 ENCOUNTER — Other Ambulatory Visit: Payer: Self-pay | Admitting: Radiology

## 2017-05-12 ENCOUNTER — Ambulatory Visit (HOSPITAL_COMMUNITY)
Admission: RE | Admit: 2017-05-12 | Discharge: 2017-05-12 | Disposition: A | Discharge status: Home / Self Care | Payer: BLUE CROSS/BLUE SHIELD | Source: Ambulatory Visit | Attending: Hematology and Oncology | Admitting: Hematology and Oncology

## 2017-05-12 ENCOUNTER — Encounter (HOSPITAL_COMMUNITY): Payer: Self-pay

## 2017-05-12 ENCOUNTER — Other Ambulatory Visit: Payer: Self-pay | Admitting: Hematology and Oncology

## 2017-05-12 DIAGNOSIS — Z7984 Long term (current) use of oral hypoglycemic drugs: Secondary | ICD-10-CM | POA: Diagnosis not present

## 2017-05-12 DIAGNOSIS — Z8542 Personal history of malignant neoplasm of other parts of uterus: Secondary | ICD-10-CM | POA: Diagnosis not present

## 2017-05-12 DIAGNOSIS — Z7952 Long term (current) use of systemic steroids: Secondary | ICD-10-CM | POA: Insufficient documentation

## 2017-05-12 DIAGNOSIS — F419 Anxiety disorder, unspecified: Secondary | ICD-10-CM | POA: Insufficient documentation

## 2017-05-12 DIAGNOSIS — Z85828 Personal history of other malignant neoplasm of skin: Secondary | ICD-10-CM | POA: Diagnosis not present

## 2017-05-12 DIAGNOSIS — M1990 Unspecified osteoarthritis, unspecified site: Secondary | ICD-10-CM | POA: Insufficient documentation

## 2017-05-12 DIAGNOSIS — E119 Type 2 diabetes mellitus without complications: Secondary | ICD-10-CM | POA: Insufficient documentation

## 2017-05-12 DIAGNOSIS — C541 Malignant neoplasm of endometrium: Secondary | ICD-10-CM

## 2017-05-12 DIAGNOSIS — Z452 Encounter for adjustment and management of vascular access device: Secondary | ICD-10-CM | POA: Diagnosis not present

## 2017-05-12 HISTORY — PX: IR REMOVAL TUN ACCESS W/ PORT W/O FL MOD SED: IMG2290

## 2017-05-12 LAB — CBC WITH DIFFERENTIAL/PLATELET
BASOS ABS: 0 10*3/uL (ref 0.0–0.1)
BASOS PCT: 0 %
Eosinophils Absolute: 0.1 10*3/uL (ref 0.0–0.7)
Eosinophils Relative: 1 %
HEMATOCRIT: 35 % — AB (ref 36.0–46.0)
HEMOGLOBIN: 12.1 g/dL (ref 12.0–15.0)
LYMPHS PCT: 25 %
Lymphs Abs: 2 10*3/uL (ref 0.7–4.0)
MCH: 33.2 pg (ref 26.0–34.0)
MCHC: 34.6 g/dL (ref 30.0–36.0)
MCV: 95.9 fL (ref 78.0–100.0)
MONOS PCT: 6 %
Monocytes Absolute: 0.5 10*3/uL (ref 0.1–1.0)
NEUTROS ABS: 5.3 10*3/uL (ref 1.7–7.7)
NEUTROS PCT: 68 %
Platelets: 192 10*3/uL (ref 150–400)
RBC: 3.65 MIL/uL — ABNORMAL LOW (ref 3.87–5.11)
RDW: 14 % (ref 11.5–15.5)
WBC: 7.8 10*3/uL (ref 4.0–10.5)

## 2017-05-12 LAB — PROTIME-INR
INR: 0.91
Prothrombin Time: 12.1 seconds (ref 11.4–15.2)

## 2017-05-12 LAB — GLUCOSE, CAPILLARY: GLUCOSE-CAPILLARY: 181 mg/dL — AB (ref 65–99)

## 2017-05-12 MED ORDER — FENTANYL CITRATE (PF) 100 MCG/2ML IJ SOLN
INTRAMUSCULAR | Status: AC
  Filled 2017-05-12: qty 4

## 2017-05-12 MED ORDER — HYDROCODONE-ACETAMINOPHEN 5-325 MG PO TABS
1.0000 | ORAL_TABLET | Freq: Once | ORAL | Status: AC
  Administered 2017-05-12: 1 via ORAL
  Filled 2017-05-12: qty 1

## 2017-05-12 MED ORDER — MIDAZOLAM HCL 2 MG/2ML IJ SOLN
INTRAMUSCULAR | Status: AC
  Filled 2017-05-12: qty 4

## 2017-05-12 MED ORDER — FENTANYL CITRATE (PF) 100 MCG/2ML IJ SOLN
INTRAMUSCULAR | Status: AC | PRN
  Administered 2017-05-12: 25 ug via INTRAVENOUS
  Administered 2017-05-12: 50 ug via INTRAVENOUS

## 2017-05-12 MED ORDER — LIDOCAINE HCL 1 % IJ SOLN
INTRAMUSCULAR | Status: AC | PRN
  Administered 2017-05-12: 10 mL

## 2017-05-12 MED ORDER — MIDAZOLAM HCL 2 MG/2ML IJ SOLN
INTRAMUSCULAR | Status: AC | PRN
  Administered 2017-05-12 (×2): 1 mg via INTRAVENOUS

## 2017-05-12 MED ORDER — CEFAZOLIN SODIUM-DEXTROSE 2-4 GM/100ML-% IV SOLN
INTRAVENOUS | Status: AC
  Filled 2017-05-12: qty 100

## 2017-05-12 MED ORDER — LIDOCAINE HCL 1 % IJ SOLN
INTRAMUSCULAR | Status: AC
  Filled 2017-05-12: qty 20

## 2017-05-12 MED ORDER — SODIUM CHLORIDE 0.9 % IV SOLN
INTRAVENOUS | Status: DC
  Administered 2017-05-12: 10:00:00 via INTRAVENOUS

## 2017-05-12 MED ORDER — CEFAZOLIN SODIUM-DEXTROSE 2-4 GM/100ML-% IV SOLN
2.0000 g | INTRAVENOUS | Status: AC
  Administered 2017-05-12: 2 g via INTRAVENOUS

## 2017-05-12 NOTE — Discharge Instructions (Signed)
Moderate Conscious Sedation, Adult, Care After These instructions provide you with information about caring for yourself after your procedure. Your health care provider may also give you more specific instructions. Your treatment has been planned according to current medical practices, but problems sometimes occur. Call your health care provider if you have any problems or questions after your procedure. What can I expect after the procedure? After your procedure, it is common:  To feel sleepy for several hours.  To feel clumsy and have poor balance for several hours.  To have poor judgment for several hours.  To vomit if you eat too soon.  Follow these instructions at home: For at least 24 hours after the procedure:   Do not: ? Participate in activities where you could fall or become injured. ? Drive. ? Use heavy machinery. ? Drink alcohol. ? Take sleeping pills or medicines that cause drowsiness. ? Make important decisions or sign legal documents. ? Take care of children on your own.  Rest. Eating and drinking  Follow the diet recommended by your health care provider.  If you vomit: ? Drink water, juice, or soup when you can drink without vomiting. ? Make sure you have little or no nausea before eating solid foods. General instructions  Have a responsible adult stay with you until you are awake and alert.  Take over-the-counter and prescription medicines only as told by your health care provider.  If you smoke, do not smoke without supervision.  Keep all follow-up visits as told by your health care provider. This is important. Contact a health care provider if:  You keep feeling nauseous or you keep vomiting.  You feel light-headed.  You develop a rash.  You have a fever. Get help right away if:  You have trouble breathing. This information is not intended to replace advice given to you by your health care provider. Make sure you discuss any questions you have  with your health care provider. Document Released: 05/23/2013 Document Revised: 01/05/2016 Document Reviewed: 11/22/2015 Elsevier Interactive Patient Education  2018 [place].   Incision , Care After Refer to this sheet in the next few weeks. These instructions provide you with information about caring for yourself after your procedure. Your health care provider may also give you more specific instructions. Your treatment has been planned according to current medical practices, but problems sometimes occur. Call your health care provider if you have any problems or questions after your procedure. What can I expect after the procedure? After the procedure, it is common to have:  Pain or discomfort around your incision site.  Drainage from your incision.  Follow these instructions at home:  Take over-the-counter and prescription medicines only as told by your health care provider.  If you were prescribed an antibiotic medicine, take it as told by your health care provider.Do not stop taking the antibiotic even if you start to feel better.  Followinstructions from your health care provider about: ? How to take care of your incision. ? When and how you should change your packing and bandage (dressing). Wash your hands with soap and water before you change your dressing. If soap and water are not available, use hand sanitizer. ? When you should remove your dressing.  Do not take baths, swim, or use a hot tub until your health care provider approves.  Keep all follow-up visits as told by your health care provider. This is important.  Check your incision area every day for signs of infection. Check for: ?  More redness, swelling, or pain. ? More fluid or blood. ? Warmth. ? Pus or a bad smell. Contact a health care provider if:  Your cyst or abscess returns.  You have a fever.  You have more redness, swelling, or pain around your incision.  You have more fluid or blood coming  from your incision.  Your incision feels warm to the touch.  You have pus or a bad smell coming from your incision. Get help right away if:  You have severe pain or bleeding.  You cannot eat or drink without vomiting.  You have decreased urine output.  You become short of breath.  You have chest pain.  You cough up blood.  The area where the incision and drainage occurred becomes numb or it tingles. This information is not intended to replace advice given to you by your health care provider. Make sure you discuss any questions you have with your health care provider. Document Released: 10/25/2011 Document Revised: 01/02/2016 Document Reviewed: 05/23/2015 Elsevier Interactive Patient Education  2017 [place].  May remove dressing in 48 hours and shower if incision is closed with glue. Glue will flake off on its own.  If steri strips applied, may remove dressing in 48 hours but cover with plastic to shower.  Keep clean and dry and report signs of infection to MD.  May remove dressing

## 2017-05-12 NOTE — Procedures (Signed)
Interventional Radiology Procedure Note  Procedure:  Port removal  Complications: None  Estimated Blood Loss: < 10 mL  Right chest port removed in entirety.  Incision closed after pocket irrigated.  Venetia Night. Kathlene Cote, M.D Pager:  [phone]

## 2017-05-12 NOTE — H&P (Signed)
Referring Physician(s): Heath Lark  Supervising Physician: Aletta Edouard  Patient Status:  [name] OP  Chief Complaint:  "I'm getting my port out"  Subjective: Patient familiar to IR service from prior Port-A-Cath placement on 12/15/16. She has a history of endometrial carcinoma and has completed treatment. Request now received for Port-A-Cath removal. She currently denies fever, headache, chest pain, worsening dyspnea, abdominal/back pain, nausea, vomiting or abnormal bleeding. She does have occasional hoarseness and intermittent coughing. She continues to smoke. She is anxious.  Past Medical History:  Diagnosis Date  . Anxiety   . Arthritis   . Basal cell carcinoma    face  . Diabetes mellitus without complication ([place])    type II   . Endometrial cancer ([place])   . History of bronchitis    Past Surgical History:  Procedure Laterality Date  . BASAL CELL CARCINOMA EXCISION     face  . GANGLION CYST EXCISION Left 08/06/2014   Procedure: EXCISION LEFT  WRIST GANGLION;  Surgeon: Leanora Cover, MD;  Location: [place];  Service: Orthopedics;  Laterality: Left;  . IR FLUORO GUIDE PORT INSERTION RIGHT  12/15/2016  . IR US GUIDE VASC ACCESS RIGHT  12/15/2016  . KNEE SURGERY     right x3  . ROBOTIC ASSISTED TOTAL HYSTERECTOMY WITH BILATERAL SALPINGO OOPHERECTOMY Bilateral 11/16/2016   Procedure: XI ROBOTIC ASSISTED TOTAL HYSTERECTOMY WITH BILATERAL SALPINGO OOPHORECTOMY;  Surgeon: [name], MD;  Location: [facility];  Service: Gynecology;  Laterality: Bilateral;  . SENTINEL NODE BIOPSY N/A 11/16/2016   Procedure: SENTINEL NODE BIOPSY;  Surgeon: [name], MD;  Location: [facility];  Service: Gynecology;  Laterality: N/A;  . TOOTH EXTRACTION        Allergies: Patient has no known allergies.  Medications: Prior to Admission medications   Medication Sig Start Date End Date Taking? Authorizing Provider  amoxicillin-clavulanate (AUGMENTIN) 875-125 MG tablet Take 1 tablet by mouth  2 (two) times daily. 04/20/17  Yes Hawks, Christy A, FNP  escitalopram (LEXAPRO) 10 MG tablet Take 1 tablet (10 mg total) by mouth daily. 02/23/17  Yes Gorsuch, Ni, MD  loratadine (CLARITIN) 10 MG tablet Take 10 mg by mouth daily.   Yes [provider]  metFORMIN (GLUCOPHAGE-XR) 500 MG 24 hr tablet Take 2 tablets (1,000 mg total) by mouth daily. 03/23/17  Yes Gorsuch, Ni, MD  ondansetron (ZOFRAN) 8 MG tablet Take 1 tablet (8 mg total) by mouth 2 (two) times daily as needed for refractory nausea / vomiting. Start on day 3 after chemo. 03/23/17  Yes Gorsuch, Ni, MD  prochlorperazine (COMPAZINE) 10 MG tablet TAKE 1 TABLET BY MOUTH EVERY 6 HOURS AS NEEDED FOR NAUSEA OR VOMITING 03/20/17  Yes Alvy Bimler, Ni, MD  Vitamin D, Ergocalciferol, (DRISDOL) 50000 units CAPS capsule Take 1 tablet every week for 12 weeks 04/15/17  Yes Gorsuch, Ni, MD  cyclobenzaprine (FLEXERIL) 10 MG tablet Take 10 mg by mouth as needed for muscle spasms.    [provider]  dexamethasone (DECADRON) 4 MG tablet Take 5 tablets at midnight and 5 tablets at 6 am on the day of chemotherapy, every 3 weeks 02/02/17   Heath Lark, MD  ibuprofen (IBU) 600 MG tablet Take 1 tablet (600 mg total) by mouth every 6 (six) hours as needed. 12/17/16   Heath Lark, MD  levofloxacin (LEVAQUIN) 500 MG tablet Take 1 tablet (500 mg total) by mouth daily. 04/25/17   Timmothy Euler, MD  lidocaine-prilocaine (EMLA) cream Apply to affected area once 12/15/16  Heath Lark, MD  oxyCODONE-acetaminophen (PERCOCET/ROXICET) 5-325 MG tablet Take 1-2 tablets by mouth every 4 (four) hours as needed (moderate to severe pain (when tolerating fluids)). 11/17/16   [name], MD  predniSONE (DELTASONE) 20 MG tablet Take 2 tablets (40 mg total) by mouth daily with breakfast. 04/25/17   Timmothy Euler, MD  senna (SENOKOT) 8.6 MG TABS tablet Take 1 tablet (8.6 mg total) by mouth at bedtime. 11/17/16   [name], MD     Vital Signs: BP 122/77   Pulse (!) 102    Temp 98 F (36.7 C) (Oral)   Resp 18   Ht 5\' 8"  (1.727 m)   Wt 150 lb (68 kg)   LMP 07/25/2014 Comment: perimenopausal  SpO2 98%   BMI 22.81 kg/m   Physical Exam  awake, alert. Chest clear to auscultation bilaterally. Clean, intact right chest wall Port-A-Cath. Heart with slightly tachycardic but regular rhythm. Abdomen soft, positive bowel sounds, nontender. No lower extremity edema.  Imaging: No results found.  Labs:  CBC:  Recent Labs  02/23/17 0817 03/23/17 0836 04/13/17 0849 04/25/17 1406  WBC 4.1 6.9 5.7 10.2  HGB 12.6 12.3 11.9 10.7*  HCT 37.6 36.0 34.4* 31.4*  PLT 276 222 176 117*    COAGS:  Recent Labs  12/15/16 1148  INR 0.98    BMP:  Recent Labs  11/05/16 1017 11/16/16 1700 11/17/16 0451  02/02/17 0849 02/23/17 0817 03/23/17 0836 04/13/17 0849  NA 139  --  139  < > 133* 136 137 136  K 3.9  --  4.1  < > 4.9 4.1 3.9 3.7  CL 108  --  105  --   --   --   --   --   CO2 26  --  27  < > 19* 22 20* 23  GLUCOSE 123*  --  200*  < > 650* 442* 407* 355*  BUN 10  --  7  < > 17.8 10.9 12.4 13.9  CALCIUM 9.2  --  8.8*  < > 9.9 9.8 9.7 9.6  CREATININE 0.66 0.71 0.72  < > 1.0 0.9 0.9 0.8  GFRNONAA >60 >60 >60  --   --   --   --   --   GFRAA >60 >60 >60  --   --   --   --   --   < > = values in this interval not displayed.  LIVER FUNCTION TESTS:  Recent Labs  02/02/17 0849 02/23/17 0817 03/23/17 0836 04/13/17 0849  BILITOT 0.95 0.64 0.73 0.58  AST 22 11 17 14   ALT 40 25 28 22   ALKPHOS 167* 126 115 116  PROT 7.3 7.1 6.9 7.1  ALBUMIN 4.1 3.9 3.8 3.8    Assessment and Plan: Patient with prior history of endometrial carcinoma, status post treatment. She presents today for Port-A-Cath removal. Details/risks of procedure, including but not limited to, internal bleeding, infection, injury to adjacent structures discussed with patient and family with their understanding and consent.   Electronically Signed: D. Rowe Robert, PA-C 05/12/2017, 10:15  AM   I spent a total of 20 minutes at the the patient's bedside AND on the patient's hospital floor or unit, greater than 50% of which was counseling/coordinating care for Port-A-Cath removal

## 2017-05-27 ENCOUNTER — Other Ambulatory Visit: Payer: Self-pay | Admitting: Hematology and Oncology

## 2017-05-27 DIAGNOSIS — F411 Generalized anxiety disorder: Secondary | ICD-10-CM

## 2017-05-27 NOTE — Telephone Encounter (Signed)
Future refills with PCP

## 2017-06-10 ENCOUNTER — Telehealth: Payer: Self-pay | Admitting: *Deleted

## 2017-06-10 MED ORDER — METFORMIN HCL ER 500 MG PO TB24: 1000.0000 mg | ORAL_TABLET | Freq: Every day | ORAL | 0 refills | Status: DC

## 2017-06-10 NOTE — Telephone Encounter (Signed)
[name] states she is completely out of metformin. Has not seen PCP. States blood sugar has been better than when she was on chemo and steroids. Refill given for # 30 tablets - she is going to take 1 per day this weekend will call Dr Alvy Bimler on Monday for clarification. Instructed her to get in touch with PCP and make an appt so he can manage her diabetes. Verbalized understanding.

## 2017-06-30 DIAGNOSIS — Z008 Encounter for other general examination: Secondary | ICD-10-CM | POA: Diagnosis not present

## 2017-07-01 ENCOUNTER — Telehealth: Payer: Self-pay | Admitting: *Deleted

## 2017-07-01 NOTE — Telephone Encounter (Signed)
Patient called and moved her appt from December 3rd to December 5th at 12:15pm

## 2017-07-18 ENCOUNTER — Ambulatory Visit: Payer: BLUE CROSS/BLUE SHIELD | Admitting: Gynecologic Oncology

## 2017-07-20 ENCOUNTER — Encounter: Payer: Self-pay | Admitting: Gynecologic Oncology

## 2017-07-20 ENCOUNTER — Telehealth: Payer: Self-pay | Admitting: *Deleted

## 2017-07-20 ENCOUNTER — Ambulatory Visit: Payer: BLUE CROSS/BLUE SHIELD | Attending: Gynecologic Oncology | Admitting: Gynecologic Oncology

## 2017-07-20 VITALS — BP 128/71 | HR 81 | Temp 97.8°F | Resp 20 | Ht 68.0 in | Wt 153.2 lb

## 2017-07-20 DIAGNOSIS — E119 Type 2 diabetes mellitus without complications: Secondary | ICD-10-CM | POA: Diagnosis not present

## 2017-07-20 DIAGNOSIS — Z90710 Acquired absence of both cervix and uterus: Secondary | ICD-10-CM | POA: Diagnosis not present

## 2017-07-20 DIAGNOSIS — Z90722 Acquired absence of ovaries, bilateral: Secondary | ICD-10-CM

## 2017-07-20 DIAGNOSIS — C541 Malignant neoplasm of endometrium: Secondary | ICD-10-CM | POA: Diagnosis not present

## 2017-07-20 DIAGNOSIS — Z79899 Other long term (current) drug therapy: Secondary | ICD-10-CM | POA: Diagnosis not present

## 2017-07-20 DIAGNOSIS — Z923 Personal history of irradiation: Secondary | ICD-10-CM | POA: Diagnosis not present

## 2017-07-20 DIAGNOSIS — F419 Anxiety disorder, unspecified: Secondary | ICD-10-CM | POA: Insufficient documentation

## 2017-07-20 DIAGNOSIS — Z9221 Personal history of antineoplastic chemotherapy: Secondary | ICD-10-CM

## 2017-07-20 DIAGNOSIS — Z7984 Long term (current) use of oral hypoglycemic drugs: Secondary | ICD-10-CM | POA: Diagnosis not present

## 2017-07-20 DIAGNOSIS — Z85828 Personal history of other malignant neoplasm of skin: Secondary | ICD-10-CM | POA: Diagnosis not present

## 2017-07-20 DIAGNOSIS — Z8542 Personal history of malignant neoplasm of other parts of uterus: Secondary | ICD-10-CM

## 2017-07-20 DIAGNOSIS — F17210 Nicotine dependence, cigarettes, uncomplicated: Secondary | ICD-10-CM | POA: Insufficient documentation

## 2017-07-20 NOTE — Progress Notes (Signed)
Follow-up Note: Gyn-Onc  Consult was initially requested by Dr. Radene Knee for the evaluation of [name] 51 y.o. female  CC:  Chief Complaint  Patient presents with  . Endometrial cancer [facility])    Assessment/Plan:  Ms. [name]  is a 51 y.o.  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  I will see [name] back in June, 2019. She will see Dr Sondra Come for follow-up in March, 2019.   HPI: [name] is a 51 year old G0 who is seen in consultation at the request of Dr Radene Knee for grade 1 endometrial cancer.  The patient reported post-menopausal bleeding to her physician who performed a TVUS on 10/13/16. This showed a uterus measuring 7.6x4.6x 6.26cm.  The endometrial lining measuringed 2.24cm. The ovaries were normal. A 4cm intracavitary mass was appreciated on [place].  The patient then underwent endometrial pipelle biopsy on 10/13/16. It revealed an endometrioid adenocarcinoma, (FIGO grade 1).   Interval Hx:  On 11/16/16 she underwent robotic assisted total hysterectomy, BSO, sentinel lymph node biopsy. Final pathology confirmed a stage IIIA grade 1 endometrial cancer with a 5.2cm tumor with 1.2 of 2cm myometrial invasion, no LVSI, and metastases in the right fallopian tube and ovary. The cervix and lymph nodes were benign.  She did well postoperatively with no complaints.  A post-op CT of the chest, abdomen and pelvis showed no gross macroscopic metastatic disease.  She was recommended to receive adjuvant therapy with chemotherapy with 6 cycles of carboplatin paclitaxel with Dr. go such between May 2018 in August 2018.  She also received vaginal cuff brachytherapy, 30 Gy delivered in 5 fractions between the date 01/27/2017 and 02/24/17.  She tolerated adjuvant therapy well with no complaints.  She has no symptoms  consistent of recurrence.  Current Meds:  Outpatient Encounter Medications as of 07/20/2017  Medication Sig  . cyclobenzaprine (FLEXERIL) 10 MG tablet Take 10 mg by mouth as needed for muscle spasms.  Marland Kitchen escitalopram (LEXAPRO) 10 MG tablet TAKE 1 TABLET BY MOUTH ONCE DAILY  . metFORMIN (GLUCOPHAGE-XR) 500 MG 24 hr tablet Take 2 tablets (1,000 mg total) by mouth daily.  . [DISCONTINUED] amoxicillin-clavulanate (AUGMENTIN) 875-125 MG tablet Take 1 tablet by mouth 2 (two) times daily.  . [DISCONTINUED] dexamethasone (DECADRON) 4 MG tablet Take 5 tablets at midnight and 5 tablets at 6 am on the day of chemotherapy, every 3 weeks  . [DISCONTINUED] ibuprofen (IBU) 600 MG tablet Take 1 tablet (600 mg total) by mouth every 6 (six) hours as needed.  . [DISCONTINUED] levofloxacin (LEVAQUIN) 500 MG tablet Take 1 tablet (500 mg total) by mouth daily.  . [DISCONTINUED] lidocaine-prilocaine (EMLA) cream Apply to affected area once  . [DISCONTINUED] loratadine (CLARITIN) 10 MG tablet Take 10 mg by mouth daily.  . [DISCONTINUED] ondansetron (ZOFRAN) 8 MG tablet Take 1 tablet (8 mg total) by mouth 2 (two) times daily as needed for refractory nausea / vomiting. Start on day 3 after chemo.  . [DISCONTINUED] oxyCODONE-acetaminophen (PERCOCET/ROXICET) 5-325 MG tablet Take 1-2 tablets by mouth every 4 (four) hours as needed (moderate to severe pain (when tolerating fluids)).  . [DISCONTINUED] predniSONE (DELTASONE) 20 MG tablet Take 2 tablets (40 mg total) by mouth daily with breakfast.  . [DISCONTINUED] prochlorperazine (COMPAZINE) 10 MG tablet TAKE 1 TABLET BY MOUTH EVERY 6 HOURS AS NEEDED FOR NAUSEA OR VOMITING  . [DISCONTINUED] senna (SENOKOT) 8.6  MG TABS tablet Take 1 tablet (8.6 mg total) by mouth at bedtime.  . [DISCONTINUED] Vitamin D, Ergocalciferol, (DRISDOL) 50000 units CAPS capsule Take 1 tablet every week for 12 weeks   No facility-administered encounter medications on file as of 07/20/2017.     Allergy:  No Known Allergies  Social Hx:   Social History   Socioeconomic History  . Marital status: Married    Spouse name: Aaron Edelman  . Number of children: 0  . Years of education: Not on file  . Highest education level: Not on file  Social Needs  . Financial resource strain: Not on file  . Food insecurity - worry: Not on file  . Food insecurity - inability: Not on file  . Transportation needs - medical: Not on file  . Transportation needs - non-medical: Not on file  Occupational History  . Occupation: Designer, multimedia  Tobacco Use  . Smoking status: Current Every Day Smoker    Packs/day: 0.50    Years: 34.00    Pack years: 17.00    Types: Cigarettes  . Smokeless tobacco: Never Used  Substance and Sexual Activity  . Alcohol use: No  . Drug use: No  . Sexual activity: Yes  Other Topics Concern  . Not on file  Social History Narrative  . Not on file    Past Surgical Hx:  Past Surgical History:  Procedure Laterality Date  . BASAL CELL CARCINOMA EXCISION     face  . GANGLION CYST EXCISION Left 08/06/2014   Procedure: EXCISION LEFT  WRIST GANGLION;  Surgeon: Leanora Cover, MD;  Location: [facility];  Service: Orthopedics;  Laterality: Left;  . IR FLUORO GUIDE PORT INSERTION RIGHT  12/15/2016  . IR REMOVAL TUN ACCESS W/ PORT W/O FL MOD SED  05/12/2017  . IR US GUIDE VASC ACCESS RIGHT  12/15/2016  . KNEE SURGERY     right x3  . ROBOTIC ASSISTED TOTAL HYSTERECTOMY WITH BILATERAL SALPINGO OOPHERECTOMY Bilateral 11/16/2016   Procedure: XI ROBOTIC ASSISTED TOTAL HYSTERECTOMY WITH BILATERAL SALPINGO OOPHORECTOMY;  Surgeon: [name], MD;  Location: [facility];  Service: Gynecology;  Laterality: Bilateral;  . SENTINEL NODE BIOPSY N/A 11/16/2016   Procedure: SENTINEL NODE BIOPSY;  Surgeon: [name], MD;  Location: [facility];  Service: Gynecology;  Laterality: N/A;  . TOOTH EXTRACTION      Past Medical Hx:  Past Medical History:  Diagnosis Date  . Anxiety   . Arthritis   . Basal cell  carcinoma    face  . Diabetes mellitus without complication ([place])    type II   . Endometrial cancer ([place])   . History of bronchitis     Past Gynecological History:  G0 Patient's last menstrual period was 07/25/2014.  Family Hx:  Family History  Problem Relation Age of Onset  . Hypertension Mother   . Heart disease Father   . Cancer Father        lung ca  . Arthritis Sister        rheumatoid  . Cancer Brother        prostate    Review of Systems:  Constitutional  Feels well,    ENT Normal appearing ears and nares bilaterally Skin/Breast  No rash, sores, jaundice, itching, dryness Cardiovascular  No chest pain, shortness of breath, or edema  Pulmonary  No cough or wheeze.  Gastro Intestinal  No nausea, vomitting, or diarrhoea. No bright red blood per rectum, no abdominal pain, change in bowel movement, or  constipation.  Genito Urinary  No frequency, urgency, dysuria, no bleeding Musculo Skeletal  No myalgia, arthralgia, joint swelling or pain  Neurologic  No weakness, numbness, change in gait,  Psychology  No depression, anxiety, insomnia.   Vitals:  Blood pressure 128/71, pulse 81, temperature 97.8 F (36.6 C), temperature source Oral, resp. rate 20, height 5\' 8"  (1.727 m), weight 153 lb 3.2 oz (69.5 kg), last menstrual period 07/25/2014, SpO2 98 %.  Physical Exam: WD in NAD Neck  Supple NROM, without any enlargements.  Lymph Node Survey No cervical supraclavicular or inguinal adenopathy Cardiovascular  Pulse normal rate, regularity and rhythm. S1 and S2 normal.  Lungs  Clear to auscultation bilateraly, without wheezes/crackles/rhonchi. Good air movement.  Skin  No rash/lesions/breakdown  Psychiatry  Alert and oriented to person, place, and time  Abdomen  Normoactive bowel sounds, abdomen soft, non-tender and thin without evidence of hernia. Incisions soft, no abdominal masses. Back No CVA tenderness Genito Urinary  Surgically absent uterus,  cervix. Vaginal cuff smooth, well healed. No lesions, no blood. Rectal  deferred Extremities  No bilateral cyanosis, clubbing or edema.   Donaciano Eva, MD  07/20/2017, 12:41 PM

## 2017-07-20 NOTE — Patient Instructions (Signed)
Please notify Dr Denman George at phone number [phone] if you notice vaginal bleeding, new pelvic or abdominal pains, bloating, feeling full easy, or a change in bladder or bowel function.   Please return to see Dr Sondra Come in March, 2019 and Dr Denman George in June, 2019

## 2017-07-20 NOTE — Telephone Encounter (Signed)
Called the patient and left a message with her appt date/time for Dr. Sondra Come on March 11th at 11am.

## 2017-07-20 NOTE — Progress Notes (Signed)
Ordered MSI testing on patient's surgical path.  Spoke with Suanne Marker at [facility].

## 2017-07-21 ENCOUNTER — Other Ambulatory Visit (HOSPITAL_COMMUNITY)
Admission: RE | Admit: 2017-07-21 | Discharge: 2017-07-21 | Disposition: A | Discharge status: Home / Self Care | Payer: BLUE CROSS/BLUE SHIELD | Source: Ambulatory Visit | Attending: Gynecologic Oncology | Admitting: Gynecologic Oncology

## 2017-07-21 DIAGNOSIS — C578 Malignant neoplasm of overlapping sites of female genital organs: Secondary | ICD-10-CM | POA: Diagnosis not present

## 2017-07-27 DIAGNOSIS — Z716 Tobacco abuse counseling: Secondary | ICD-10-CM | POA: Diagnosis not present

## 2017-07-27 DIAGNOSIS — F17210 Nicotine dependence, cigarettes, uncomplicated: Secondary | ICD-10-CM | POA: Diagnosis not present

## 2017-09-13 ENCOUNTER — Encounter: Payer: Self-pay | Admitting: Family Medicine

## 2017-09-13 ENCOUNTER — Encounter: Payer: Self-pay | Admitting: *Deleted

## 2017-09-13 ENCOUNTER — Ambulatory Visit: Payer: BLUE CROSS/BLUE SHIELD | Admitting: Family Medicine

## 2017-09-13 VITALS — BP 117/70 | HR 81 | Temp 97.8°F | Ht 67.0 in | Wt 158.0 lb

## 2017-09-13 DIAGNOSIS — Z23 Encounter for immunization: Secondary | ICD-10-CM | POA: Diagnosis not present

## 2017-09-13 DIAGNOSIS — L570 Actinic keratosis: Secondary | ICD-10-CM | POA: Diagnosis not present

## 2017-09-13 DIAGNOSIS — R350 Frequency of micturition: Secondary | ICD-10-CM | POA: Diagnosis not present

## 2017-09-13 DIAGNOSIS — L989 Disorder of the skin and subcutaneous tissue, unspecified: Secondary | ICD-10-CM | POA: Diagnosis not present

## 2017-09-13 LAB — URINALYSIS, COMPLETE
Bilirubin, UA: NEGATIVE
GLUCOSE, UA: NEGATIVE
Ketones, UA: NEGATIVE
LEUKOCYTES UA: NEGATIVE
Nitrite, UA: NEGATIVE
PH UA: 6 (ref 5.0–7.5)
PROTEIN UA: NEGATIVE
RBC, UA: NEGATIVE
Specific Gravity, UA: 1.01 (ref 1.005–1.030)
Urobilinogen, Ur: 0.2 mg/dL (ref 0.2–1.0)

## 2017-09-13 LAB — MICROSCOPIC EXAMINATION
BACTERIA UA: NONE SEEN
Epithelial Cells (non renal): NONE SEEN /hpf (ref 0–10)
RBC, UA: NONE SEEN /hpf (ref 0–?)
RENAL EPITHEL UA: NONE SEEN /HPF
WBC UA: NONE SEEN /HPF (ref 0–?)

## 2017-09-13 NOTE — Progress Notes (Addendum)
Subjective: CC: side pain PCP: [name], FNP LPF:[id number] [name] is a 52 y.o. female presenting to clinic today for:  1.  Flank pain Patient reports onset of left lower abdominal pain and pressure this morning.  She denies fevers, chills, nausea, vomiting, diarrhea.  She is tolerating p.o. intake without difficulty.  She reports increased urinary frequency.  Thinks that she has had a little bit more vaginal discharge than normal but it is clear.  Denies vaginal odors or abnormal vaginal bleeding.  No hematuria, urinary urgency, urinary retention.  2.  Skin lesion Patient reports a new skin lesion that appeared about a month ago.  She notes that it crusted over.  Denies spontaneous bleeding.  Denies itching.  Has not changed in size or shape but seems to be changing in texture.  Past medical history significant for uterine cancer.   ROS: Per HPI  No Known Allergies Past Medical History:  Diagnosis Date  . Anxiety   . Arthritis   . Basal cell carcinoma    face  . Diabetes mellitus without complication ([place])    type II   . Endometrial cancer ([place])   . History of bronchitis     Current Outpatient Medications:  .  cyclobenzaprine (FLEXERIL) 10 MG tablet, Take 10 mg by mouth as needed for muscle spasms., Disp: , Rfl:  .  escitalopram (LEXAPRO) 10 MG tablet, TAKE 1 TABLET BY MOUTH ONCE DAILY, Disp: 90 tablet, Rfl: 0 .  metFORMIN (GLUCOPHAGE-XR) 500 MG 24 hr tablet, Take 2 tablets (1,000 mg total) by mouth daily., Disp: 30 tablet, Rfl: 0 Social History   Socioeconomic History  . Marital status: Married    Spouse name: Aaron Edelman  . Number of children: 0  . Years of education: Not on file  . Highest education level: Not on file  Social Needs  . Financial resource strain: Not on file  . Food insecurity - worry: Not on file  . Food insecurity - inability: Not on file  . Transportation needs - medical: Not on file  . Transportation needs - non-medical: Not on file    Occupational History  . Occupation: Designer, multimedia  Tobacco Use  . Smoking status: Current Every Day Smoker    Packs/day: 0.50    Years: 34.00    Pack years: 17.00    Types: Cigarettes  . Smokeless tobacco: Never Used  Substance and Sexual Activity  . Alcohol use: No  . Drug use: No  . Sexual activity: Yes  Other Topics Concern  . Not on file  Social History Narrative  . Not on file   Family History  Problem Relation Age of Onset  . Hypertension Mother   . Heart disease Father   . Cancer Father        lung ca  . Arthritis Sister        rheumatoid  . Cancer Brother        prostate    Objective: Office vital signs reviewed. BP 117/70   Pulse 81   Temp 97.8 F (36.6 C) (Oral)   Ht 5\' 7"  (1.702 m)   Wt 158 lb (71.7 kg)   LMP 07/25/2014 Comment: perimenopausal  BMI 24.75 kg/m   Physical Examination:  General: Awake, alert, nontoxic-appearing, No acute distress HEENT: Sclera white, moist mucous membranes, poor dentition GI: soft, non-tender, non-distended, bowel sounds present x4, no hepatomegaly, no splenomegaly, no masses GU: No suprapubic tenderness to palpation.  No CVA tenderness to palpation. Skin: dry;  3.3mm  x 3.5 mm round, raised area of dry crusty skin.  There does appear to be hypervascularity underneath.  Lesion is nonbleeding, nonexudative and nontender to touch.  No surrounding erythema or induration.  Skin Biopsy Procedure:  Risks and benefits of procedure were reviewed with the patient.  Written consent scanned into chart.  Area of concern located on Left upper arm.  Area was cleaned with alcohol swabs x2.  Local anesthesia was achieved by injecting 2 cc of lidocaine 1% without epinephrine.  The area was then cleaned with alcohol swabs x2. Dermblade was used to biopsy lesion.  < 1cc Estimated blood loss.  Hemostasis achieved with pressure and silver nitrate.  Area was cleaned, antibiotic ointment applied, and a clean bandage was applied.  Patient  tolerated procedure well and there were no immediate complications.  Home care instructions were reviewed with the patient.   Assessment/ Plan: 52 y.o. female   1. Urinary frequency Urinalysis completely negative.  Urine microscopy negative for any evidence of infection or abnormality.  She may have urethral irritation.  I advised her to increase oral fluid intake.  Monitor for fevers and worsening symptoms.  CT abdomen pelvis from April 2018 report was reviewed which demonstrated absence of uterus and no evidence of metastatic disease.  Should she notice that urinary frequency or left lower abdominal pain is worsening or not resolving and there continues to be no evidence of UTI or GI infection, low threshold to obtain pelvic imaging in the setting of history of endometrial cancer. - Urinalysis, Complete  2. Skin lesion of left arm Sent for pathology.  No pigmentation.  Likely basal versus squamous cell. - Pathology  3. Need for immunization against influenza - Flu Vaccine QUAD 36+ mos IM   Orders Placed This Encounter  Procedures  . Flu Vaccine QUAD 36+ mos IM  . Urinalysis, Complete    Janora Norlander, [place] [phone]

## 2017-09-13 NOTE — Patient Instructions (Addendum)
You had a biopsy performed today.  I sent this off for evaluation under microscopy.  You will be contacted within the next week with results.  Your urinalysis was negative for any evidence of infection or abnormality today.  If your symptoms worsen or persist, please seek reevaluation.  Skin Biopsy, Care After Refer to this sheet in the next few weeks. These instructions provide you with information about caring for yourself after your procedure. Your health care provider may also give you more specific instructions. Your treatment has been planned according to current medical practices, but problems sometimes occur. Call your health care provider if you have any problems or questions after your procedure. What can I expect after the procedure? After the procedure, it is common to have:  Soreness.  Bruising.  Itching.  Follow these instructions at home:  Rest and then return to your normal activities as told by your health care provider.  Take over-the-counter and prescription medicines only as told by your health care provider.  Follow instructions from your health care provider about how to take care of your biopsy site.Make sure you: ? Wash your hands with soap and water before you change your bandage (dressing). If soap and water are not available, use hand sanitizer. ? Change your dressing as told by your health care provider. ? Leave stitches (sutures), skin glue, or adhesive strips in place. These skin closures may need to stay in place for 2 weeks or longer. If adhesive strip edges start to loosen and curl up, you may trim the loose edges. Do not remove adhesive strips completely unless your health care provider tells you to do that. If the biopsy area bleeds, apply gentle pressure for 10 minutes.  Check your biopsy site every day for signs of infection. Check for: ? More redness, swelling, or pain. ? More fluid or blood. ? Warmth. ? Pus or a bad smell.  Keep all follow-up  visits as told by your health care provider. This is important. Contact a health care provider if:  You have more redness, swelling, or pain around your biopsy site.  You have more fluid or blood coming from your biopsy site.  Your biopsy site feels warm to the touch.  You have pus or a bad smell coming from your biopsy site.  You have a fever. Get help right away if:  You have bleeding that does not stop with pressure or a dressing. This information is not intended to replace advice given to you by your health care provider. Make sure you discuss any questions you have with your health care provider. Document Released: 08/29/2015 Document Revised: 03/28/2016 Document Reviewed: 10/30/2014 Elsevier Interactive Patient Education  [employer].

## 2017-09-15 LAB — PATHOLOGY

## 2017-10-05 DIAGNOSIS — E119 Type 2 diabetes mellitus without complications: Secondary | ICD-10-CM | POA: Diagnosis not present

## 2017-10-05 DIAGNOSIS — F419 Anxiety disorder, unspecified: Secondary | ICD-10-CM | POA: Diagnosis not present

## 2017-10-05 DIAGNOSIS — F17210 Nicotine dependence, cigarettes, uncomplicated: Secondary | ICD-10-CM | POA: Diagnosis not present

## 2017-10-05 DIAGNOSIS — Z716 Tobacco abuse counseling: Secondary | ICD-10-CM | POA: Diagnosis not present

## 2017-10-19 DIAGNOSIS — F17210 Nicotine dependence, cigarettes, uncomplicated: Secondary | ICD-10-CM | POA: Diagnosis not present

## 2017-10-19 DIAGNOSIS — Z716 Tobacco abuse counseling: Secondary | ICD-10-CM | POA: Diagnosis not present

## 2017-10-19 DIAGNOSIS — E119 Type 2 diabetes mellitus without complications: Secondary | ICD-10-CM | POA: Diagnosis not present

## 2017-10-24 ENCOUNTER — Ambulatory Visit: Payer: Self-pay | Admitting: Radiation Oncology

## 2017-10-25 ENCOUNTER — Encounter: Payer: Self-pay | Admitting: Oncology

## 2017-10-27 ENCOUNTER — Other Ambulatory Visit: Payer: Self-pay

## 2017-10-27 ENCOUNTER — Ambulatory Visit
Admission: RE | Admit: 2017-10-27 | Discharge: 2017-10-27 | Disposition: A | Discharge status: Home / Self Care | Payer: BLUE CROSS/BLUE SHIELD | Source: Ambulatory Visit | Attending: Radiation Oncology | Admitting: Radiation Oncology

## 2017-10-27 ENCOUNTER — Encounter: Payer: Self-pay | Admitting: Radiation Oncology

## 2017-10-27 VITALS — BP 124/80 | HR 85 | Temp 97.5°F | Resp 18 | Wt 154.0 lb

## 2017-10-27 DIAGNOSIS — Z79899 Other long term (current) drug therapy: Secondary | ICD-10-CM | POA: Insufficient documentation

## 2017-10-27 DIAGNOSIS — Z8542 Personal history of malignant neoplasm of other parts of uterus: Secondary | ICD-10-CM | POA: Diagnosis not present

## 2017-10-27 DIAGNOSIS — C541 Malignant neoplasm of endometrium: Secondary | ICD-10-CM | POA: Insufficient documentation

## 2017-10-27 DIAGNOSIS — Z08 Encounter for follow-up examination after completed treatment for malignant neoplasm: Secondary | ICD-10-CM | POA: Diagnosis not present

## 2017-10-27 DIAGNOSIS — Z7984 Long term (current) use of oral hypoglycemic drugs: Secondary | ICD-10-CM | POA: Insufficient documentation

## 2017-10-27 NOTE — Progress Notes (Signed)
[name] is  Here for her follow-up appointment. Patient denies any pain or fatigue. Patient denies any issues with her bowels. Denies any burning with urination.Patient denies any nausea or vomiting. Patient denies any vaginal or rectal bleeding. Patient denies any vaginal discharge.Patient denies any skin irritation where she had radiation . Patient requested a dilator .States that it hurts a lot when she has exams. States that she is not sexually active. Vitals:   10/27/17 1104  BP: 124/80  Pulse: 85  Resp: 18  Temp: (!) 97.5 F (36.4 C)  TempSrc: Oral  SpO2: 99%  Weight: 154 lb (69.9 kg)   Wt Readings from Last 3 Encounters:  10/27/17 154 lb (69.9 kg)  09/13/17 158 lb (71.7 kg)  07/20/17 153 lb 3.2 oz (69.5 kg)

## 2017-10-27 NOTE — Progress Notes (Signed)

## 2017-10-27 NOTE — Progress Notes (Signed)
  Radiation Oncology         [phone] ________________________________  Name: [name] MRN: [id number]  Date: 10/27/2017  DOB: [birth year]  Follow-Up Visit Note  CC: [name], FNP  [name], MD    ICD-10-CM   1. Endometrial cancer (HCC) C54.1     Diagnosis: 52 y.o. female with Stage IIIA endometrial cancer (right ovarian involvement with deep myometrial uterine involvement).  Interval Since Last Radiation:  8 months 01/27/2017, 01/31/2017, 02/03/2017, 02/17/2017, 02/24/2017: 30 Gy to the vaginal cuff in 5 fractions  Narrative:  The patient returns today for routine follow-up. She missed her initial follow-up due to her brother passing away. She did not receive her vaginal dilator since she missed this follow-up appointment. She last saw Dr. Denman George on 07/20/17. Per her note, the patient had no evidence of disease on clinical exam.   The patient denies pain, fatigue, bowel issues, dysuria, nausea/vomiting, vaginal/rectal bleeding, or vaginal discharge. She reports pain during vaginal exams and requested a dilator.                               ALLERGIES:  has No Known Allergies.  Meds: Current Outpatient Medications  Medication Sig Dispense Refill  . cyclobenzaprine (FLEXERIL) 10 MG tablet Take 10 mg by mouth as needed for muscle spasms.    Marland Kitchen escitalopram (LEXAPRO) 10 MG tablet TAKE 1 TABLET BY MOUTH ONCE DAILY 90 tablet 0  . metFORMIN (GLUCOPHAGE-XR) 500 MG 24 hr tablet Take 2 tablets (1,000 mg total) by mouth daily. 30 tablet 0   No current facility-administered medications for this encounter.     Physical Findings: The patient is in no acute distress. Patient is alert and oriented.  weight is 154 lb (69.9 kg). Her oral temperature is 97.5 F (36.4 C) (abnormal). Her blood pressure is 124/80 and her pulse is 85. Her respiration is 18 and oxygen saturation is 99%. .  No significant changes. Lungs are clear to auscultation bilaterally. Heart has regular  rate and rhythm. No palpable cervical, supraclavicular, or axillary adenopathy. Abdomen soft, non-tender, normal bowel sounds. On pelvic examination the external genitalia were unremarkable. A speculum exam was performed. There are no mucosal lesions noted in the vaginal vault. On bimanual and rectovaginal examination there were no pelvic masses appreciated.   Lab Findings: Lab Results  Component Value Date   WBC 7.8 05/12/2017   HGB 12.1 05/12/2017   HCT 35.0 (L) 05/12/2017   MCV 95.9 05/12/2017   PLT 192 05/12/2017    Radiographic Findings: No results found.  Impression:   No evidence of recurrence on clinical exam.  Plan: Follow-up with Dr. Denman George on 01/18/18. Follow-up with radiation oncology in 6 months. The patient was given a vaginal dilator and instructions on its use.   ____________________________________   This document serves as a record of services personally performed by Gery Pray, MD. It was created on his behalf by Bethann Humble, a trained medical scribe. The creation of this record is based on the scribe's personal observations and the provider's statements to them. This document has been checked and approved by the attending provider.

## 2017-11-07 DIAGNOSIS — F17210 Nicotine dependence, cigarettes, uncomplicated: Secondary | ICD-10-CM | POA: Diagnosis not present

## 2017-11-07 DIAGNOSIS — Z716 Tobacco abuse counseling: Secondary | ICD-10-CM | POA: Diagnosis not present

## 2017-12-28 ENCOUNTER — Telehealth: Payer: Self-pay | Admitting: *Deleted

## 2017-12-28 NOTE — Telephone Encounter (Signed)
Called and left the patient a message to call the office back. Need to move the appt from June 5th to another day, Dr. Harrington Challenger is in the OR

## 2017-12-29 ENCOUNTER — Telehealth: Payer: Self-pay | Admitting: *Deleted

## 2017-12-29 NOTE — Telephone Encounter (Signed)
Attempted to return the patient's call, left a message to call the office back  

## 2017-12-29 NOTE — Telephone Encounter (Signed)
Patient called back and moved her appt from June 5th to May 31st

## 2017-12-30 ENCOUNTER — Telehealth: Payer: Self-pay | Admitting: *Deleted

## 2017-12-30 NOTE — Telephone Encounter (Signed)
Patient called and rescheduled her appt from May 31st to June 28th

## 2018-01-13 ENCOUNTER — Ambulatory Visit: Payer: BLUE CROSS/BLUE SHIELD | Admitting: Gynecologic Oncology

## 2018-01-18 ENCOUNTER — Ambulatory Visit: Payer: BLUE CROSS/BLUE SHIELD | Admitting: Gynecologic Oncology

## 2018-01-24 DIAGNOSIS — Z1231 Encounter for screening mammogram for malignant neoplasm of breast: Secondary | ICD-10-CM | POA: Diagnosis not present

## 2018-01-24 LAB — HM MAMMOGRAPHY

## 2018-01-25 DIAGNOSIS — Z719 Counseling, unspecified: Secondary | ICD-10-CM | POA: Diagnosis not present

## 2018-01-25 DIAGNOSIS — E119 Type 2 diabetes mellitus without complications: Secondary | ICD-10-CM | POA: Diagnosis not present

## 2018-01-25 DIAGNOSIS — F17210 Nicotine dependence, cigarettes, uncomplicated: Secondary | ICD-10-CM | POA: Diagnosis not present

## 2018-01-25 DIAGNOSIS — Z716 Tobacco abuse counseling: Secondary | ICD-10-CM | POA: Diagnosis not present

## 2018-01-25 DIAGNOSIS — Z008 Encounter for other general examination: Secondary | ICD-10-CM | POA: Diagnosis not present

## 2018-02-09 ENCOUNTER — Telehealth: Payer: Self-pay | Admitting: *Deleted

## 2018-02-09 NOTE — Telephone Encounter (Signed)
Returned the patient's call and left a message to call the office back

## 2018-02-10 ENCOUNTER — Inpatient Hospital Stay: Payer: BLUE CROSS/BLUE SHIELD | Attending: Gynecologic Oncology | Admitting: Gynecologic Oncology

## 2018-02-10 ENCOUNTER — Encounter: Payer: Self-pay | Admitting: Gynecologic Oncology

## 2018-02-10 VITALS — BP 111/73 | HR 81 | Temp 98.2°F | Resp 20 | Ht 68.0 in | Wt 155.2 lb

## 2018-02-10 DIAGNOSIS — Z90722 Acquired absence of ovaries, bilateral: Secondary | ICD-10-CM | POA: Insufficient documentation

## 2018-02-10 DIAGNOSIS — F17210 Nicotine dependence, cigarettes, uncomplicated: Secondary | ICD-10-CM | POA: Diagnosis not present

## 2018-02-10 DIAGNOSIS — Z90710 Acquired absence of both cervix and uterus: Secondary | ICD-10-CM | POA: Insufficient documentation

## 2018-02-10 DIAGNOSIS — Z923 Personal history of irradiation: Secondary | ICD-10-CM | POA: Diagnosis not present

## 2018-02-10 DIAGNOSIS — Z9221 Personal history of antineoplastic chemotherapy: Secondary | ICD-10-CM | POA: Diagnosis not present

## 2018-02-10 DIAGNOSIS — C541 Malignant neoplasm of endometrium: Secondary | ICD-10-CM | POA: Diagnosis not present

## 2018-02-10 NOTE — Progress Notes (Signed)
Follow-up Note: Gyn-Onc  Consult was initially requested by Dr. Radene Knee for the evaluation of [name] 52 y.o. female  CC:  Chief Complaint  Patient presents with  . Endometrial cancer [facility])    Assessment/Plan:  [name]  is a 52 y.o.  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 Disease free on exam.  I will see [name] back in December, 2019. She will see Dr Sondra Come for follow-up in September, 2019.   HPI: [name] is a 52 year old G0 who is seen in consultation at the request of Dr Radene Knee for grade 1 endometrial cancer.  The patient reported post-menopausal bleeding to her physician who performed a TVUS on 10/13/16. This showed a uterus measuring 7.6x4.6x 6.26cm.  The endometrial lining measuringed 2.24cm. The ovaries were normal. A 4cm intracavitary mass was appreciated on [place].  The patient then underwent endometrial pipelle biopsy on 10/13/16. It revealed an endometrioid adenocarcinoma, (FIGO grade 1).   On 11/16/16 she underwent robotic assisted total hysterectomy, BSO, sentinel lymph node biopsy. Final pathology confirmed a stage IIIA grade 1 endometrial cancer with a 5.2cm tumor with 1.2 of 2cm myometrial invasion, no LVSI, and metastases in the right fallopian tube and ovary. The cervix and lymph nodes were benign. The tumor was MSI stable on IHC.  She did well postoperatively with no complaints.  A post-op CT of the chest, abdomen and pelvis showed no gross macroscopic metastatic disease.  She was recommended to receive adjuvant therapy with chemotherapy with 6 cycles of carboplatin paclitaxel with Dr. go such between May 2018 in August 2018.  She also received vaginal cuff brachytherapy, 30 Gy delivered in 5 fractions between the date 01/27/2017 and 02/24/17.  She tolerated adjuvant  therapy well with no complaints.    Interval Hx:  She has some left upper arm soreness that is residual at the site of her neulasta injections. She has no symptoms consistent of recurrence.  Current Meds:  Outpatient Encounter Medications as of 02/10/2018  Medication Sig  . cyclobenzaprine (FLEXERIL) 10 MG tablet Take 10 mg by mouth as needed for muscle spasms.  Marland Kitchen escitalopram (LEXAPRO) 10 MG tablet TAKE 1 TABLET BY MOUTH ONCE DAILY  . metFORMIN (GLUCOPHAGE-XR) 500 MG 24 hr tablet Take 2 tablets (1,000 mg total) by mouth daily.   No facility-administered encounter medications on file as of 02/10/2018.     Allergy: No Known Allergies  Social Hx:   Social History   Socioeconomic History  . Marital status: Married    Spouse name: Aaron Edelman  . Number of children: 0  . Years of education: Not on file  . Highest education level: Not on file  Occupational History  . Occupation: Designer, multimedia  Social Needs  . Financial resource strain: Not on file  . Food insecurity:    Worry: Not on file    Inability: Not on file  . Transportation needs:    Medical: Not on file    Non-medical: Not on file  Tobacco Use  . Smoking status: Current Every Day Smoker    Packs/day: 0.50    Years: 34.00    Pack years: 17.00    Types: Cigarettes  . Smokeless tobacco: Never Used  Substance and Sexual Activity  . Alcohol use: No  . Drug use: No  . Sexual activity: Yes  Lifestyle  . Physical activity:  Days per week: Not on file    Minutes per session: Not on file  . Stress: Not on file  Relationships  . Social connections:    Talks on phone: Not on file    Gets together: Not on file    Attends religious service: Not on file    Active member of club or organization: Not on file    Attends meetings of clubs or organizations: Not on file    Relationship status: Not on file  . Intimate partner violence:    Fear of current or ex partner: Not on file    Emotionally abused: Not on file     Physically abused: Not on file    Forced sexual activity: Not on file  Other Topics Concern  . Not on file  Social History Narrative  . Not on file    Past Surgical Hx:  Past Surgical History:  Procedure Laterality Date  . BASAL CELL CARCINOMA EXCISION     face  . GANGLION CYST EXCISION Left 08/06/2014   Procedure: EXCISION LEFT  WRIST GANGLION;  Surgeon: Leanora Cover, MD;  Location: [place];  Service: Orthopedics;  Laterality: Left;  . IR FLUORO GUIDE PORT INSERTION RIGHT  12/15/2016  . IR REMOVAL TUN ACCESS W/ PORT W/O FL MOD SED  05/12/2017  . IR US GUIDE VASC ACCESS RIGHT  12/15/2016  . KNEE SURGERY     right x3  . ROBOTIC ASSISTED TOTAL HYSTERECTOMY WITH BILATERAL SALPINGO OOPHERECTOMY Bilateral 11/16/2016   Procedure: XI ROBOTIC ASSISTED TOTAL HYSTERECTOMY WITH BILATERAL SALPINGO OOPHORECTOMY;  Surgeon: [name], MD;  Location: [facility];  Service: Gynecology;  Laterality: Bilateral;  . SENTINEL NODE BIOPSY N/A 11/16/2016   Procedure: SENTINEL NODE BIOPSY;  Surgeon: [name], MD;  Location: [facility];  Service: Gynecology;  Laterality: N/A;  . TOOTH EXTRACTION      Past Medical Hx:  Past Medical History:  Diagnosis Date  . Anxiety   . Arthritis   . Basal cell carcinoma    face  . Diabetes mellitus without complication ([place])    type II   . Endometrial cancer ([place])   . History of bronchitis   . History of radiation therapy 01/27/17-02/24/17   vaginal brachytherapy to vaginal cuff 30 Gy in 5 fractions    Past Gynecological History:  G0 Patient's last menstrual period was 07/25/2014.  Family Hx:  Family History  Problem Relation Age of Onset  . Hypertension Mother   . Heart disease Father   . Cancer Father        lung ca  . Arthritis Sister        rheumatoid  . Cancer Brother        prostate    Review of Systems:  Constitutional  Feels well,    ENT Normal appearing ears and nares bilaterally Skin/Breast  No rash, sores, jaundice, itching,  dryness Cardiovascular  No chest pain, shortness of breath, or edema  Pulmonary  No cough or wheeze.  Gastro Intestinal  No nausea, vomitting, or diarrhoea. No bright red blood per rectum, no abdominal pain, change in bowel movement, or constipation.  Genito Urinary  No frequency, urgency, dysuria, no bleeding Musculo Skeletal  No myalgia, arthralgia, joint swelling or pain  Neurologic  No weakness, numbness, change in gait,  Psychology  No depression, anxiety, insomnia.   Vitals:  Blood pressure 111/73, pulse 81, temperature 98.2 F (36.8 C), temperature source Oral, resp. rate 20, height '5\' 8"'  (1.727 m),  weight 155 lb 3.2 oz (70.4 kg), last menstrual period 07/25/2014, SpO2 100 %.  Physical Exam: WD in NAD Neck  Supple NROM, without any enlargements.  Lymph Node Survey No cervical supraclavicular or inguinal adenopathy Cardiovascular  Pulse normal rate, regularity and rhythm. S1 and S2 normal.  Lungs  Clear to auscultation bilateraly, without wheezes/crackles/rhonchi. Good air movement.  Skin  No rash/lesions/breakdown  Psychiatry  Alert and oriented to person, place, and time  Abdomen  Normoactive bowel sounds, abdomen soft, non-tender and thin without evidence of hernia. Incisions soft, no abdominal masses. Back No CVA tenderness Genito Urinary  Surgically absent uterus, cervix. Vaginal cuff smooth, well healed. No lesions, no blood. Rectal  deferred Extremities  No bilateral cyanosis, clubbing or edema.   Thereasa Solo, MD  02/10/2018, 12:42 PM

## 2018-02-10 NOTE — Patient Instructions (Signed)
Please notify Dr Denman George at phone number [phone] if you notice vaginal bleeding, new pelvic or abdominal pains, bloating, feeling full easy, or a change in bladder or bowel function.   Please return to see Dr Sondra Come, as scheduled, in September, and Dr Denman George in December, 2019.

## 2018-02-28 ENCOUNTER — Ambulatory Visit: Payer: BLUE CROSS/BLUE SHIELD | Admitting: Family

## 2018-02-28 ENCOUNTER — Encounter: Payer: Self-pay | Admitting: Family

## 2018-02-28 VITALS — BP 127/83 | HR 98 | Temp 98.2°F | Ht 68.0 in | Wt 155.6 lb

## 2018-02-28 DIAGNOSIS — R1030 Lower abdominal pain, unspecified: Secondary | ICD-10-CM | POA: Diagnosis not present

## 2018-02-28 DIAGNOSIS — Z1211 Encounter for screening for malignant neoplasm of colon: Secondary | ICD-10-CM

## 2018-02-28 DIAGNOSIS — Z720 Tobacco use: Secondary | ICD-10-CM | POA: Diagnosis not present

## 2018-02-28 DIAGNOSIS — R399 Unspecified symptoms and signs involving the genitourinary system: Secondary | ICD-10-CM

## 2018-02-28 DIAGNOSIS — E1100 Type 2 diabetes mellitus with hyperosmolarity without nonketotic hyperglycemic-hyperosmolar coma (NKHHC): Secondary | ICD-10-CM | POA: Diagnosis not present

## 2018-02-28 DIAGNOSIS — F411 Generalized anxiety disorder: Secondary | ICD-10-CM | POA: Diagnosis not present

## 2018-02-28 LAB — URINALYSIS, COMPLETE
Bilirubin, UA: NEGATIVE
KETONES UA: NEGATIVE
LEUKOCYTES UA: NEGATIVE
Nitrite, UA: NEGATIVE
PROTEIN UA: NEGATIVE
RBC UA: NEGATIVE
SPEC GRAV UA: 1.02 (ref 1.005–1.030)
Urobilinogen, Ur: 0.2 mg/dL (ref 0.2–1.0)
pH, UA: 5 (ref 5.0–7.5)

## 2018-02-28 LAB — BAYER DCA HB A1C WAIVED: HB A1C (BAYER DCA - WAIVED): 7.3 % — ABNORMAL HIGH (ref <7.0)

## 2018-02-28 LAB — MICROSCOPIC EXAMINATION
Bacteria, UA: NONE SEEN
Renal Epithel, UA: NONE SEEN /hpf

## 2018-02-28 MED ORDER — NITROFURANTOIN MONOHYD MACRO 100 MG PO CAPS: 100.0000 mg | ORAL_CAPSULE | Freq: Two times a day (BID) | ORAL | 0 refills | Status: DC

## 2018-02-28 MED ORDER — ESCITALOPRAM OXALATE 10 MG PO TABS: 10.0000 mg | ORAL_TABLET | Freq: Every day | ORAL | 1 refills | Status: DC

## 2018-02-28 MED ORDER — METFORMIN HCL ER 500 MG PO TB24: 1000.0000 mg | ORAL_TABLET | Freq: Every day | ORAL | 5 refills | Status: DC

## 2018-02-28 NOTE — Addendum Note (Signed)
Addended by: Evelina Dun A on: 02/28/2018 12:15 PM   Modules accepted: Orders

## 2018-02-28 NOTE — Progress Notes (Signed)
   Subjective:    Patient ID: [name], female    DOB: November 27, 1965, 52 y.o.   MRN: [id number]  Chief Complaint  Patient presents with  . wer abdominal pain  . pain when voiding    Dysuria   This is a new problem. The current episode started yesterday. The problem occurs every urination. The problem has been gradually worsening. The quality of the pain is described as burning. The pain is at a severity of 8/10. The pain is mild. Associated symptoms include flank pain, frequency, hesitancy and urgency. Pertinent negatives include no hematuria, nausea or vomiting. She has tried increased fluids for the symptoms. The treatment provided mild relief.  Diabetes  She presents for her follow-up diabetic visit. She has type 2 diabetes mellitus. Her disease course has been worsening. Diabetic complications include peripheral neuropathy. Pertinent negatives for diabetic complications include no CVA or heart disease. Her overall blood glucose range is 130-140 mg/dl.      Review of Systems  Gastrointestinal: Negative for nausea and vomiting.  Genitourinary: Positive for dysuria, flank pain, frequency, hesitancy and urgency. Negative for hematuria.  All other systems reviewed and are negative.      Objective:   Physical Exam  Constitutional: She is oriented to person, place, and time. She appears well-developed and well-nourished. No distress.  HENT:  Head: Normocephalic.  Eyes: Pupils are equal, round, and reactive to light.  Neck: Normal range of motion. Neck supple. No thyromegaly present.  Cardiovascular: Normal rate, regular rhythm, normal heart sounds and intact distal pulses.  No murmur heard. Pulmonary/Chest: Effort normal and breath sounds normal. No respiratory distress. She has no wheezes.  Abdominal: Soft. Bowel sounds are normal. She exhibits no distension. There is tenderness (mild lower abdominal ).  Musculoskeletal: Normal range of motion. She exhibits no edema or  tenderness.  Neurological: She is alert and oriented to person, place, and time. She has normal reflexes. No cranial nerve deficit.  Skin: Skin is warm and dry.  Psychiatric: She has a normal mood and affect. Her behavior is normal. Judgment and thought content normal.  Vitals reviewed.     BP 127/83   Pulse 98   Temp 98.2 F (36.8 C) (Oral)   Ht _0  (1.727 m)   Wt 155 lb 9.6 oz (70.6 kg)   LMP 07/25/2014 Comment: perimenopausal  BMI 23.66 kg/m      Assessment & Plan:  [name] comes in today with chief complaint of wer abdominal pain and pain when voiding   Diagnosis and orders addressed:  1. Lower abdominal pain - Urinalysis, Complete - Urine Culture - CMP14+EGFR  2. Tobacco user Smoking cessation discussed - CMP14+EGFR  3. UTI symptoms Force fluids AZO over the counter X2 days RTO prn Culture pending - CMP14+EGFR - nitrofurantoin, macrocrystal-monohydrate, (MACROBID) 100 MG capsule; Take 1 capsule (100 mg total) by mouth 2 (two) times daily.  Dispense: 10 capsule; Refill: 0  4. Type 2 diabetes mellitus with hyperosmolarity without coma, without long-term current use of insulin (HCC) - Bayer DCA Hb A1c Waived - CMP14+EGFR - Microalbumin / creatinine urine ratio  5. Colon cancer screening - CMP14+EGFR - Ambulatory referral to Gastroenterology   Labs pending Health Maintenance reviewed Diet and exercise encouraged  Follow up plan: 1 month    Evelina Dun, FNP

## 2018-02-28 NOTE — Patient Instructions (Signed)
Diabetes Mellitus and Nutrition When you have diabetes (diabetes mellitus), it is very important to have healthy eating habits because your blood sugar (glucose) levels are greatly affected by what you eat and drink. Eating healthy foods in the appropriate amounts, at about the same times every day, can help you:  Control your blood glucose.  Lower your risk of heart disease.  Improve your blood pressure.  Reach or maintain a healthy weight.  Every person with diabetes is different, and each person has different needs for a meal plan. Your health care provider may recommend that you work with a diet and nutrition specialist (dietitian) to make a meal plan that is best for you. Your meal plan may vary depending on factors such as:  The calories you need.  The medicines you take.  Your weight.  Your blood glucose, blood pressure, and cholesterol levels.  Your activity level.  Other health conditions you have, such as heart or kidney disease.  How do carbohydrates affect me? Carbohydrates affect your blood glucose level more than any other type of food. Eating carbohydrates naturally increases the amount of glucose in your blood. Carbohydrate counting is a method for keeping track of how many carbohydrates you eat. Counting carbohydrates is important to keep your blood glucose at a healthy level, especially if you use insulin or take certain oral diabetes medicines. It is important to know how many carbohydrates you can safely have in each meal. This is different for every person. Your dietitian can help you calculate how many carbohydrates you should have at each meal and for snack. Foods that contain carbohydrates include:  Bread, cereal, rice, pasta, and crackers.  Potatoes and corn.  Peas, beans, and lentils.  Milk and yogurt.  Fruit and juice.  Desserts, such as cakes, cookies, ice cream, and candy.  How does alcohol affect me? Alcohol can cause a sudden decrease in blood  glucose (hypoglycemia), especially if you use insulin or take certain oral diabetes medicines. Hypoglycemia can be a life-threatening condition. Symptoms of hypoglycemia (sleepiness, dizziness, and confusion) are similar to symptoms of having too much alcohol. If your health care provider says that alcohol is safe for you, follow these guidelines:  Limit alcohol intake to no more than 1 drink per day for nonpregnant women and 2 drinks per day for men. One drink equals 12 oz of beer, 5 oz of wine, or 1 oz of hard liquor.  Do not drink on an empty stomach.  Keep yourself hydrated with water, diet soda, or unsweetened iced tea.  Keep in mind that regular soda, juice, and other mixers may contain a lot of sugar and must be counted as carbohydrates.  What are tips for following this plan? Reading food labels  Start by checking the serving size on the label. The amount of calories, carbohydrates, fats, and other nutrients listed on the label are based on one serving of the food. Many foods contain more than one serving per package.  Check the total grams (g) of carbohydrates in one serving. You can calculate the number of servings of carbohydrates in one serving by dividing the total carbohydrates by 15. For example, if a food has 30 g of total carbohydrates, it would be equal to 2 servings of carbohydrates.  Check the number of grams (g) of saturated and trans fats in one serving. Choose foods that have low or no amount of these fats.  Check the number of milligrams (mg) of sodium in one serving. Most people   should limit total sodium intake to less than 2,300 mg per day.  Always check the nutrition information of foods labeled as "low-fat" or "nonfat". These foods may be higher in added sugar or refined carbohydrates and should be avoided.  Talk to your dietitian to identify your daily goals for nutrients listed on the label. Shopping  Avoid buying canned, premade, or processed foods. These  foods tend to be high in fat, sodium, and added sugar.  Shop around the outside edge of the grocery store. This includes fresh fruits and vegetables, bulk grains, fresh meats, and fresh dairy. Cooking  Use low-heat cooking methods, such as baking, instead of high-heat cooking methods like deep frying.  Cook using healthy oils, such as olive, canola, or sunflower oil.  Avoid cooking with butter, cream, or high-fat meats. Meal planning  Eat meals and snacks regularly, preferably at the same times every day. Avoid going long periods of time without eating.  Eat foods high in fiber, such as fresh fruits, vegetables, beans, and whole grains. Talk to your dietitian about how many servings of carbohydrates you can eat at each meal.  Eat 4-6 ounces of lean protein each day, such as lean meat, chicken, fish, eggs, or tofu. 1 ounce is equal to 1 ounce of meat, chicken, or fish, 1 egg, or 1/4 cup of tofu.  Eat some foods each day that contain healthy fats, such as avocado, nuts, seeds, and fish. Lifestyle   Check your blood glucose regularly.  Exercise at least 30 minutes 5 or more days each week, or as told by your health care provider.  Take medicines as told by your health care provider.  Do not use any products that contain nicotine or tobacco, such as cigarettes and e-cigarettes. If you need help quitting, ask your health care provider.  Work with a counselor or diabetes educator to identify strategies to manage stress and any emotional and social challenges. What are some questions to ask my health care provider?  Do I need to meet with a diabetes educator?  Do I need to meet with a dietitian?  What number can I call if I have questions?  When are the best times to check my blood glucose? Where to find more information:  American Diabetes Association: diabetes.org/food-and-fitness/food  Academy of Nutrition and Dietetics:  www.eatright.org/resources/health/diseases-and-conditions/diabetes  National Institute of Diabetes and Digestive and Kidney Diseases (NIH): www.niddk.nih.gov/health-information/diabetes/overview/diet-eating-physical-activity Summary  A healthy meal plan will help you control your blood glucose and maintain a healthy lifestyle.  Working with a diet and nutrition specialist (dietitian) can help you make a meal plan that is best for you.  Keep in mind that carbohydrates and alcohol have immediate effects on your blood glucose levels. It is important to count carbohydrates and to use alcohol carefully. This information is not intended to replace advice given to you by your health care provider. Make sure you discuss any questions you have with your health care provider. Document Released: 04/29/2005 Document Revised: 09/06/2016 Document Reviewed: 09/06/2016 Elsevier Interactive Patient Education  2018 Elsevier Inc.  

## 2018-03-01 ENCOUNTER — Encounter (INDEPENDENT_AMBULATORY_CARE_PROVIDER_SITE_OTHER): Payer: Self-pay | Admitting: *Deleted

## 2018-03-01 ENCOUNTER — Telehealth: Payer: Self-pay | Admitting: Family

## 2018-03-01 LAB — CMP14+EGFR
ALBUMIN: 4.2 g/dL (ref 3.5–5.5)
ALT: 11 IU/L (ref 0–32)
AST: 15 IU/L (ref 0–40)
Albumin/Globulin Ratio: 1.9 (ref 1.2–2.2)
Alkaline Phosphatase: 114 IU/L (ref 39–117)
BUN / CREAT RATIO: 14 (ref 9–23)
BUN: 11 mg/dL (ref 6–24)
Bilirubin Total: 0.5 mg/dL (ref 0.0–1.2)
CALCIUM: 9.3 mg/dL (ref 8.7–10.2)
CO2: 23 mmol/L (ref 20–29)
CREATININE: 0.8 mg/dL (ref 0.57–1.00)
Chloride: 100 mmol/L (ref 96–106)
GFR calc Af Amer: 98 mL/min/{1.73_m2} (ref >59)
GFR, EST NON AFRICAN AMERICAN: 85 mL/min/{1.73_m2} (ref >59)
Globulin, Total: 2.2 g/dL (ref 1.5–4.5)
Glucose: 198 mg/dL — ABNORMAL HIGH (ref 65–99)
Potassium: 3.9 mmol/L (ref 3.5–5.2)
SODIUM: 139 mmol/L (ref 134–144)
Total Protein: 6.4 g/dL (ref 6.0–8.5)

## 2018-03-01 LAB — MICROALBUMIN / CREATININE URINE RATIO
Creatinine, Urine: 72.9 mg/dL
Microalb/Creat Ratio: <4.1 mg/g creat (ref 0.0–30.0)

## 2018-03-01 LAB — URINE CULTURE

## 2018-03-01 NOTE — Telephone Encounter (Signed)
Left message provider will be back in office tomorrow and we will call once she reviews labs.

## 2018-03-08 DIAGNOSIS — Z008 Encounter for other general examination: Secondary | ICD-10-CM | POA: Diagnosis not present

## 2018-03-08 DIAGNOSIS — E559 Vitamin D deficiency, unspecified: Secondary | ICD-10-CM | POA: Diagnosis not present

## 2018-03-08 DIAGNOSIS — Z716 Tobacco abuse counseling: Secondary | ICD-10-CM | POA: Diagnosis not present

## 2018-03-08 DIAGNOSIS — F17210 Nicotine dependence, cigarettes, uncomplicated: Secondary | ICD-10-CM | POA: Diagnosis not present

## 2018-03-08 DIAGNOSIS — E785 Hyperlipidemia, unspecified: Secondary | ICD-10-CM | POA: Diagnosis not present

## 2018-03-09 ENCOUNTER — Encounter: Payer: Self-pay | Admitting: *Deleted

## 2018-03-17 DIAGNOSIS — J069 Acute upper respiratory infection, unspecified: Secondary | ICD-10-CM | POA: Diagnosis not present

## 2018-03-21 NOTE — Progress Notes (Signed)
In Care Everywhere  

## 2018-04-24 DIAGNOSIS — E559 Vitamin D deficiency, unspecified: Secondary | ICD-10-CM | POA: Diagnosis not present

## 2018-04-24 DIAGNOSIS — Z0130 Encounter for examination of blood pressure without abnormal findings: Secondary | ICD-10-CM | POA: Diagnosis not present

## 2018-04-24 DIAGNOSIS — E785 Hyperlipidemia, unspecified: Secondary | ICD-10-CM | POA: Diagnosis not present

## 2018-04-24 DIAGNOSIS — Z139 Encounter for screening, unspecified: Secondary | ICD-10-CM | POA: Diagnosis not present

## 2018-04-24 DIAGNOSIS — Z79899 Other long term (current) drug therapy: Secondary | ICD-10-CM | POA: Diagnosis not present

## 2018-04-24 DIAGNOSIS — E119 Type 2 diabetes mellitus without complications: Secondary | ICD-10-CM | POA: Diagnosis not present

## 2018-05-03 DIAGNOSIS — E119 Type 2 diabetes mellitus without complications: Secondary | ICD-10-CM | POA: Diagnosis not present

## 2018-05-03 DIAGNOSIS — F419 Anxiety disorder, unspecified: Secondary | ICD-10-CM | POA: Diagnosis not present

## 2018-05-03 DIAGNOSIS — E785 Hyperlipidemia, unspecified: Secondary | ICD-10-CM | POA: Diagnosis not present

## 2018-05-03 DIAGNOSIS — E559 Vitamin D deficiency, unspecified: Secondary | ICD-10-CM | POA: Diagnosis not present

## 2018-05-04 ENCOUNTER — Ambulatory Visit: Payer: Self-pay | Admitting: Radiation Oncology

## 2018-05-09 DIAGNOSIS — Z23 Encounter for immunization: Secondary | ICD-10-CM | POA: Diagnosis not present

## 2018-05-11 ENCOUNTER — Ambulatory Visit: Payer: BLUE CROSS/BLUE SHIELD | Admitting: Radiation Oncology

## 2018-05-17 DIAGNOSIS — E559 Vitamin D deficiency, unspecified: Secondary | ICD-10-CM | POA: Diagnosis not present

## 2018-05-17 DIAGNOSIS — E785 Hyperlipidemia, unspecified: Secondary | ICD-10-CM | POA: Diagnosis not present

## 2018-05-17 DIAGNOSIS — F419 Anxiety disorder, unspecified: Secondary | ICD-10-CM | POA: Diagnosis not present

## 2018-05-17 DIAGNOSIS — Z008 Encounter for other general examination: Secondary | ICD-10-CM | POA: Diagnosis not present

## 2018-05-25 ENCOUNTER — Ambulatory Visit
Admission: RE | Admit: 2018-05-25 | Discharge: 2018-05-25 | Disposition: A | Discharge status: Home / Self Care | Payer: BLUE CROSS/BLUE SHIELD | Source: Ambulatory Visit | Attending: Radiation Oncology | Admitting: Radiation Oncology

## 2018-05-25 ENCOUNTER — Other Ambulatory Visit: Payer: Self-pay

## 2018-05-25 ENCOUNTER — Encounter: Payer: Self-pay | Admitting: Radiation Oncology

## 2018-05-25 VITALS — BP 130/84 | HR 80 | Temp 97.7°F | Resp 16 | Ht 68.0 in | Wt 156.4 lb

## 2018-05-25 DIAGNOSIS — Z8542 Personal history of malignant neoplasm of other parts of uterus: Secondary | ICD-10-CM | POA: Diagnosis not present

## 2018-05-25 DIAGNOSIS — Z7984 Long term (current) use of oral hypoglycemic drugs: Secondary | ICD-10-CM | POA: Diagnosis not present

## 2018-05-25 DIAGNOSIS — Z79899 Other long term (current) drug therapy: Secondary | ICD-10-CM | POA: Insufficient documentation

## 2018-05-25 DIAGNOSIS — C541 Malignant neoplasm of endometrium: Secondary | ICD-10-CM | POA: Diagnosis not present

## 2018-05-25 DIAGNOSIS — Z08 Encounter for follow-up examination after completed treatment for malignant neoplasm: Secondary | ICD-10-CM | POA: Diagnosis not present

## 2018-05-25 NOTE — Progress Notes (Signed)
  Radiation Oncology         [phone] ________________________________  Name: [name] MRN: [id number]  Date: 05/25/2018  DOB: 1966-07-16  Follow-Up Visit Note  CC: [name], FNP  [name], MD    ICD-10-CM   1. Endometrial cancer (HCC) C54.1     Diagnosis: 52 y.o. female with Stage IIIA endometrial cancer (right ovarian involvement with deep myometrial uterine involvement).  Interval Since Last Radiation:  1 year and 3 months  01/27/2017, 01/31/2017, 02/03/2017, 02/17/2017, 02/24/2017: 30 Gy to the vaginal cuff in 5 fractions (HDR)  Narrative:  The patient returns today for routine follow-up. She is accompanied by her husband. She last saw Dr. Denman George on 02/10/18, who noted the patient was disease free on exam.      She reports occasional diarrhea, and denies dysuria or hematuria, vaginal bleeding or discharge, rectal bleeding, constipation, abdominal bloating, issues with appetite, and nausea or vomiting. She notes issues to her left arm. She states it could be her rotator cuff; recommend she consult her primary care physician for further evaluation.    ALLERGIES:  has No Known Allergies.  Meds: Current Outpatient Medications  Medication Sig Dispense Refill  . cyclobenzaprine (FLEXERIL) 10 MG tablet Take 10 mg by mouth as needed for muscle spasms.    Marland Kitchen escitalopram (LEXAPRO) 10 MG tablet Take 1 tablet (10 mg total) by mouth daily. 90 tablet 1  . metFORMIN (GLUCOPHAGE-XR) 500 MG 24 hr tablet Take 2 tablets (1,000 mg total) by mouth daily. 120 tablet 5  . nitrofurantoin, macrocrystal-monohydrate, (MACROBID) 100 MG capsule Take 1 capsule (100 mg total) by mouth 2 (two) times daily. (Patient not taking: Reported on 05/25/2018) 10 capsule 0   No current facility-administered medications for this encounter.     Physical Findings: The patient is in no acute distress. Patient is alert and oriented.  height is 5\' 8"  (1.727 m) and weight is 156 lb 6 oz (70.9  kg). Her oral temperature is 97.7 F (36.5 C). Her blood pressure is 130/84 and her pulse is 80. Her respiration is 16 and oxygen saturation is 100%. .  No significant changes. Lungs are clear to auscultation bilaterally. Heart has regular rate and rhythm. No palpable cervical, supraclavicular, or axillary adenopathy. Abdomen soft, non-tender, normal bowel sounds. On pelvic examination the external genitalia were unremarkable. A speculum exam was performed. There are no mucosal lesions noted in the vaginal vault. On bimanual examination there were no pelvic masses appreciated.   Lab Findings: Lab Results  Component Value Date   WBC 7.8 05/12/2017   HGB 12.1 05/12/2017   HCT 35.0 (L) 05/12/2017   MCV 95.9 05/12/2017   PLT 192 05/12/2017    Radiographic Findings: No results found.  Impression: Stage IIIA endometrial cancer (right ovarian involvement with deep myometrial uterine involvement) No evidence of recurrence on clinical exam.  Plan: She is scheduled to follow up with Dr. Denman George on 08/11/18. She will follow up with radiation oncology in late March 2020.   -----------------------------------  Blair Promise, PhD, MD  This document serves as a record of services personally performed by Gery Pray, MD. It was created on his behalf by Wilburn Mylar, a trained medical scribe. The creation of this record is based on the scribe's personal observations and the provider's statements to them. This document has been checked and approved by the attending provider.

## 2018-05-25 NOTE — Progress Notes (Signed)
Pt presents today for f/u with Dr. Sondra Come. Pt is accompanied by husband. Pt denies dysuria/hematuria. Pt denies vaginal bleeding/discharge. Pt denies rectal bleeding, constipation. Pt reports occasional diarrhea. Pt denies abdominal bloating, N/V.   BP 130/84 (BP Location: Right Arm, Patient Position: Sitting)   Pulse 80   Temp 97.7 F (36.5 C) (Oral)   Resp 16   Ht 5\' 8"  (1.727 m)   Wt 156 lb 6 oz (70.9 kg)   LMP 07/25/2014 Comment: perimenopausal  SpO2 100%   BMI 23.78 kg/m   Wt Readings from Last 3 Encounters:  05/25/18 156 lb 6 oz (70.9 kg)  02/28/18 155 lb 9.6 oz (70.6 kg)  02/10/18 155 lb 3.2 oz (70.4 kg)   Loma Sousa, RN BSN

## 2018-06-14 IMAGING — XA IR FLUORO GUIDE CV LINE*R*
1 series · 1 of 1 positions shown · non-contrast
Comparison: CT the chest, abdomen and pelvis - 11/29/2016

INDICATION: History of endometrial cancer. In need of durable intravenous access
for chemotherapy administration.

EXAM:
IMPLANTED PORT A CATH PLACEMENT WITH ULTRASOUND AND FLUOROSCOPIC
GUIDANCE

[Series 300: line placements · 1 of 1 slices shown]
[im 1/1]
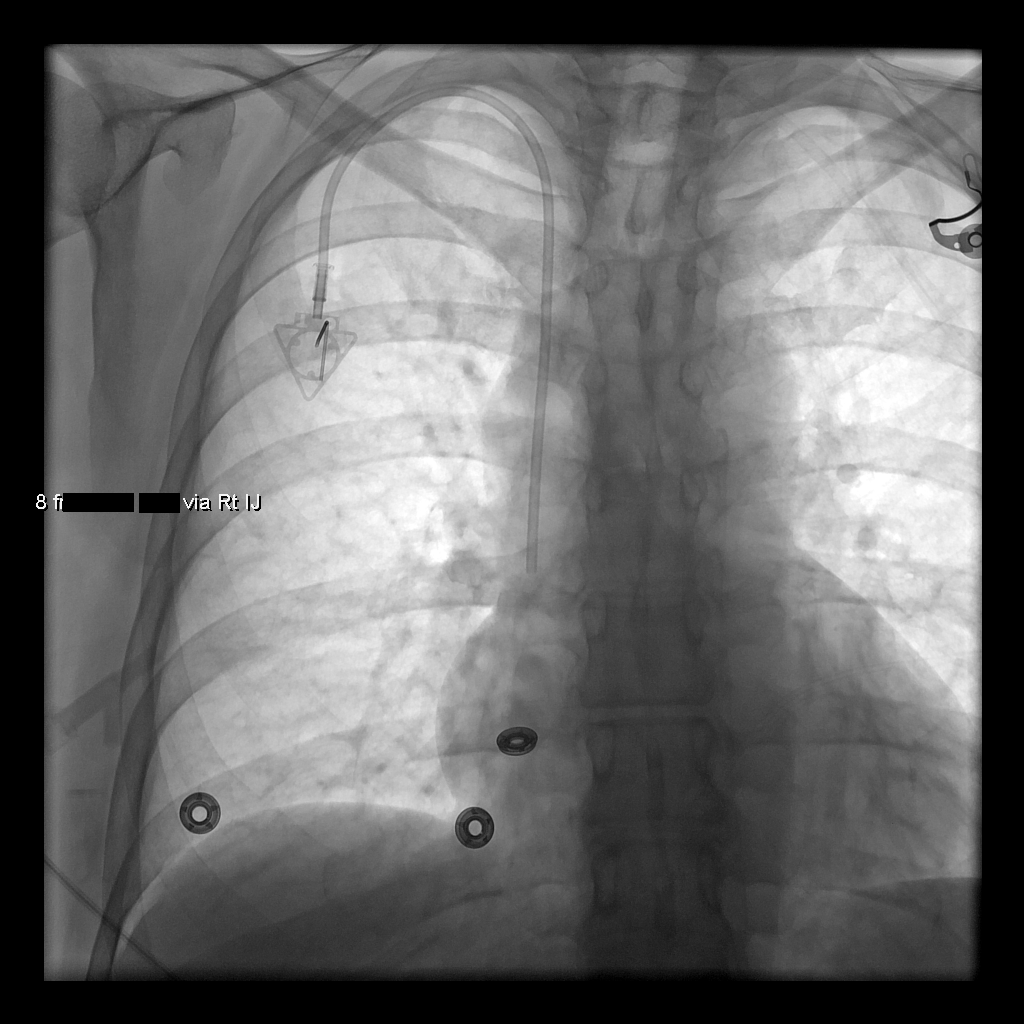

[1 of 1 positions shown; findings below may reference images not displayed]

MEDICATIONS:
Ancef 2 gm IV; The antibiotic was administered within an appropriate
time interval prior to skin puncture.

ANESTHESIA/SEDATION:
Moderate (conscious) sedation was employed during this procedure. A
total of Versed 4 mg and Fentanyl 200 mcg was administered
intravenously.

Moderate Sedation Time: 28 minutes. The patient's level of
consciousness and vital signs were monitored continuously by
radiology nursing throughout the procedure under my direct
supervision.

CONTRAST:  None

FLUOROSCOPY TIME:  24 seconds (3.9 MGy)

COMPLICATIONS:
None immediate.

PROCEDURE:
The procedure, risks, benefits, and alternatives were explained to
the patient. Questions regarding the procedure were encouraged and
answered. The patient understands and consents to the procedure.

The right neck and chest were prepped with chlorhexidine in a
sterile fashion, and a sterile drape was applied covering the
operative field. Maximum barrier sterile technique with sterile
gowns and gloves were used for the procedure. A timeout was
performed prior to the initiation of the procedure. Local anesthesia
was provided with 1% lidocaine with epinephrine.

After creating a small venotomy incision, a micropuncture kit was
utilized to access the internal jugular vein under direct, real-time
ultrasound guidance. Ultrasound image documentation was performed.
The microwire was kinked to measure appropriate catheter length.

A subcutaneous port pocket was then created along the upper chest
wall utilizing a combination of sharp and blunt dissection. The
pocket was irrigated with sterile saline. A single lumen ISP power
injectable port was chosen for placement. The 8 Fr catheter was
tunneled from the port pocket site to the venotomy incision. The
port was placed in the pocket. The external catheter was trimmed to
appropriate length. At the venotomy, an 8 Fr peel-away sheath was
placed over a guidewire under fluoroscopic guidance. The catheter
was then placed through the sheath and the sheath was removed. Final
catheter positioning was confirmed and documented with a
fluoroscopic spot radiograph. The port was accessed with Rnosh Atar
needle, aspirated and flushed with heparinized saline.

The venotomy site was closed with an interrupted 4-0 Vicryl suture.
The port pocket incision was closed with interrupted 2-0 Vicryl
suture and the skin was opposed with a running subcuticular 4-0
Vicryl suture. Dermabond and Dominik were applied to both
incisions. Dressings were placed. The patient tolerated the
procedure well without immediate post procedural complication.
FINDINGS: After catheter placement, the tip lies within the superior
cavoatrial junction. The catheter aspirates and flushes normally and
is ready for immediate use.
IMPRESSION: Successful placement of a right internal jugular approach power
injectable Port-A-Cath. The catheter is ready for immediate use.

## 2018-07-16 DIAGNOSIS — B999 Unspecified infectious disease: Secondary | ICD-10-CM

## 2018-07-16 HISTORY — DX: Unspecified infectious disease: B99.9

## 2018-07-17 DIAGNOSIS — Z716 Tobacco abuse counseling: Secondary | ICD-10-CM | POA: Diagnosis not present

## 2018-07-17 DIAGNOSIS — F17210 Nicotine dependence, cigarettes, uncomplicated: Secondary | ICD-10-CM | POA: Diagnosis not present

## 2018-08-11 ENCOUNTER — Inpatient Hospital Stay: Payer: BLUE CROSS/BLUE SHIELD | Admitting: Gynecologic Oncology

## 2018-08-11 ENCOUNTER — Encounter: Payer: Self-pay | Admitting: Pediatrics

## 2018-08-11 ENCOUNTER — Ambulatory Visit: Payer: BLUE CROSS/BLUE SHIELD | Admitting: Pediatrics

## 2018-08-11 VITALS — BP 118/69 | HR 84 | Temp 97.0°F | Ht 68.0 in | Wt 163.0 lb

## 2018-08-11 DIAGNOSIS — K047 Periapical abscess without sinus: Secondary | ICD-10-CM | POA: Diagnosis not present

## 2018-08-11 MED ORDER — AMOXICILLIN-POT CLAVULANATE 875-125 MG PO TABS: 1.0000 | ORAL_TABLET | Freq: Two times a day (BID) | ORAL | 0 refills | Status: DC

## 2018-08-11 NOTE — Progress Notes (Signed)
  Subjective:   Patient ID: [name], female    DOB: [birth year], 52 y.o.   MRN: [id number] CC: Dental Pain (pt here today c/o tooth pain along front lower lip since Christmas Eve morning)  HPI: [name] is a 52 y.o. female   No fevers that she knows of.  She had radiation for endometrial cancer, was told that she will likely have worsening counts deciding her problem in the future.  She has not recently seen a dentist, try to call their offices were closed earlier this week.  She is noticed worsening pain mostly along the gumline of the front lower teeth, left side worse than right side.  Having some right ear pain as well.  Some runny nose.  Appetite okay but she cannot eat much other than soft foods such as creamed potatoes because of pain.  Relevant past medical, surgical, family and social history reviewed. Allergies and medications reviewed and updated. Social History   Tobacco Use  Smoking Status Current Every Day Smoker  . Packs/day: 0.50  . Years: 34.00  . Pack years: 17.00  . Types: Cigarettes  Smokeless Tobacco Never Used   ROS: Per HPI   Objective:    BP 118/69   Pulse 84   Temp (!) 97 F (36.1 C) (Oral)   Ht 5\' 8"  (1.727 m)   Wt 163 lb (73.9 kg)   LMP 07/25/2014 Comment: perimenopausal  BMI 24.78 kg/m   Wt Readings from Last 3 Encounters:  08/11/18 163 lb (73.9 kg)  05/25/18 156 lb 6 oz (70.9 kg)  02/28/18 155 lb 9.6 oz (70.6 kg)    Gen: NAD, alert, cooperative with exam, NCAT EYES: EOMI, no conjunctival injection, or no icterus ENT:  TMs dull  gray b/l, OP without erythema, front teeth with receding gums, erythematous gumline.  Tender along the gumline LYMPH: no cervical LAD CV: NRRR, normal S1/S2, no murmur, distal pulses 2+ b/l Resp: CTABL, no wheezes, normal WOB Abd: +BS, soft, NTND. Ext: No edema, warm Neuro: Alert and oriented  Assessment & Plan:  Avalee was seen today for dental pain.  Diagnoses and all orders for this  visit:  Dental infection Patient to call dentist to set up appointment soon as possible. -     amoxicillin-clavulanate (AUGMENTIN) 875-125 MG tablet; Take 1 tablet by mouth 2 (two) times daily.   Follow up plan: Return if symptoms worsen or fail to improve. Assunta Found, MD Portal

## 2018-08-23 DIAGNOSIS — F419 Anxiety disorder, unspecified: Secondary | ICD-10-CM | POA: Diagnosis not present

## 2018-08-23 DIAGNOSIS — E119 Type 2 diabetes mellitus without complications: Secondary | ICD-10-CM | POA: Diagnosis not present

## 2018-08-23 DIAGNOSIS — Z716 Tobacco abuse counseling: Secondary | ICD-10-CM | POA: Diagnosis not present

## 2018-08-23 DIAGNOSIS — Z008 Encounter for other general examination: Secondary | ICD-10-CM | POA: Diagnosis not present

## 2018-08-23 DIAGNOSIS — E559 Vitamin D deficiency, unspecified: Secondary | ICD-10-CM | POA: Diagnosis not present

## 2018-08-23 DIAGNOSIS — F17210 Nicotine dependence, cigarettes, uncomplicated: Secondary | ICD-10-CM | POA: Diagnosis not present

## 2018-09-06 ENCOUNTER — Telehealth: Payer: Self-pay | Admitting: *Deleted

## 2018-09-06 DIAGNOSIS — F17210 Nicotine dependence, cigarettes, uncomplicated: Secondary | ICD-10-CM | POA: Diagnosis not present

## 2018-09-06 DIAGNOSIS — E119 Type 2 diabetes mellitus without complications: Secondary | ICD-10-CM | POA: Diagnosis not present

## 2018-09-06 DIAGNOSIS — Z716 Tobacco abuse counseling: Secondary | ICD-10-CM | POA: Diagnosis not present

## 2018-09-06 NOTE — Telephone Encounter (Signed)
Patient called and left a message that she needs to cancel her appt for Friday 1/24. She left a message stating that she will call the office on Monday to reschedule.

## 2018-09-08 ENCOUNTER — Inpatient Hospital Stay: Payer: BLUE CROSS/BLUE SHIELD | Admitting: Gynecologic Oncology

## 2018-09-20 DIAGNOSIS — F17210 Nicotine dependence, cigarettes, uncomplicated: Secondary | ICD-10-CM | POA: Diagnosis not present

## 2018-09-20 DIAGNOSIS — Z716 Tobacco abuse counseling: Secondary | ICD-10-CM | POA: Diagnosis not present

## 2018-09-28 ENCOUNTER — Telehealth: Payer: Self-pay | Admitting: *Deleted

## 2018-09-28 NOTE — Telephone Encounter (Signed)
Returned the patient's call and left a message to call the office back. Patient stated that she needs to schedule an appt with Dr. Denman George

## 2018-09-28 NOTE — Telephone Encounter (Signed)
Patient called the office and was scheduled for an appt next week.

## 2018-10-03 ENCOUNTER — Telehealth: Payer: Self-pay | Admitting: *Deleted

## 2018-10-03 NOTE — Telephone Encounter (Signed)
Attempted to return the patient's call, left a message to call the office back  

## 2018-10-03 NOTE — Telephone Encounter (Signed)
Patient called back and stated "awhile back I had some work done on my teeth and they gave me an antibiotic. I now believe I have a yeast infection, not 100% sure. Can I still come Friday for the appt." Advised the patient that she can still be seen on Friday and to call either her PCP or the dentist for the infection.

## 2018-10-05 ENCOUNTER — Telehealth: Payer: Self-pay | Admitting: *Deleted

## 2018-10-05 NOTE — Telephone Encounter (Signed)
Called and left the patient a message that the cancer center will be open tomorrow even if the snow weather. Asked that she call the office back to confirm appt.

## 2018-10-06 ENCOUNTER — Encounter: Payer: Self-pay | Admitting: Gynecologic Oncology

## 2018-10-06 ENCOUNTER — Inpatient Hospital Stay: Payer: BLUE CROSS/BLUE SHIELD

## 2018-10-06 ENCOUNTER — Inpatient Hospital Stay: Payer: BLUE CROSS/BLUE SHIELD | Attending: Gynecologic Oncology | Admitting: Gynecologic Oncology

## 2018-10-06 VITALS — BP 146/97 | HR 87 | Temp 98.2°F | Resp 20 | Ht 68.0 in | Wt 157.5 lb

## 2018-10-06 DIAGNOSIS — Z08 Encounter for follow-up examination after completed treatment for malignant neoplasm: Secondary | ICD-10-CM | POA: Diagnosis not present

## 2018-10-06 DIAGNOSIS — Z90722 Acquired absence of ovaries, bilateral: Secondary | ICD-10-CM | POA: Insufficient documentation

## 2018-10-06 DIAGNOSIS — R300 Dysuria: Secondary | ICD-10-CM | POA: Insufficient documentation

## 2018-10-06 DIAGNOSIS — Z90710 Acquired absence of both cervix and uterus: Secondary | ICD-10-CM

## 2018-10-06 DIAGNOSIS — Z923 Personal history of irradiation: Secondary | ICD-10-CM | POA: Insufficient documentation

## 2018-10-06 DIAGNOSIS — F17210 Nicotine dependence, cigarettes, uncomplicated: Secondary | ICD-10-CM

## 2018-10-06 DIAGNOSIS — C541 Malignant neoplasm of endometrium: Secondary | ICD-10-CM

## 2018-10-06 DIAGNOSIS — Z8542 Personal history of malignant neoplasm of other parts of uterus: Secondary | ICD-10-CM | POA: Diagnosis not present

## 2018-10-06 DIAGNOSIS — Z9221 Personal history of antineoplastic chemotherapy: Secondary | ICD-10-CM | POA: Insufficient documentation

## 2018-10-06 NOTE — Progress Notes (Signed)
Follow-up Note: Gyn-Onc  Consult was initially requested by Dr. Radene Knee for the evaluation of [name] 53 y.o. female  CC:  Chief Complaint  Patient presents with  . endometrial cancer    Assessment/Plan:  Ms. [name]  is a 53 y.o.  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 Disease free on exam.  I will see [name] back in June, 2020. She will see Dr Sondra Come for follow-up in March, 2020. After June, 2020 we will transition to 6 monthly evaluations.    HPI: [name] is a 53 year old G0 who is seen in consultation at the request of Dr Radene Knee for grade 1 endometrial cancer.  The patient reported post-menopausal bleeding to her physician who performed a TVUS on 10/13/16. This showed a uterus measuring 7.6x4.6x 6.26cm.  The endometrial lining measuringed 2.24cm. The ovaries were normal. A 4cm intracavitary mass was appreciated on [place].  The patient then underwent endometrial pipelle biopsy on 10/13/16. It revealed an endometrioid adenocarcinoma, (FIGO grade 1).   On 11/16/16 she underwent robotic assisted total hysterectomy, BSO, sentinel lymph node biopsy. Final pathology confirmed a stage IIIA grade 1 endometrial cancer with a 5.2cm tumor with 1.2 of 2cm myometrial invasion, no LVSI, and metastases in the right fallopian tube and ovary. The cervix and lymph nodes were benign. The tumor was MSI stable on IHC.  She did well postoperatively with no complaints.  A post-op CT of the chest, abdomen and pelvis showed no gross macroscopic metastatic disease.  She was recommended to receive adjuvant therapy with chemotherapy with 6 cycles of carboplatin paclitaxel with Dr. go such between May 2018 in August 2018.  She also received vaginal cuff brachytherapy, 30 Gy delivered in 5 fractions between the  date 01/27/2017 and 02/24/17.  She tolerated adjuvant therapy well with no complaints.    Interval Hx:  She has no symptoms consistent of recurrence.  Current Meds:  Outpatient Encounter Medications as of 10/06/2018  Medication Sig  . cyclobenzaprine (FLEXERIL) 10 MG tablet Take 10 mg by mouth as needed for muscle spasms.  Marland Kitchen escitalopram (LEXAPRO) 10 MG tablet Take 1 tablet (10 mg total) by mouth daily.  . metFORMIN (GLUCOPHAGE-XR) 500 MG 24 hr tablet Take 2 tablets (1,000 mg total) by mouth daily.  . [DISCONTINUED] amoxicillin-clavulanate (AUGMENTIN) 875-125 MG tablet Take 1 tablet by mouth 2 (two) times daily. (Patient not taking: Reported on 10/06/2018)   No facility-administered encounter medications on file as of 10/06/2018.     Allergy:  Allergies  Allergen Reactions  . Amoxicillin-Pot Clavulanate Other (See Comments)    Vomiting, broke out with bumps in the vaginal area.    Social Hx:   Social History   Socioeconomic History  . Marital status: Married    Spouse name: Aaron Edelman  . Number of children: 0  . Years of education: Not on file  . Highest education level: Not on file  Occupational History  . Occupation: Designer, multimedia  Social Needs  . Financial resource strain: Not on file  . Food insecurity:    Worry: Not on file    Inability: Not on file  . Transportation needs:    Medical: Not on file    Non-medical: Not on file  Tobacco Use  . Smoking status: Current Every Day Smoker    Packs/day: 0.50    Years: 34.00  Pack years: 17.00    Types: Cigarettes  . Smokeless tobacco: Never Used  Substance and Sexual Activity  . Alcohol use: No  . Drug use: No  . Sexual activity: Yes  Lifestyle  . Physical activity:    Days per week: Not on file    Minutes per session: Not on file  . Stress: Not on file  Relationships  . Social connections:    Talks on phone: Not on file    Gets together: Not on file    Attends religious service: Not on file    Active member of  club or organization: Not on file    Attends meetings of clubs or organizations: Not on file    Relationship status: Not on file  . Intimate partner violence:    Fear of current or ex partner: Not on file    Emotionally abused: Not on file    Physically abused: Not on file    Forced sexual activity: Not on file  Other Topics Concern  . Not on file  Social History Narrative  . Not on file    Past Surgical Hx:  Past Surgical History:  Procedure Laterality Date  . BASAL CELL CARCINOMA EXCISION     face  . GANGLION CYST EXCISION Left 08/06/2014   Procedure: EXCISION LEFT  WRIST GANGLION;  Surgeon: Leanora Cover, MD;  Location: [facility];  Service: Orthopedics;  Laterality: Left;  . IR FLUORO GUIDE PORT INSERTION RIGHT  12/15/2016  . IR REMOVAL TUN ACCESS W/ PORT W/O FL MOD SED  05/12/2017  . IR US GUIDE VASC ACCESS RIGHT  12/15/2016  . KNEE SURGERY     right x3  . ROBOTIC ASSISTED TOTAL HYSTERECTOMY WITH BILATERAL SALPINGO OOPHERECTOMY Bilateral 11/16/2016   Procedure: XI ROBOTIC ASSISTED TOTAL HYSTERECTOMY WITH BILATERAL SALPINGO OOPHORECTOMY;  Surgeon: [name], MD;  Location: [facility];  Service: Gynecology;  Laterality: Bilateral;  . SENTINEL NODE BIOPSY N/A 11/16/2016   Procedure: SENTINEL NODE BIOPSY;  Surgeon: [name], MD;  Location: [facility];  Service: Gynecology;  Laterality: N/A;  . TOOTH EXTRACTION      Past Medical Hx:  Past Medical History:  Diagnosis Date  . Anxiety   . Arthritis   . Basal cell carcinoma    face  . Diabetes mellitus without complication ([place])    type II   . Endometrial cancer ([place])   . History of bronchitis   . History of radiation therapy 01/27/17-02/24/17   vaginal brachytherapy to vaginal cuff 30 Gy in 5 fractions  . Infection 07/2018   Gum infection     Past Gynecological History:  G0 Patient's last menstrual period was 07/25/2014.  Family Hx:  Family History  Problem Relation Age of Onset  . Hypertension Mother   . Heart  disease Father   . Cancer Father        lung ca  . Arthritis Sister        rheumatoid  . Cancer Brother        prostate    Review of Systems:  Constitutional  Feels well,    ENT Normal appearing ears and nares bilaterally Skin/Breast  No rash, sores, jaundice, itching, dryness Cardiovascular  No chest pain, shortness of breath, or edema  Pulmonary  No cough or wheeze.  Gastro Intestinal  No nausea, vomitting, or diarrhoea. No bright red blood per rectum, no abdominal pain, change in bowel movement, or constipation.  Genito Urinary  No frequency, urgency, dysuria, no  bleeding Musculo Skeletal  No myalgia, arthralgia, joint swelling or pain  Neurologic  No weakness, numbness, change in gait,  Psychology  No depression, anxiety, insomnia.   Vitals:  Blood pressure (!) 146/97, pulse 87, temperature 98.2 F (36.8 C), temperature source Oral, resp. rate 20, height '5\' 8"'  (1.727 m), weight 157 lb 8 oz (71.4 kg), last menstrual period 07/25/2014, SpO2 100 %.  Physical Exam: WD in NAD Neck  Supple NROM, without any enlargements.  Lymph Node Survey No cervical supraclavicular or inguinal adenopathy Cardiovascular  Pulse normal rate, regularity and rhythm. S1 and S2 normal.  Lungs  Clear to auscultation bilateraly, without wheezes/crackles/rhonchi. Good air movement.  Skin  No rash/lesions/breakdown  Psychiatry  Alert and oriented to person, place, and time  Abdomen  Normoactive bowel sounds, abdomen soft, non-tender and thin without evidence of hernia. Incisions soft, no abdominal masses. Back No CVA tenderness Genito Urinary  Surgically absent uterus, cervix. Vaginal cuff smooth, well healed. No lesions, no blood. Rectal  deferred Extremities  No bilateral cyanosis, clubbing or edema.   Thereasa Solo, MD  10/06/2018, 2:31 PM

## 2018-10-06 NOTE — Addendum Note (Signed)
Addended by: Baruch Merl on: 10/06/2018 02:46 PM   Modules accepted: Orders

## 2018-10-06 NOTE — Patient Instructions (Signed)
Please notify Dr Zyon Rosser at phone number [phone] if you notice vaginal bleeding, new pelvic or abdominal pains, bloating, feeling full easy, or a change in bladder or bowel function.   Please return to see Dr Seaver Machia in June, 2020.  

## 2018-10-07 LAB — URINE CULTURE: Culture: <10000 — AB

## 2018-10-09 ENCOUNTER — Telehealth: Payer: Self-pay

## 2018-10-09 ENCOUNTER — Telehealth: Payer: Self-pay | Admitting: *Deleted

## 2018-10-09 NOTE — Telephone Encounter (Signed)
LM for Ms Manseau to call back to discuss the results of the urine culture.

## 2018-10-09 NOTE — Telephone Encounter (Signed)
Patient states that she is still having some burning and itching while using the bathroom.  I called patient back and left a message on her voicemail that per Joylene John, NP is the burning and itching on the outside does it feel like pins and needles or is the burning sensation on the inside., Per Joylene John patient can use over-the-counter AZO.  I left this information on patient's voicemail per patient request because she is at work.  I told her to give our office a call back. Our office will wait for her to return the call.

## 2018-10-09 NOTE — Telephone Encounter (Signed)
I called patient to inform her of her lab urine specimen results from 02.21.2020.  Per Joylene John, NP their is no evidence of infection.

## 2018-10-19 DIAGNOSIS — M79602 Pain in left arm: Secondary | ICD-10-CM | POA: Diagnosis not present

## 2018-10-19 DIAGNOSIS — M62838 Other muscle spasm: Secondary | ICD-10-CM | POA: Diagnosis not present

## 2018-10-19 DIAGNOSIS — M542 Cervicalgia: Secondary | ICD-10-CM | POA: Diagnosis not present

## 2018-10-19 DIAGNOSIS — Z6823 Body mass index (BMI) 23.0-23.9, adult: Secondary | ICD-10-CM | POA: Diagnosis not present

## 2018-11-06 ENCOUNTER — Telehealth: Payer: Self-pay | Admitting: *Deleted

## 2018-11-06 NOTE — Telephone Encounter (Signed)
CALLED PATIENT TO ASK ABOUT RESCHEDULING FU FOR 11-09-18, LVM FOR A RETURN CALL

## 2018-11-08 ENCOUNTER — Telehealth: Payer: Self-pay | Admitting: *Deleted

## 2018-11-08 NOTE — Telephone Encounter (Signed)
CALLED PATIENT TO ASK QUESTION, LVM FOR A RETURN CALL 

## 2018-11-09 ENCOUNTER — Ambulatory Visit: Payer: BLUE CROSS/BLUE SHIELD | Admitting: Radiation Oncology

## 2018-12-15 ENCOUNTER — Telehealth: Payer: Self-pay | Admitting: *Deleted

## 2018-12-15 NOTE — Telephone Encounter (Signed)
RETURNED PATIENT'S PHONE CALL, LVM FOR A RETURN CALL 

## 2018-12-20 ENCOUNTER — Telehealth: Payer: Self-pay | Admitting: Family

## 2018-12-20 NOTE — Telephone Encounter (Signed)
lmtcb to schedule appt, last visit for this medication was 02/2018

## 2018-12-28 ENCOUNTER — Ambulatory Visit: Payer: BLUE CROSS/BLUE SHIELD | Admitting: Radiation Oncology

## 2019-01-18 ENCOUNTER — Telehealth: Payer: Self-pay | Admitting: *Deleted

## 2019-01-18 ENCOUNTER — Encounter: Payer: Self-pay | Admitting: Radiation Oncology

## 2019-01-18 ENCOUNTER — Ambulatory Visit
Admission: RE | Admit: 2019-01-18 | Discharge: 2019-01-18 | Disposition: A | Discharge status: Home / Self Care | Payer: BC Managed Care – PPO | Source: Ambulatory Visit | Attending: Radiation Oncology | Admitting: Radiation Oncology

## 2019-01-18 ENCOUNTER — Other Ambulatory Visit: Payer: Self-pay

## 2019-01-18 ENCOUNTER — Telehealth: Payer: Self-pay | Admitting: Family

## 2019-01-18 VITALS — BP 134/81 | HR 82 | Temp 97.8°F | Resp 18 | Ht 68.0 in | Wt 160.2 lb

## 2019-01-18 DIAGNOSIS — Z923 Personal history of irradiation: Secondary | ICD-10-CM | POA: Diagnosis not present

## 2019-01-18 DIAGNOSIS — Z7984 Long term (current) use of oral hypoglycemic drugs: Secondary | ICD-10-CM | POA: Diagnosis not present

## 2019-01-18 DIAGNOSIS — Z08 Encounter for follow-up examination after completed treatment for malignant neoplasm: Secondary | ICD-10-CM | POA: Diagnosis not present

## 2019-01-18 DIAGNOSIS — Z8542 Personal history of malignant neoplasm of other parts of uterus: Secondary | ICD-10-CM | POA: Diagnosis not present

## 2019-01-18 DIAGNOSIS — Z79899 Other long term (current) drug therapy: Secondary | ICD-10-CM | POA: Insufficient documentation

## 2019-01-18 DIAGNOSIS — C541 Malignant neoplasm of endometrium: Secondary | ICD-10-CM | POA: Diagnosis not present

## 2019-01-18 NOTE — Progress Notes (Signed)
Follow up 2 years post radiation to the vaginal cuff complains of dryness to genital area and it will start to itch. Denies any bleeding or discharge, No GI, GU issues at present.Last pelvic in February pap smear was done. BP 134/81 (BP Location: Left Arm, Patient Position: Sitting)   Pulse 82   Temp 97.8 F (36.6 C) (Temporal)   Resp 18   Ht 5\' 8"  (1.727 m)   Wt 160 lb 4 oz (72.7 kg)   LMP 07/25/2014 Comment: perimenopausal  SpO2 100%   BMI 24.37 kg/m  Wt Readings from Last 3 Encounters:  01/18/19 160 lb 4 oz (72.7 kg)  10/06/18 157 lb 8 oz (71.4 kg)  08/11/18 163 lb (73.9 kg)

## 2019-01-18 NOTE — Patient Instructions (Signed)
Coronavirus (COVID-19) Are you at risk?  Are you at risk for the Coronavirus (COVID-19)?  To be considered HIGH RISK for Coronavirus (COVID-19), you have to meet the following criteria:  . Traveled to [place], [place], [place], [place] or [place]; or in the [place] to [place], [place], [place], or [place]; and have fever, cough, and shortness of breath within the last 2 weeks of travel OR . Been in close contact with a person diagnosed with COVID-19 within the last 2 weeks and have fever, cough, and shortness of breath . IF YOU DO NOT MEET THESE CRITERIA, YOU ARE CONSIDERED LOW RISK FOR COVID-19.  What to do if you are HIGH RISK for COVID-19?  . If you are having a medical emergency, call 911. . Seek medical care right away. Before you go to a doctor's office, urgent care or emergency department, call ahead and tell them about your recent travel, contact with someone diagnosed with COVID-19, and your symptoms. You should receive instructions from your physician's office regarding next steps of care.  . When you arrive at healthcare provider, tell the healthcare staff immediately you have returned from visiting [place], [place], [place], [place] or [place]; or traveled in the [place] to [place], [place], [place], or [place]; in the last two weeks or you have been in close contact with a person diagnosed with COVID-19 in the last 2 weeks.   . Tell the health care staff about your symptoms: fever, cough and shortness of breath. . After you have been seen by a medical provider, you will be either: o Tested for (COVID-19) and discharged home on quarantine except to seek medical care if symptoms worsen, and asked to  - Stay home and avoid contact with others until you get your results (4-5 days)  - Avoid travel on public transportation if possible (such as bus, train, or airplane) or o Sent to the Emergency Department by EMS for evaluation, COVID-19 testing, and possible  admission depending on your condition and test results.  What to do if you are LOW RISK for COVID-19?  Reduce your risk of any infection by using the same precautions used for avoiding the common cold or flu:  . Wash your hands often with soap and warm water for at least 20 seconds.  If soap and water are not readily available, use an alcohol-based hand sanitizer with at least 60% alcohol.  . If coughing or sneezing, cover your mouth and nose by coughing or sneezing into the elbow areas of your shirt or coat, into a tissue or into your sleeve (not your hands). . Avoid shaking hands with others and consider head nods or verbal greetings only. . Avoid touching your eyes, nose, or mouth with unwashed hands.  . Avoid close contact with people who are sick. . Avoid places or events with large numbers of people in one location, like concerts or sporting events. . Carefully consider travel plans you have or are making. . If you are planning any travel outside or inside the [place], visit the CDC's Travelers' Health webpage for the latest health notices. . If you have some symptoms but not all symptoms, continue to monitor at home and seek medical attention if your symptoms worsen. . If you are having a medical emergency, call 911.   ADDITIONAL HEALTHCARE OPTIONS FOR PATIENTS  [facility] / e-Visit: [email or web address]         [facility]: [phone]  [facility]: [phone]                   [facility]: [phone]   

## 2019-01-18 NOTE — Progress Notes (Signed)
Radiation Oncology         [phone] ________________________________  Name: [name] MRN: [id number]  Date: 01/18/2019  DOB: [birth year]  Follow-Up Visit Note  CC: Tammy Balloon, FNP  Tammy Amber, MD    ICD-10-CM   1. Endometrial cancer ([place]) C54.1     Diagnosis:   53 y.o. female with Stage IIIA endometrial cancer (right ovarian involvement with deep myometrial uterine involvement)  Interval Since Last Radiation:  1 year 11 months   Radiation treatment dates:   01/27/2017, 01/31/2017, 02/03/2017, 02/17/2017, 02/24/2017  Site/dose:   The vaginal cuff was treated to 30 Gy delivered in 5 fractions of 6 Gy.   Narrative:  The patient returns today for routine follow-up.  She was last seen by Dr. Denman [name] 4 months ago with no evidence of disease on clinical exam.  On review of systems, the patient reports dryness in the genital area and with occasional pruritis. She denies any vaginal bleeding or discharge. She denies any GU or GI issues at present.               ALLERGIES:  is allergic to amoxicillin-pot clavulanate.  Meds: Current Outpatient Medications  Medication Sig Dispense Refill  . cyclobenzaprine (FLEXERIL) 10 MG tablet Take 10 mg by mouth as needed for muscle spasms.    Marland Kitchen escitalopram (LEXAPRO) 10 MG tablet Take 1 tablet (10 mg total) by mouth daily. 90 tablet 1  . metFORMIN (GLUCOPHAGE-XR) 500 MG 24 hr tablet Take 2 tablets (1,000 mg total) by mouth daily. 120 tablet 5   No current facility-administered medications for this encounter.     Physical Findings: The patient is in no acute distress. Patient is alert and oriented.  height is 5\' 8"  (1.727 m) and weight is 160 lb 4 oz (72.7 kg). Her temporal temperature is 97.8 F (36.6 C). Her blood pressure is 134/81 and her pulse is 82. Her respiration is 18 and oxygen saturation is 100%.   Lungs are clear to auscultation bilaterally. Heart has regular rate and rhythm. No palpable cervical, supraclavicular,  or axillary adenopathy. Abdomen soft, non-tender, normal bowel sounds.  On pelvic examination the external genitalia show some mild erythema. No lesions noted. A speculum exam was performed. There are no mucosal lesions noted in the vaginal vault. On bimanual examination there were no pelvic masses appreciated.  Lab Findings: Lab Results  Component Value Date   WBC 7.8 05/12/2017   HGB 12.1 05/12/2017   HCT 35.0 (L) 05/12/2017   MCV 95.9 05/12/2017   PLT 192 05/12/2017    Radiographic Findings: No results found.  Impression:  Stage IIIA endometrial cancer (right ovarian involvement with deep myometrial uterine involvement). No evidence of recurrence on exam. The patient does continue to use her vaginal dilator. She reports no bleeding when using it.   Plan:  Patient will follow up in radiation oncology in 1 year. She will see Dr. Denman [name] in December of this year, and we have requested the follow-up with Dr. Denman [name] later this month to be canceled. (The patient delayed rad/onc follow-up due to Covid-19 issues).  ____________________________________  Blair Promise, PhD, MD  This document serves as a record of services personally performed by Gery Pray, MD. It was created on his behalf by Rae Lips, a trained medical scribe. The creation of this record is based on the scribe's personal observations and the provider's statements to them. This document has been checked and approved by the attending provider.

## 2019-01-18 NOTE — Telephone Encounter (Signed)
Per Enid Derry in radiation, Dr. Sondra Come canceled the June appt for Dr. Denman George and moved to December. Called and left Enid Derry a message that the December schedule isn't out yet, but I would keep the message and call the patient once the schedule is out

## 2019-01-24 ENCOUNTER — Telehealth: Payer: Self-pay | Admitting: *Deleted

## 2019-01-24 NOTE — Telephone Encounter (Signed)
Returned the patient's call and left a message "our December schedule is not ready yet, once it is ready I will call you with an appt,"

## 2019-01-31 ENCOUNTER — Ambulatory Visit: Payer: BLUE CROSS/BLUE SHIELD | Admitting: Gynecologic Oncology

## 2019-02-19 ENCOUNTER — Ambulatory Visit: Payer: BLUE CROSS/BLUE SHIELD | Admitting: Family

## 2019-03-06 ENCOUNTER — Encounter: Payer: Self-pay | Admitting: Family

## 2019-03-06 ENCOUNTER — Other Ambulatory Visit: Payer: Self-pay

## 2019-03-06 ENCOUNTER — Ambulatory Visit: Payer: BC Managed Care – PPO | Admitting: Family

## 2019-03-06 VITALS — BP 122/78 | HR 94 | Temp 97.5°F | Ht 68.0 in | Wt 160.6 lb

## 2019-03-06 DIAGNOSIS — F411 Generalized anxiety disorder: Secondary | ICD-10-CM

## 2019-03-06 DIAGNOSIS — I10 Essential (primary) hypertension: Secondary | ICD-10-CM

## 2019-03-06 DIAGNOSIS — G8929 Other chronic pain: Secondary | ICD-10-CM

## 2019-03-06 DIAGNOSIS — M545 Low back pain, unspecified: Secondary | ICD-10-CM

## 2019-03-06 DIAGNOSIS — E1100 Type 2 diabetes mellitus with hyperosmolarity without nonketotic hyperglycemic-hyperosmolar coma (NKHHC): Secondary | ICD-10-CM | POA: Diagnosis not present

## 2019-03-06 DIAGNOSIS — E559 Vitamin D deficiency, unspecified: Secondary | ICD-10-CM

## 2019-03-06 DIAGNOSIS — F19982 Other psychoactive substance use, unspecified with psychoactive substance-induced sleep disorder: Secondary | ICD-10-CM | POA: Diagnosis not present

## 2019-03-06 DIAGNOSIS — Z1211 Encounter for screening for malignant neoplasm of colon: Secondary | ICD-10-CM

## 2019-03-06 DIAGNOSIS — Z720 Tobacco use: Secondary | ICD-10-CM | POA: Diagnosis not present

## 2019-03-06 DIAGNOSIS — Z8542 Personal history of malignant neoplasm of other parts of uterus: Secondary | ICD-10-CM

## 2019-03-06 DIAGNOSIS — Z1212 Encounter for screening for malignant neoplasm of rectum: Secondary | ICD-10-CM

## 2019-03-06 LAB — BAYER DCA HB A1C WAIVED: HB A1C (BAYER DCA - WAIVED): 8.5 % — ABNORMAL HIGH (ref <7.0)

## 2019-03-06 MED ORDER — ONETOUCH ULTRASOFT LANCETS MISC: 12 refills | Status: DC

## 2019-03-06 MED ORDER — ESCITALOPRAM OXALATE 10 MG PO TABS: 10.0000 mg | ORAL_TABLET | Freq: Every day | ORAL | 1 refills | Status: DC

## 2019-03-06 MED ORDER — METFORMIN HCL ER 500 MG PO TB24: 1000.0000 mg | ORAL_TABLET | Freq: Every day | ORAL | 5 refills | Status: DC

## 2019-03-06 MED ORDER — CYCLOBENZAPRINE HCL 10 MG PO TABS: 10.0000 mg | ORAL_TABLET | ORAL | 6 refills | Status: DC | PRN

## 2019-03-06 NOTE — Patient Instructions (Signed)
Diabetes Mellitus and Foot Care Foot care is an important part of your health, especially when you have diabetes. Diabetes may cause you to have problems because of poor blood flow (circulation) to your feet and legs, which can cause your skin to:  Become thinner and drier.  Break more easily.  Heal more slowly.  Peel and crack. You may also have nerve damage (neuropathy) in your legs and feet, causing decreased feeling in them. This means that you may not notice minor injuries to your feet that could lead to more serious problems. Noticing and addressing any potential problems early is the best way to prevent future foot problems. How to care for your feet Foot hygiene  Wash your feet daily with warm water and mild soap. Do not use hot water. Then, pat your feet and the areas between your toes until they are completely dry. Do not soak your feet as this can dry your skin.  Trim your toenails straight across. Do not dig under them or around the cuticle. File the edges of your nails with an emery board or nail file.  Apply a moisturizing lotion or petroleum jelly to the skin on your feet and to dry, brittle toenails. Use lotion that does not contain alcohol and is unscented. Do not apply lotion between your toes. Shoes and socks  Wear clean socks or stockings every day. Make sure they are not too tight. Do not wear knee-high stockings since they may decrease blood flow to your legs.  Wear shoes that fit properly and have enough cushioning. Always look in your shoes before you put them on to be sure there are no objects inside.  To break in new shoes, wear them for just a few hours a day. This prevents injuries on your feet. Wounds, scrapes, corns, and calluses  Check your feet daily for blisters, cuts, bruises, sores, and redness. If you cannot see the bottom of your feet, use a mirror or ask someone for help.  Do not cut corns or calluses or try to remove them with medicine.  If you  find a minor scrape, cut, or break in the skin on your feet, keep it and the skin around it clean and dry. You may clean these areas with mild soap and water. Do not clean the area with peroxide, alcohol, or iodine.  If you have a wound, scrape, corn, or callus on your foot, look at it several times a day to make sure it is healing and not infected. Check for: ? Redness, swelling, or pain. ? Fluid or blood. ? Warmth. ? Pus or a bad smell. General instructions  Do not cross your legs. This may decrease blood flow to your feet.  Do not use heating pads or hot water bottles on your feet. They may burn your skin. If you have lost feeling in your feet or legs, you may not know this is happening until it is too late.  Protect your feet from hot and cold by wearing shoes, such as at the beach or on hot pavement.  Schedule a complete foot exam at least once a year (annually) or more often if you have foot problems. If you have foot problems, report any cuts, sores, or bruises to your health care provider immediately. Contact a health care provider if:  You have a medical condition that increases your risk of infection and you have any cuts, sores, or bruises on your feet.  You have an injury that is not   healing.  You have redness on your legs or feet.  You feel burning or tingling in your legs or feet.  You have pain or cramps in your legs and feet.  Your legs or feet are numb.  Your feet always feel cold.  You have pain around a toenail. Get help right away if:  You have a wound, scrape, corn, or callus on your foot and: ? You have pain, swelling, or redness that gets worse. ? You have fluid or blood coming from the wound, scrape, corn, or callus. ? Your wound, scrape, corn, or callus feels warm to the touch. ? You have pus or a bad smell coming from the wound, scrape, corn, or callus. ? You have a fever. ? You have a red line going up your leg. Summary  Check your feet every day  for cuts, sores, red spots, swelling, and blisters.  Moisturize feet and legs daily.  Wear shoes that fit properly and have enough cushioning.  If you have foot problems, report any cuts, sores, or bruises to your health care provider immediately.  Schedule a complete foot exam at least once a year (annually) or more often if you have foot problems. This information is not intended to replace advice given to you by your health care provider. Make sure you discuss any questions you have with your health care provider. Document Released: 07/30/2000 Document Revised: 09/14/2017 Document Reviewed: 09/03/2016 Elsevier Patient Education  2020 [employer]  

## 2019-03-06 NOTE — Progress Notes (Signed)
Subjective:    Patient ID: [name], female    DOB: [birth year], 53 y.o.   MRN: [id number]  Chief Complaint  Patient presents with  . Medical Management of Chronic Issues  . Diabetes   PT presents to the office today for chronic follow up. She is followed by her Oncologists every 3 months for endometrial cancer. States she has been in remission for 1 1/2 year and doing well.  Diabetes She presents for her follow-up diabetic visit. She has type 2 diabetes mellitus. Her disease course has been stable. Hypoglycemia symptoms include nervousness/anxiousness. Pertinent negatives for hypoglycemia include no confusion. Pertinent negatives for diabetes include no blurred vision, no foot paresthesias and no foot ulcerations. Symptoms are stable. Pertinent negatives for diabetic complications include no CVA, heart disease or peripheral neuropathy. Risk factors for coronary artery disease include post-menopausal. She is following a generally healthy diet. (Does not check BS at home )  Anxiety Presents for follow-up visit. Symptoms include excessive worry, insomnia, nervous/anxious behavior and restlessness. Patient reports no confusion, decreased concentration or depressed mood. Symptoms occur most days. The severity of symptoms is moderate.    Insomnia Primary symptoms: difficulty falling asleep, frequent awakening.  The current episode started more than one year. The onset quality is gradual. The problem occurs intermittently. The problem has been waxing and waning since onset. Past treatments include medication. The treatment provided moderate relief.  Back Pain This is a chronic problem. The current episode started more than 1 year ago. The problem occurs intermittently. The problem has been waxing and waning since onset. The pain is present in the lumbar spine. The quality of the pain is described as aching. The pain is at a severity of 9/10. The pain is moderate. The symptoms are  aggravated by standing. She has tried muscle relaxant for the symptoms. The treatment provided moderate relief.      Review of Systems  Eyes: Negative for blurred vision.  Musculoskeletal: Positive for back pain.  Psychiatric/Behavioral: Negative for confusion and decreased concentration. The patient is nervous/anxious and has insomnia.   All other systems reviewed and are negative.      Objective:   Physical Exam Vitals signs reviewed.  Constitutional:      General: She is not in acute distress.    Appearance: She is well-developed.  HENT:     Head: Normocephalic and atraumatic.     Right Ear: Tympanic membrane normal.     Left Ear: Tympanic membrane normal.  Eyes:     Pupils: Pupils are equal, round, and reactive to light.  Neck:     Musculoskeletal: Normal range of motion and neck supple.     Thyroid: No thyromegaly.  Cardiovascular:     Rate and Rhythm: Normal rate and regular rhythm.     Heart sounds: Normal heart sounds. No murmur.  Pulmonary:     Effort: Pulmonary effort is normal. No respiratory distress.     Breath sounds: Normal breath sounds. No wheezing.  Abdominal:     General: Bowel sounds are normal. There is no distension.     Palpations: Abdomen is soft.     Tenderness: There is no abdominal tenderness.  Musculoskeletal: Normal range of motion.        General: No tenderness.  Skin:    General: Skin is warm and dry.  Neurological:     Mental Status: She is alert and oriented to person, place, and time.     Cranial Nerves: No cranial nerve  deficit.     Deep Tendon Reflexes: Reflexes are normal and symmetric.  Psychiatric:        Behavior: Behavior normal.        Thought Content: Thought content normal.        Judgment: Judgment normal.     Diabetic Foot Exam - Simple   Simple Foot Form Diabetic Foot exam was performed with the following findings: Yes 03/06/2019 11:30 AM  Visual Inspection No deformities, no ulcerations, no other skin breakdown  bilaterally: Yes Sensation Testing Intact to touch and monofilament testing bilaterally: Yes Pulse Check Posterior Tibialis and Dorsalis pulse intact bilaterally: Yes Comments Callus formation below right great toe         BP 122/78   Pulse 94   Temp (!) 97.5 F (36.4 C) (Oral)   Ht 5' 8" (1.727 m)   Wt 160 lb 9.6 oz (72.8 kg)   LMP 07/25/2014 Comment: perimenopausal  BMI 24.42 kg/m   Assessment & Plan:  [name] comes in today with chief complaint of Medical Management of Chronic Issues and Diabetes   Diagnosis and orders addressed:  1. Essential hypertension - CMP14+EGFR - CBC with Differential/Platelet  2. Type 2 diabetes mellitus with hyperosmolarity without coma, without long-term current use of insulin (HCC) - CMP14+EGFR - CBC with Differential/Platelet - Bayer DCA Hb A1c Waived - Microalbumin / creatinine urine ratio - Lancets (ONETOUCH ULTRASOFT) lancets; Use as instructed  Dispense: 100 each; Refill: 12  3. Tobacco user Smoking cessation discussed  - CMP14+EGFR - CBC with Differential/Platelet  4. GAD (generalized anxiety disorder - CMP14+EGFR - CBC with Differential/Platelet - escitalopram (LEXAPRO) 10 MG tablet; Take 1 tablet (10 mg total) by mouth daily.  Dispense: 90 tablet; Refill: 1  5. Insomnia due to drug ([place]) - CMP14+EGFR - CBC with Differential/Platelet  6. Colon cancer screening - CMP14+EGFR - CBC with Differential/Platelet - Cologuard  7. Screening for malignant neoplasm of the rectum - CMP14+EGFR - CBC with Differential/Platelet - Cologuard  8. History of endometrial cancer  9. Vitamin D deficiency  10. Chronic bilateral low back pain without sciatica - cyclobenzaprine (FLEXERIL) 10 MG tablet; Take 1 tablet (10 mg total) by mouth as needed for muscle spasms.  Dispense: 30 tablet; Refill: 6   Labs pending Health Maintenance reviewed Diet and exercise encouraged  Follow up plan: 6 months    Evelina Dun,  FNP

## 2019-03-07 ENCOUNTER — Other Ambulatory Visit: Payer: Self-pay | Admitting: *Deleted

## 2019-03-07 DIAGNOSIS — G8929 Other chronic pain: Secondary | ICD-10-CM

## 2019-03-07 DIAGNOSIS — E1100 Type 2 diabetes mellitus with hyperosmolarity without nonketotic hyperglycemic-hyperosmolar coma (NKHHC): Secondary | ICD-10-CM

## 2019-03-07 LAB — CMP14+EGFR
ALT: 19 IU/L (ref 0–32)
AST: 15 IU/L (ref 0–40)
Albumin/Globulin Ratio: 1.7 (ref 1.2–2.2)
Albumin: 4.1 g/dL (ref 3.8–4.9)
Alkaline Phosphatase: 109 IU/L (ref 39–117)
BUN/Creatinine Ratio: 13 (ref 9–23)
BUN: 11 mg/dL (ref 6–24)
Bilirubin Total: 0.6 mg/dL (ref 0.0–1.2)
CO2: 23 mmol/L (ref 20–29)
Calcium: 9.1 mg/dL (ref 8.7–10.2)
Chloride: 97 mmol/L (ref 96–106)
Creatinine, Ser: 0.87 mg/dL (ref 0.57–1.00)
GFR calc Af Amer: 88 mL/min/{1.73_m2} (ref >59)
GFR calc non Af Amer: 76 mL/min/{1.73_m2} (ref >59)
Globulin, Total: 2.4 g/dL (ref 1.5–4.5)
Glucose: 239 mg/dL — ABNORMAL HIGH (ref 65–99)
Potassium: 3.9 mmol/L (ref 3.5–5.2)
Sodium: 140 mmol/L (ref 134–144)
Total Protein: 6.5 g/dL (ref 6.0–8.5)

## 2019-03-07 LAB — CBC WITH DIFFERENTIAL/PLATELET
Basophils Absolute: 0 10*3/uL (ref 0.0–0.2)
Basos: 0 %
EOS (ABSOLUTE): 0.2 10*3/uL (ref 0.0–0.4)
Eos: 2 %
Hematocrit: 42.6 % (ref 34.0–46.6)
Hemoglobin: 14.4 g/dL (ref 11.1–15.9)
Immature Grans (Abs): 0 10*3/uL (ref 0.0–0.1)
Immature Granulocytes: 0 %
Lymphocytes Absolute: 2.7 10*3/uL (ref 0.7–3.1)
Lymphs: 32 %
MCH: 30.2 pg (ref 26.6–33.0)
MCHC: 33.8 g/dL (ref 31.5–35.7)
MCV: 89 fL (ref 79–97)
Monocytes Absolute: 0.5 10*3/uL (ref 0.1–0.9)
Monocytes: 6 %
Neutrophils Absolute: 5.2 10*3/uL (ref 1.4–7.0)
Neutrophils: 60 %
Platelets: 251 10*3/uL (ref 150–450)
RBC: 4.77 x10E6/uL (ref 3.77–5.28)
RDW: 12 % (ref 11.7–15.4)
WBC: 8.6 10*3/uL (ref 3.4–10.8)

## 2019-03-07 LAB — MICROALBUMIN / CREATININE URINE RATIO
Creatinine, Urine: 103.8 mg/dL
Microalb/Creat Ratio: 5 mg/g creat (ref 0–29)
Microalbumin, Urine: 5.7 ug/mL

## 2019-03-07 MED ORDER — ONETOUCH ULTRASOFT LANCETS MISC: 3 refills | Status: AC

## 2019-03-07 MED ORDER — CYCLOBENZAPRINE HCL 10 MG PO TABS: 10.0000 mg | ORAL_TABLET | ORAL | 1 refills | Status: DC | PRN

## 2019-03-07 NOTE — Telephone Encounter (Signed)
clarification on lancets Sent to mail order, changed quantity on cyclobenzaprine

## 2019-03-08 ENCOUNTER — Other Ambulatory Visit: Payer: Self-pay | Admitting: Family

## 2019-03-08 MED ORDER — METFORMIN HCL ER 500 MG PO TB24: 1000.0000 mg | ORAL_TABLET | Freq: Every day | ORAL | 5 refills | Status: DC

## 2019-03-14 DIAGNOSIS — Z1231 Encounter for screening mammogram for malignant neoplasm of breast: Secondary | ICD-10-CM | POA: Diagnosis not present

## 2019-03-16 ENCOUNTER — Encounter: Payer: Self-pay | Admitting: Family

## 2019-04-10 ENCOUNTER — Ambulatory Visit: Payer: BC Managed Care – PPO | Admitting: Family

## 2019-04-19 ENCOUNTER — Telehealth: Payer: Self-pay | Admitting: *Deleted

## 2019-04-19 NOTE — Telephone Encounter (Signed)
Called and left the patient a message to call the office back.  

## 2019-04-26 ENCOUNTER — Ambulatory Visit: Payer: BC Managed Care – PPO | Admitting: Family

## 2019-05-01 ENCOUNTER — Telehealth: Payer: Self-pay | Admitting: *Deleted

## 2019-05-01 NOTE — Telephone Encounter (Signed)
Patient called and scheduled follow up appt

## 2019-05-16 DIAGNOSIS — E119 Type 2 diabetes mellitus without complications: Secondary | ICD-10-CM | POA: Diagnosis not present

## 2019-05-16 DIAGNOSIS — E785 Hyperlipidemia, unspecified: Secondary | ICD-10-CM | POA: Diagnosis not present

## 2019-05-18 ENCOUNTER — Ambulatory Visit: Payer: BC Managed Care – PPO | Admitting: Family

## 2019-05-28 ENCOUNTER — Other Ambulatory Visit: Payer: Self-pay

## 2019-05-28 ENCOUNTER — Encounter: Payer: Self-pay | Admitting: Family

## 2019-05-28 ENCOUNTER — Ambulatory Visit: Payer: BC Managed Care – PPO | Admitting: Family

## 2019-05-28 VITALS — BP 119/76 | HR 85 | Temp 97.3°F | Ht 68.0 in | Wt 159.2 lb

## 2019-05-28 DIAGNOSIS — E1169 Type 2 diabetes mellitus with other specified complication: Secondary | ICD-10-CM | POA: Diagnosis not present

## 2019-05-28 DIAGNOSIS — I10 Essential (primary) hypertension: Secondary | ICD-10-CM | POA: Diagnosis not present

## 2019-05-28 DIAGNOSIS — E785 Hyperlipidemia, unspecified: Secondary | ICD-10-CM | POA: Diagnosis not present

## 2019-05-28 DIAGNOSIS — F411 Generalized anxiety disorder: Secondary | ICD-10-CM | POA: Diagnosis not present

## 2019-05-28 DIAGNOSIS — E1165 Type 2 diabetes mellitus with hyperglycemia: Secondary | ICD-10-CM | POA: Diagnosis not present

## 2019-05-28 DIAGNOSIS — Z720 Tobacco use: Secondary | ICD-10-CM

## 2019-05-28 LAB — BAYER DCA HB A1C WAIVED: HB A1C (BAYER DCA - WAIVED): 8.2 % — ABNORMAL HIGH (ref <7.0)

## 2019-05-28 NOTE — Progress Notes (Signed)
Subjective:    Patient ID: [name], female    DOB: 03-26-1966, 53 y.o.   MRN: [id number]  Chief Complaint  Patient presents with  . Diabetes   PT presents to the office today to recheck glucose. She was seen on 03/06/19 and found to have a A1C of 8.5. We increased her Metformin to 1000 mg from 500 mg. She states her BS have been around 120-140's.  Diabetes She presents for her follow-up diabetic visit. She has type 2 diabetes mellitus. Her disease course has been stable. Hypoglycemia symptoms include nervousness/anxiousness. Pertinent negatives for diabetes include no blurred vision and no foot paresthesias. Symptoms are stable. Pertinent negatives for diabetic complications include no CVA or heart disease. Risk factors for coronary artery disease include dyslipidemia, diabetes mellitus, hypertension, sedentary lifestyle and post-menopausal. Her weight is stable. Her overall blood glucose range is 110-130 mg/dl. Eye exam is not current.  Hyperlipidemia This is a chronic problem. The current episode started more than 1 year ago. The problem is controlled. Recent lipid tests were reviewed and are normal. Exacerbating diseases include obesity. Current antihyperlipidemic treatment includes statins. The current treatment provides moderate improvement of lipids. Risk factors for coronary artery disease include dyslipidemia, hypertension, a sedentary lifestyle and post-menopausal.  Anxiety Presents for follow-up visit. Symptoms include excessive worry, irritability, nervous/anxious behavior and restlessness. The severity of symptoms is moderate.    Hypertension This is a chronic problem. The current episode started more than 1 year ago. The problem has been resolved since onset. The problem is controlled. Associated symptoms include anxiety. Pertinent negatives include no blurred vision or peripheral edema. Risk factors for coronary artery disease include dyslipidemia and obesity. The  current treatment provides moderate improvement. There is no history of CVA.      Review of Systems  Constitutional: Positive for irritability.  Eyes: Negative for blurred vision.  Psychiatric/Behavioral: The patient is nervous/anxious.   All other systems reviewed and are negative.      Objective:   Physical Exam Vitals signs reviewed.  Constitutional:      General: She is not in acute distress.    Appearance: She is well-developed.  HENT:     Head: Normocephalic and atraumatic.     Right Ear: Tympanic membrane normal.     Left Ear: Tympanic membrane normal.  Eyes:     Pupils: Pupils are equal, round, and reactive to light.  Neck:     Musculoskeletal: Normal range of motion and neck supple.     Thyroid: No thyromegaly.  Cardiovascular:     Rate and Rhythm: Normal rate and regular rhythm.     Heart sounds: Normal heart sounds. No murmur.  Pulmonary:     Effort: Pulmonary effort is normal. No respiratory distress.     Breath sounds: Normal breath sounds. No wheezing.  Abdominal:     General: Bowel sounds are normal. There is no distension.     Palpations: Abdomen is soft.     Tenderness: There is no abdominal tenderness.  Musculoskeletal: Normal range of motion.        General: No tenderness.  Skin:    General: Skin is warm and dry.  Neurological:     Mental Status: She is alert and oriented to person, place, and time.     Cranial Nerves: No cranial nerve deficit.     Deep Tendon Reflexes: Reflexes are normal and symmetric.  Psychiatric:        Behavior: Behavior normal.  Thought Content: Thought content normal.        Judgment: Judgment normal.       BP 119/76   Pulse 85   Temp (!) 97.3 F (36.3 C) (Temporal)   Ht _0  (1.727 m)   Wt 159 lb 3.2 oz (72.2 kg)   LMP 07/25/2014 Comment: perimenopausal  SpO2 97%   BMI 24.21 kg/m      Assessment & Plan:  [name] comes in today with chief complaint of Diabetes   Diagnosis and orders  addressed:  1. Essential hypertension - CMP14+EGFR - CBC with Differential/Platelet  2. Tobacco user Smoking cessation discussed - CMP14+EGFR - CBC with Differential/Platelet  3. GAD (generalized anxiety disorder) - CMP14+EGFR - CBC with Differential/Platelet  4. Type 2 diabetes mellitus with hyperglycemia, without long-term current use of insulin (HCC) - Bayer DCA Hb A1c Waived - CMP14+EGFR - CBC with Differential/Platelet  5. Hyperlipidemia associated with type 2 diabetes mellitus ([place]) - CMP14+EGFR - CBC with Differential/Platelet - Lipid panel   Labs pending Health Maintenance reviewed Diet and exercise encouraged  Follow up plan: 3 months    Evelina Dun, FNP

## 2019-05-28 NOTE — Patient Instructions (Signed)
Leg Cramps Leg cramps occur when one or more muscles tighten and you have no control over this tightening (involuntary muscle contraction). Muscle cramps can develop in any muscle, but the most common place is in the calf muscles of the leg. Those cramps can occur during exercise or when you are at rest. Leg cramps are painful, and they may last for a few seconds to a few minutes. Cramps may return several times before they finally stop. Usually, leg cramps are not caused by a serious medical problem. In many cases, the cause is not known. Some common causes include:  Excessive physical effort (overexertion), such as during intense exercise.  Overuse from repetitive motions, or doing the same thing over and over.  Staying in a certain position for a long period of time.  Improper preparation, form, or technique while performing a sport or an activity.  Dehydration.  Injury.  Side effects of certain medicines.  Abnormally low levels of minerals in your blood (electrolytes), especially potassium and calcium. This could result from: ? Pregnancy. ? Taking diuretic medicines. Follow these instructions at home: Eating and drinking  Drink enough fluid to keep your urine pale yellow. Staying hydrated may help prevent cramps.  Eat a healthy diet that includes plenty of nutrients to help your muscles function. A healthy diet includes fruits and vegetables, lean protein, whole grains, and low-fat or nonfat dairy products. Managing pain, stiffness, and swelling      Try massaging, stretching, and relaxing the affected muscle. Do this for several minutes at a time.  If directed, put ice on areas that are sore or painful after a cramp: ? Put ice in a plastic bag. ? Place a towel between your skin and the bag. ? Leave the ice on for 20 minutes, 2-3 times a day.  If directed, apply heat to muscles that are tense or tight. Do this before you exercise, or as often as told by your health care  provider. Use the heat source that your health care provider recommends, such as a moist heat pack or a heating pad. ? Place a towel between your skin and the heat source. ? Leave the heat on for 20-30 minutes. ? Remove the heat if your skin turns bright red. This is especially important if you are unable to feel pain, heat, or cold. You may have a greater risk of getting burned.  Try taking hot showers or baths to help relax tight muscles. General instructions  If you are having frequent leg cramps, avoid intense exercise for several days.  Take over-the-counter and prescription medicines only as told by your health care provider.  Keep all follow-up visits as told by your health care provider. This is important. Contact a health care provider if:  Your leg cramps get more severe or more frequent, or they do not improve over time.  Your foot becomes cold, numb, or blue. Summary  Muscle cramps can develop in any muscle, but the most common place is in the calf muscles of the leg.  Leg cramps are painful, and they may last for a few seconds to a few minutes.  Usually, leg cramps are not caused by a serious medical problem. Often, the cause is not known.  Stay hydrated and take over-the-counter and prescription medicines only as told by your health care provider. This information is not intended to replace advice given to you by your health care provider. Make sure you discuss any questions you have with your health care  provider. Document Released: 09/09/2004 Document Revised: 07/15/2017 Document Reviewed: 05/12/2017 Elsevier Patient Education  2020 [employer]  

## 2019-05-29 ENCOUNTER — Telehealth: Payer: Self-pay | Admitting: Family

## 2019-05-29 ENCOUNTER — Other Ambulatory Visit: Payer: Self-pay | Admitting: Family

## 2019-05-29 LAB — CMP14+EGFR
ALT: 11 IU/L (ref 0–32)
AST: 8 IU/L (ref 0–40)
Albumin/Globulin Ratio: 1.8 (ref 1.2–2.2)
Albumin: 4.3 g/dL (ref 3.8–4.9)
Alkaline Phosphatase: 123 IU/L — ABNORMAL HIGH (ref 39–117)
BUN/Creatinine Ratio: 11 (ref 9–23)
BUN: 9 mg/dL (ref 6–24)
Bilirubin Total: 0.9 mg/dL (ref 0.0–1.2)
CO2: 24 mmol/L (ref 20–29)
Calcium: 9.3 mg/dL (ref 8.7–10.2)
Chloride: 101 mmol/L (ref 96–106)
Creatinine, Ser: 0.82 mg/dL (ref 0.57–1.00)
GFR calc Af Amer: 94 mL/min/{1.73_m2} (ref >59)
GFR calc non Af Amer: 82 mL/min/{1.73_m2} (ref >59)
Globulin, Total: 2.4 g/dL (ref 1.5–4.5)
Glucose: 192 mg/dL — ABNORMAL HIGH (ref 65–99)
Potassium: 4.1 mmol/L (ref 3.5–5.2)
Sodium: 137 mmol/L (ref 134–144)
Total Protein: 6.7 g/dL (ref 6.0–8.5)

## 2019-05-29 LAB — LIPID PANEL
Chol/HDL Ratio: 4.3 ratio (ref 0.0–4.4)
Cholesterol, Total: 153 mg/dL (ref 100–199)
HDL: 36 mg/dL — ABNORMAL LOW (ref >39)
LDL Chol Calc (NIH): 100 mg/dL — ABNORMAL HIGH (ref 0–99)
Triglycerides: 89 mg/dL (ref 0–149)
VLDL Cholesterol Cal: 17 mg/dL (ref 5–40)

## 2019-05-29 LAB — CBC WITH DIFFERENTIAL/PLATELET
Basophils Absolute: 0 10*3/uL (ref 0.0–0.2)
Basos: 0 %
EOS (ABSOLUTE): 0.1 10*3/uL (ref 0.0–0.4)
Eos: 2 %
Hematocrit: 40.8 % (ref 34.0–46.6)
Hemoglobin: 13.6 g/dL (ref 11.1–15.9)
Immature Grans (Abs): 0 10*3/uL (ref 0.0–0.1)
Immature Granulocytes: 0 %
Lymphocytes Absolute: 2.2 10*3/uL (ref 0.7–3.1)
Lymphs: 33 %
MCH: 29.8 pg (ref 26.6–33.0)
MCHC: 33.3 g/dL (ref 31.5–35.7)
MCV: 90 fL (ref 79–97)
Monocytes Absolute: 0.3 10*3/uL (ref 0.1–0.9)
Monocytes: 4 %
Neutrophils Absolute: 4.1 10*3/uL (ref 1.4–7.0)
Neutrophils: 61 %
Platelets: 239 10*3/uL (ref 150–450)
RBC: 4.56 x10E6/uL (ref 3.77–5.28)
RDW: 12.3 % (ref 11.7–15.4)
WBC: 6.8 10*3/uL (ref 3.4–10.8)

## 2019-05-29 MED ORDER — FARXIGA 5 MG PO TABS: 5.0000 mg | ORAL_TABLET | Freq: Every day | ORAL | 1 refills | Status: DC

## 2019-05-29 MED ORDER — ROSUVASTATIN CALCIUM 10 MG PO TABS: 10.0000 mg | ORAL_TABLET | Freq: Every day | ORAL | 1 refills | Status: DC

## 2019-05-29 NOTE — Telephone Encounter (Signed)
Patient notified. See lab result note

## 2019-05-30 ENCOUNTER — Encounter: Payer: Self-pay | Admitting: Physician Assistant

## 2019-05-30 ENCOUNTER — Ambulatory Visit (INDEPENDENT_AMBULATORY_CARE_PROVIDER_SITE_OTHER): Payer: BC Managed Care – PPO | Admitting: Physician Assistant

## 2019-05-30 DIAGNOSIS — Z23 Encounter for immunization: Secondary | ICD-10-CM | POA: Diagnosis not present

## 2019-05-30 DIAGNOSIS — M79606 Pain in leg, unspecified: Secondary | ICD-10-CM

## 2019-05-30 NOTE — Progress Notes (Signed)
Called 3:35 PM no answer 3:00 PM no answer 3:18 PM no answer 3:21 PM no answer  Terald Sleeper PA-C [facility] [address]  [place],  [address] [phone]

## 2019-06-13 ENCOUNTER — Other Ambulatory Visit: Payer: Self-pay

## 2019-06-13 ENCOUNTER — Ambulatory Visit: Payer: BC Managed Care – PPO | Admitting: Family Medicine

## 2019-06-13 ENCOUNTER — Encounter: Payer: Self-pay | Admitting: Family Medicine

## 2019-06-13 VITALS — BP 116/79 | HR 93 | Temp 97.8°F | Ht 68.0 in | Wt 159.0 lb

## 2019-06-13 DIAGNOSIS — M79652 Pain in left thigh: Secondary | ICD-10-CM

## 2019-06-13 DIAGNOSIS — Z008 Encounter for other general examination: Secondary | ICD-10-CM | POA: Diagnosis not present

## 2019-06-13 DIAGNOSIS — F17210 Nicotine dependence, cigarettes, uncomplicated: Secondary | ICD-10-CM | POA: Diagnosis not present

## 2019-06-13 DIAGNOSIS — Z719 Counseling, unspecified: Secondary | ICD-10-CM | POA: Diagnosis not present

## 2019-06-13 DIAGNOSIS — E559 Vitamin D deficiency, unspecified: Secondary | ICD-10-CM | POA: Diagnosis not present

## 2019-06-13 MED ORDER — DICLOFENAC SODIUM 75 MG PO TBEC: 75.0000 mg | DELAYED_RELEASE_TABLET | Freq: Two times a day (BID) | ORAL | 0 refills | Status: DC | PRN

## 2019-06-13 NOTE — Patient Instructions (Signed)
I have sent in Diclofenac for you to take TWICE daily IF needed for pain.  Take AWAY from your Lexapro. Together they can increase risk for stomach bleeds.  I have referred you to the specialist to take a closer look at your hamstrings.  You have prescribed a nonsteroidal anti-inflammatory drug (NSAID) today. This will help with your pain and inflammation. Please do not take any other NSAIDs (ibuprofen/Motrin/Advil, naproxen/Aleve, meloxicam/Mobic, Voltaren/diclofenac). Please make sure to eat a meal when taking this medication.   Caution:  If you have a history of acid reflux/indigestion, I recommend that you take an antacid (such as Prilosec, Prevacid) daily while on the NSAID.  If you have a history of bleeding disorder, gastric ulcer, are on a blood thinner (like warfarin/Coumadin, Xarelto, Eliquis, etc) please do not take NSAID.  If you have ever had a heart attack, you should not take NSAIDs.   Hamstring Strain  A hamstring strain happens when the muscles in the back of the thighs (hamstring muscles) are overstretched or torn. The hamstring muscles are used in straightening the hips, bending the knees, and pulling back the legs. This injury is often called a pulled hamstring muscle. The tissue that connects the muscle to a bone (tendon) may also be affected. The severity of a hamstring strain may be rated in degrees or grades. First-degree (or grade 1) strains have the least amount of muscle tearing and pain. Second-degree and third-degree (grade 2 and 3) strains have increasingly more tearing and pain. What are the causes? This condition is caused by a sudden, violent force being placed on the hamstring muscles, stretching them too far. This often happens during activities that involve running, jumping, kicking, or weight lifting. What increases the risk? Hamstring strains are especially common in athletes. The following factors may also make you more likely to develop this condition:   Having low strength, endurance, or flexibility of the hamstring muscles.  Doing high-impact physical activity or sports.  Having poor physical fitness.  Having a previous leg injury.  Having tired (fatigued) muscles. What are the signs or symptoms? Symptoms of this condition include:  Pain in the back of the thigh.  Swelling.  Bruising.  Muscle spasms.  Trouble moving the affected muscle because of pain. For severe strains, you may feel popping or snapping in the back of your thigh when the injury occurs. How is this diagnosed? This condition is diagnosed based on your symptoms, your medical history, and a physical exam. How is this treated? Treatment for this condition usually involves:  Protecting, resting, icing, applying compression, and elevating the injured area (PRICE therapy).  Medicines. Your health care provider may recommend medicines to help reduce pain or inflammation.  Doing exercises to regain strength and flexibility in the muscles. Your health care provider will tell you when it is okay to begin exercising. Follow these instructions at home: PRICE therapy Use PRICE therapy to promote muscle healing during the first 2-3 days after your injury, or as told by your health care provider.  Protect the muscle from being injured again.  Rest your injury. This usually involves limiting your normal activities and not using the injured hamstring muscle. Talk with your health care provider about how you should limit your activities.  Apply ice to the injured area: ? Put ice in a plastic bag. ? Place a towel between your skin and the bag. ? Leave the ice on for 20 minutes, 2-3 times a day. After the third day, switch to  applying heat as told.  Put pressure (compression) on your injured hamstring by wrapping it with an elastic bandage. Be careful not to wrap it too tightly. That may interfere with blood circulation or may increase swelling.  Raise (elevate) your  injured hamstring above the level of your heart as often as possible. When you are lying down, you can do this by putting a pillow under your thigh.  Activity  Begin exercising or stretching only as told by your health care provider.  Do not return to full activity level until your health care provider approves.  To help prevent muscle strains in the future, always warm up before exercising and stretch afterward. General instructions  Take over-the-counter and prescription medicines only as told by your health care provider.  If directed, apply heat to the affected area as often as told by your health care provider. Use the heat source that your health care provider recommends, such as a moist heat pack or a heating pad. ? Place a towel between your skin and the heat source. ? Leave the heat on for 20-30 minutes. ? Remove the heat if your skin turns bright red. This is especially important if you are unable to feel pain, heat, or cold. You may have a greater risk of getting burned.  Keep all follow-up visits as told by your health care provider. This is important. Contact a health care provider if you have:  Increasing pain or swelling in the injured area.  Numbness, tingling, or a significant loss of strength in the injured area. Get help right away if:  Your foot or your toes become cold or turn blue. Summary  A hamstring strain happens when the muscles in the back of the thighs (hamstring muscles) are overstretched or torn.  This injury can be caused by a sudden, violent force being placed on the hamstring muscles, causing them to stretch too far.  Symptoms include pain, swelling, and muscle spasms in the injured area.  Treatment includes what is called PRICE therapy: protecting, resting, icing, applying compression, and elevating the injured area. This information is not intended to replace advice given to you by your health care provider. Make sure you discuss any questions  you have with your health care provider. Document Released: 04/27/2001 Document Revised: 07/15/2017 Document Reviewed: 06/30/2017 Elsevier Patient Education  2020 [employer].

## 2019-06-13 NOTE — Progress Notes (Signed)
Subjective: CC: Thigh pain  PCP: [name], FNP [id number] [name] is a 53 y.o. female presenting to clinic today for:  1.  Thigh pain Patient with a 3-week history of left posterior thigh pain.  She notes that she typically gets cramps in the lower extremities but 1 night she was stretching and subsequently had severe thigh pain.  Denies hearing any pop.  She does feel like it is more swollen the other side.  Denies any imbalance, gait instability.  She has chronic tingling of bilateral lower extremities that is unchanged.  She has been applying ice and heat with no improvement in symptoms.  She has not been taking any oral analgesics for treatment.  She does have a history of right knee surgery x3 with Dr. Marlou Sa.   ROS: Per HPI  Allergies  Allergen Reactions  . Amoxicillin-Pot Clavulanate Other (See Comments)    Vomiting, broke out with bumps in the vaginal area.   Past Medical History:  Diagnosis Date  . Anxiety   . Arthritis   . Basal cell carcinoma    face  . Diabetes mellitus without complication ([place])    type II   . Endometrial cancer ([place])   . History of bronchitis   . History of radiation therapy 01/27/17-02/24/17   vaginal brachytherapy to vaginal cuff 30 Gy in 5 fractions  . Infection 07/2018   Gum infection     Current Outpatient Medications:  .  cyclobenzaprine (FLEXERIL) 10 MG tablet, Take 1 tablet (10 mg total) by mouth as needed for muscle spasms., Disp: 90 tablet, Rfl: 1 .  escitalopram (LEXAPRO) 10 MG tablet, Take 1 tablet (10 mg total) by mouth daily., Disp: 90 tablet, Rfl: 1 .  Lancets (ONETOUCH ULTRASOFT) lancets, Check blood sugars daily Dx E11.9, Disp: 100 each, Rfl: 3 .  metFORMIN (GLUCOPHAGE-XR) 500 MG 24 hr tablet, Take 2 tablets (1,000 mg total) by mouth daily. (Patient taking differently: Take 1,000 mg by mouth 2 (two) times daily. ), Disp: 120 tablet, Rfl: 5 .  rosuvastatin (CRESTOR) 10 MG tablet, Take 1 tablet (10 mg total) by mouth  daily., Disp: 90 tablet, Rfl: 1 .  dapagliflozin propanediol (FARXIGA) 5 MG TABS tablet, Take 5 mg by mouth daily before breakfast. (Patient not taking: Reported on 06/13/2019), Disp: 90 tablet, Rfl: 1 Social History   Socioeconomic History  . Marital status: Married    Spouse name: Aaron Edelman  . Number of children: 0  . Years of education: Not on file  . Highest education level: Not on file  Occupational History  . Occupation: Designer, multimedia  Social Needs  . Financial resource strain: Not on file  . Food insecurity    Worry: Not on file    Inability: Not on file  . Transportation needs    Medical: Not on file    Non-medical: Not on file  Tobacco Use  . Smoking status: Current Every Day Smoker    Packs/day: 0.50    Years: 34.00    Pack years: 17.00    Types: Cigarettes  . Smokeless tobacco: Never Used  Substance and Sexual Activity  . Alcohol use: No  . Drug use: No  . Sexual activity: Yes  Lifestyle  . Physical activity    Days per week: Not on file    Minutes per session: Not on file  . Stress: Not on file  Relationships  . Social Herbalist on phone: Not on file    Gets  together: Not on file    Attends religious service: Not on file    Active member of club or organization: Not on file    Attends meetings of clubs or organizations: Not on file    Relationship status: Not on file  . Intimate partner violence    Fear of current or ex partner: Not on file    Emotionally abused: Not on file    Physically abused: Not on file    Forced sexual activity: Not on file  Other Topics Concern  . Not on file  Social History Narrative  . Not on file   Family History  Problem Relation Age of Onset  . Hypertension Mother   . Heart disease Father   . Cancer Father        lung ca  . Arthritis Sister        rheumatoid  . Cancer Brother        prostate    Objective: Office vital signs reviewed. BP 116/79   Pulse 93   Temp 97.8 F (36.6 C) (Temporal)   Ht  5\' 8"  (1.727 m)   Wt 159 lb (72.1 kg)   LMP 07/25/2014 Comment: perimenopausal  BMI 24.18 kg/m   Physical Examination:  General: Awake, alert, well nourished, No acute distress HEENT: Normal, sclera white. Extremities: warm, well perfused, No edema, cyanosis or clubbing; +2 pulses bilaterally MSK: stiff/ antalgic gait and station Skin: dry; intact; no rashes or lesions Neuro: 5/5 Strength and light touch sensation grossly intact, pain reproduced with resisted flexion of the knee.  Assessment/ Plan: 53 y.o. female   1. Acute pain of left thigh Concern for hamstring strain vs tear.  No strength deficits. Will refer to [facility] for eval under ultrasound.  For now oral NSAIDs, Ice, rest. - Ambulatory referral to Sports Medicine - diclofenac (VOLTAREN) 75 MG EC tablet; Take 1 tablet (75 mg total) by mouth 2 (two) times daily as needed for moderate pain.  Dispense: 30 tablet; Refill: 0   No orders of the defined types were placed in this encounter.  No orders of the defined types were placed in this encounter.    Janora Norlander, DO [place] [phone]

## 2019-06-22 ENCOUNTER — Ambulatory Visit: Payer: BC Managed Care – PPO | Admitting: Physician Assistant

## 2019-06-27 ENCOUNTER — Ambulatory Visit: Payer: BC Managed Care – PPO | Admitting: Sports Medicine

## 2019-07-02 DIAGNOSIS — Z719 Counseling, unspecified: Secondary | ICD-10-CM | POA: Diagnosis not present

## 2019-07-02 DIAGNOSIS — F419 Anxiety disorder, unspecified: Secondary | ICD-10-CM | POA: Diagnosis not present

## 2019-07-16 ENCOUNTER — Other Ambulatory Visit: Payer: Self-pay

## 2019-07-16 DIAGNOSIS — Z20822 Contact with and (suspected) exposure to covid-19: Secondary | ICD-10-CM

## 2019-07-18 ENCOUNTER — Telehealth: Payer: Self-pay | Admitting: Family

## 2019-07-18 LAB — NOVEL CORONAVIRUS, NAA: SARS-CoV-2, NAA: NOT DETECTED

## 2019-07-18 NOTE — Telephone Encounter (Signed)
Patient aware COVID results will call back with fax number to her work for [place] to fax results too.

## 2019-07-25 DIAGNOSIS — E119 Type 2 diabetes mellitus without complications: Secondary | ICD-10-CM | POA: Diagnosis not present

## 2019-07-25 DIAGNOSIS — F17210 Nicotine dependence, cigarettes, uncomplicated: Secondary | ICD-10-CM | POA: Diagnosis not present

## 2019-07-25 DIAGNOSIS — Z716 Tobacco abuse counseling: Secondary | ICD-10-CM | POA: Diagnosis not present

## 2019-07-30 ENCOUNTER — Telehealth: Payer: Self-pay | Admitting: *Deleted

## 2019-07-30 NOTE — Telephone Encounter (Signed)
Patient called and rescheduled her appt from 12/17 to 1/25

## 2019-08-02 ENCOUNTER — Inpatient Hospital Stay: Payer: BC Managed Care – PPO | Admitting: Gynecologic Oncology

## 2019-08-20 ENCOUNTER — Telehealth: Payer: Self-pay | Admitting: *Deleted

## 2019-08-20 NOTE — Telephone Encounter (Signed)
Attempted to return the patient's call; left message with new appt date/time per her request

## 2019-09-10 ENCOUNTER — Ambulatory Visit: Payer: BC Managed Care – PPO | Admitting: Gynecologic Oncology

## 2019-09-12 ENCOUNTER — Other Ambulatory Visit: Payer: Self-pay

## 2019-09-12 ENCOUNTER — Encounter: Payer: Self-pay | Admitting: Sports Medicine

## 2019-09-12 ENCOUNTER — Ambulatory Visit: Payer: BC Managed Care – PPO | Admitting: Sports Medicine

## 2019-09-12 VITALS — BP 124/84 | Ht 68.0 in | Wt 150.0 lb

## 2019-09-12 DIAGNOSIS — S76302A Unspecified injury of muscle, fascia and tendon of the posterior muscle group at thigh level, left thigh, initial encounter: Secondary | ICD-10-CM | POA: Diagnosis not present

## 2019-09-12 MED ORDER — MELOXICAM 15 MG PO TABS: ORAL_TABLET | ORAL | 0 refills | Status: DC

## 2019-09-12 NOTE — Patient Instructions (Signed)
You have a left hamstring strain No signs of a tear on the ultrasound today We have prescribed physical therapy for you We have also called in a anti-inflammatory medication to take once a day with food You can purchase a thigh sleeve to wear when walking and at work Recommend icing at the end of a workday Please make a follow-up appointment with me after you have had a few weeks of physical therapy

## 2019-09-12 NOTE — Progress Notes (Addendum)
   [facility] [address] [place], [place] [address] Phone: [phone] Fax: [phone]   Patient Name: [name] Date of Birth: 06-18-1966 Medical Record Number: [id number] Gender: female Date of Encounter: 09/12/2019  SUBJECTIVE:      Chief Complaint:  Left hamstring pain   HPI:  [name] is a 54 year old F presenting with 3 months of intermittent left hamstring pain.  No specific mechanism of injury.  She saw her PCP in October, but the pain gradually went away, but over the last month has increased.  She is now walking with a limp.  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  The pain is in the middle of her posterior upper leg.  No prior injury to this leg.  She denies any change in activity or shoe wear.  She denies swelling, ecchymosis, bruising, erythema or numbness.     ROS:     See HPI.   PERTINENT  PMH / PSH / FH / SH:  Past Medical, Surgical, Social, and Family History Reviewed & Updated in the EMR. Pertinent findings include:  4 right knee surgeries after a trauma, endometrial cancer status post chemo and radiation in remission, tobacco use, vitamin D deficiency   OBJECTIVE:  BP 124/84   Ht 5\' 8"  (1.727 m)   Wt 150 lb (68 kg)   LMP 07/25/2014 Comment: perimenopausal  BMI 22.81 kg/m  Physical Exam:  Vital signs are reviewed.   GEN: Alert and oriented, NAD Pulm: Breathing unlabored PSY: normal mood, congruent affect  MSK: Left hamstring No swelling or ecchymosis Mild TTP at medial hamstring belly No TTP at ischial tuberosity Moderate pain with flexion and extension Increased pain with resisted knee flexion Strength is 3/5 Negative squeeze test Neurovascularly intact distally  Right hamstring No swelling or ecchymosis No TTP No pain with ROM Strength is 4/5 NVI  MSK ultrasound Left hamstring Left hamstring muscle was visualized in long and short axis without any  abnormality, tear, or hypoechoic changes.  There is no signs of a stress fracture to the distal femur.  Impression: Left hamstring pain  ASSESSMENT & PLAN:   1. Left hamstring pain  Given the reassuring ultrasound today, we will treat patient for grade 1 hamstring strain.  We have provided her with formal physical therapy and meloxicam prescription.  Also advised patient to purchase a thigh sleeve that she can wear when ambulating and walking.  She will follow-up in 4 weeks or sooner if symptoms worsen.  Recommended agains downhill walking.   Lanier Clam, DO, ATC Sports Medicine Fellow  Addendum:  Patient seen in the office by fellow.  His history, exam, plan of care were precepted with me.  Karlton Lemon MD [facility]

## 2019-09-17 DIAGNOSIS — M25562 Pain in left knee: Secondary | ICD-10-CM | POA: Diagnosis not present

## 2019-09-17 DIAGNOSIS — M799 Soft tissue disorder, unspecified: Secondary | ICD-10-CM | POA: Diagnosis not present

## 2019-09-17 DIAGNOSIS — M6281 Muscle weakness (generalized): Secondary | ICD-10-CM | POA: Diagnosis not present

## 2019-09-17 DIAGNOSIS — R262 Difficulty in walking, not elsewhere classified: Secondary | ICD-10-CM | POA: Diagnosis not present

## 2019-09-18 ENCOUNTER — Inpatient Hospital Stay: Payer: BC Managed Care – PPO | Attending: Gynecologic Oncology | Admitting: Gynecologic Oncology

## 2019-09-18 ENCOUNTER — Ambulatory Visit: Payer: BC Managed Care – PPO | Admitting: Sports Medicine

## 2019-09-18 ENCOUNTER — Other Ambulatory Visit: Payer: Self-pay

## 2019-09-18 ENCOUNTER — Encounter: Payer: Self-pay | Admitting: Gynecologic Oncology

## 2019-09-18 VITALS — BP 138/71 | HR 85 | Temp 98.5°F | Resp 16 | Ht 68.0 in | Wt 152.6 lb

## 2019-09-18 DIAGNOSIS — Z90710 Acquired absence of both cervix and uterus: Secondary | ICD-10-CM

## 2019-09-18 DIAGNOSIS — Z7984 Long term (current) use of oral hypoglycemic drugs: Secondary | ICD-10-CM | POA: Diagnosis not present

## 2019-09-18 DIAGNOSIS — Z90722 Acquired absence of ovaries, bilateral: Secondary | ICD-10-CM | POA: Insufficient documentation

## 2019-09-18 DIAGNOSIS — Z9221 Personal history of antineoplastic chemotherapy: Secondary | ICD-10-CM | POA: Insufficient documentation

## 2019-09-18 DIAGNOSIS — F17210 Nicotine dependence, cigarettes, uncomplicated: Secondary | ICD-10-CM | POA: Diagnosis not present

## 2019-09-18 DIAGNOSIS — C541 Malignant neoplasm of endometrium: Secondary | ICD-10-CM | POA: Diagnosis not present

## 2019-09-18 DIAGNOSIS — Z79899 Other long term (current) drug therapy: Secondary | ICD-10-CM | POA: Diagnosis not present

## 2019-09-18 DIAGNOSIS — E119 Type 2 diabetes mellitus without complications: Secondary | ICD-10-CM | POA: Insufficient documentation

## 2019-09-18 DIAGNOSIS — F419 Anxiety disorder, unspecified: Secondary | ICD-10-CM | POA: Insufficient documentation

## 2019-09-18 DIAGNOSIS — Z923 Personal history of irradiation: Secondary | ICD-10-CM | POA: Insufficient documentation

## 2019-09-18 NOTE — Progress Notes (Signed)
Follow-up Note: Gyn-Onc  Consult was initially requested by Dr. Radene Knee for the evaluation of [name] 54 y.o. female  CC:  Chief Complaint  Patient presents with  . Endometrial cancer [facility])    Assessment/Plan:  Ms. [name]  is a 54 y.o.  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 Disease free on exam.  I will see [name] back in December, 2021. She will see Dr Sondra Come for follow-up in June, 2021.  HPI: [name] is a 54 year old G0 who is seen in consultation at the request of Dr Radene Knee for grade 1 endometrial cancer.  The patient reported post-menopausal bleeding to her physician who performed a TVUS on 10/13/16. This showed a uterus measuring 7.6x4.6x 6.26cm.  The endometrial lining measuringed 2.24cm. The ovaries were normal. A 4cm intracavitary mass was appreciated on [place].  The patient then underwent endometrial pipelle biopsy on 10/13/16. It revealed an endometrioid adenocarcinoma, (FIGO grade 1).   On 11/16/16 she underwent robotic assisted total hysterectomy, BSO, sentinel lymph node biopsy. Final pathology confirmed a stage IIIA grade 1 endometrial cancer with a 5.2cm tumor with 1.2 of 2cm myometrial invasion, no LVSI, and metastases in the right fallopian tube and ovary. The cervix and lymph nodes were benign. The tumor was MSI stable on IHC.  She did well postoperatively with no complaints.  A post-op CT of the chest, abdomen and pelvis showed no gross macroscopic metastatic disease.  She was recommended to receive adjuvant therapy with chemotherapy with 6 cycles of carboplatin paclitaxel with Dr. go such between May 2018 in August 2018.  She also received vaginal cuff brachytherapy, 30 Gy delivered in 5 fractions between the date 01/27/2017 and 02/24/17.  She tolerated adjuvant  therapy well with no complaints.    Interval Hx:  She has no symptoms consistent of recurrence.  Current Meds:  Outpatient Encounter Medications as of 09/18/2019  Medication Sig  . Cyanocobalamin (B-12 PO) Take 1 tablet by mouth daily.  . cyclobenzaprine (FLEXERIL) 10 MG tablet Take 1 tablet (10 mg total) by mouth as needed for muscle spasms.  Marland Kitchen escitalopram (LEXAPRO) 10 MG tablet Take 1 tablet (10 mg total) by mouth daily.  . Lancets (ONETOUCH ULTRASOFT) lancets Check blood sugars daily Dx E11.9  . meloxicam (MOBIC) 15 MG tablet Take 1 tablet daily with food for 7 days. Then take as needed.  . metFORMIN (GLUCOPHAGE-XR) 500 MG 24 hr tablet Take 2 tablets (1,000 mg total) by mouth daily. (Patient taking differently: Take 1,000 mg by mouth 2 (two) times daily. )  . rosuvastatin (CRESTOR) 10 MG tablet Take 1 tablet (10 mg total) by mouth daily.  . Vitamin D, Ergocalciferol, (DRISDOL) 1.25 MG (50000 UNIT) CAPS capsule Take 50,000 Units by mouth once a week.   No facility-administered encounter medications on file as of 09/18/2019.    Allergy:  Allergies  Allergen Reactions  . Amoxicillin-Pot Clavulanate Other (See Comments)    Vomiting, broke out with bumps in the vaginal area.    Social Hx:   Social History   Socioeconomic History  . Marital status: Married    Spouse name: Aaron Edelman  . Number of children: 0  . Years of education: Not on file  . Highest education level: Not on file  Occupational History  . Occupation: Designer, multimedia  Tobacco Use  . Smoking status: Current Every Day  Smoker    Packs/day: 0.50    Years: 34.00    Pack years: 17.00    Types: Cigarettes  . Smokeless tobacco: Never Used  Substance and Sexual Activity  . Alcohol use: No  . Drug use: No  . Sexual activity: Yes  Other Topics Concern  . Not on file  Social History Narrative  . Not on file   Social Determinants of Health   Financial Resource Strain:   . Difficulty of Paying Living Expenses: Not on  file  Food Insecurity:   . Worried About Charity fundraiser in the Last Year: Not on file  . Ran Out of Food in the Last Year: Not on file  Transportation Needs:   . Lack of Transportation (Medical): Not on file  . Lack of Transportation (Non-Medical): Not on file  Physical Activity:   . Days of Exercise per Week: Not on file  . Minutes of Exercise per Session: Not on file  Stress:   . Feeling of Stress : Not on file  Social Connections:   . Frequency of Communication with Friends and Family: Not on file  . Frequency of Social Gatherings with Friends and Family: Not on file  . Attends Religious Services: Not on file  . Active Member of Clubs or Organizations: Not on file  . Attends Archivist Meetings: Not on file  . Marital Status: Not on file  Intimate Partner Violence:   . Fear of Current or Ex-Partner: Not on file  . Emotionally Abused: Not on file  . Physically Abused: Not on file  . Sexually Abused: Not on file    Past Surgical Hx:  Past Surgical History:  Procedure Laterality Date  . BASAL CELL CARCINOMA EXCISION     face  . GANGLION CYST EXCISION Left 08/06/2014   Procedure: EXCISION LEFT  WRIST GANGLION;  Surgeon: Leanora Cover, MD;  Location: [facility];  Service: Orthopedics;  Laterality: Left;  . IR FLUORO GUIDE PORT INSERTION RIGHT  12/15/2016  . IR REMOVAL TUN ACCESS W/ PORT W/O FL MOD SED  05/12/2017  . IR US GUIDE VASC ACCESS RIGHT  12/15/2016  . KNEE SURGERY     right x3  . ROBOTIC ASSISTED TOTAL HYSTERECTOMY WITH BILATERAL SALPINGO OOPHERECTOMY Bilateral 11/16/2016   Procedure: XI ROBOTIC ASSISTED TOTAL HYSTERECTOMY WITH BILATERAL SALPINGO OOPHORECTOMY;  Surgeon: [name], MD;  Location: [facility];  Service: Gynecology;  Laterality: Bilateral;  . SENTINEL NODE BIOPSY N/A 11/16/2016   Procedure: SENTINEL NODE BIOPSY;  Surgeon: [name], MD;  Location: [facility];  Service: Gynecology;  Laterality: N/A;  . TOOTH EXTRACTION      Past Medical Hx:   Past Medical History:  Diagnosis Date  . Anxiety   . Arthritis   . Basal cell carcinoma    face  . Diabetes mellitus without complication ([place])    type II   . Endometrial cancer ([place])   . History of bronchitis   . History of radiation therapy 01/27/17-02/24/17   vaginal brachytherapy to vaginal cuff 30 Gy in 5 fractions  . Infection 07/2018   Gum infection     Past Gynecological History:  G0 Patient's last menstrual period was 07/25/2014.  Family Hx:  Family History  Problem Relation Age of Onset  . Hypertension Mother   . Heart disease Father   . Cancer Father        lung ca  . Arthritis Sister        rheumatoid  .  Cancer Brother        prostate    Review of Systems:  Constitutional  Feels well,    ENT Normal appearing ears and nares bilaterally Skin/Breast  No rash, sores, jaundice, itching, dryness Cardiovascular  No chest pain, shortness of breath, or edema  Pulmonary  No cough or wheeze.  Gastro Intestinal  No nausea, vomitting, or diarrhoea. No bright red blood per rectum, no abdominal pain, change in bowel movement, or constipation.  Genito Urinary  No frequency, urgency, dysuria, no bleeding Musculo Skeletal  No myalgia, arthralgia, joint swelling or pain  Neurologic  No weakness, numbness, change in gait,  Psychology  No depression, anxiety, insomnia.   Vitals:  Blood pressure 138/71, pulse 85, temperature 98.5 F (36.9 C), temperature source Temporal, resp. rate 16, height '5\' 8"'  (1.727 m), weight 152 lb 9.6 oz (69.2 kg), last menstrual period 07/25/2014, SpO2 97 %.  Physical Exam: WD in NAD Neck  Supple NROM, without any enlargements.  Lymph Node Survey No cervical supraclavicular or inguinal adenopathy Cardiovascular  Pulse normal rate, regularity and rhythm. S1 and S2 normal.  Lungs  Clear to auscultation bilateraly, without wheezes/crackles/rhonchi. Good air movement.  Skin  No rash/lesions/breakdown  Psychiatry  Alert and oriented to  person, place, and time  Abdomen  Normoactive bowel sounds, abdomen soft, non-tender and thin without evidence of hernia. Incisions soft, no abdominal masses. Back No CVA tenderness Genito Urinary  Surgically absent uterus, cervix. Vaginal cuff smooth, well healed. No lesions, no blood. Rectal  deferred Extremities  No bilateral cyanosis, clubbing or edema.   Thereasa Solo, MD  09/18/2019, 3:44 PM

## 2019-09-18 NOTE — Patient Instructions (Signed)
Please notify Dr Denman George at phone number [phone] if you notice vaginal bleeding, new pelvic or abdominal pains, bloating, feeling full easy, or a change in bladder or bowel function.   Please have Dr Clabe Seal office contact Dr Serita Grit office (at [phone]) in June, 2021 to request an appointment with her for December, 2021.

## 2019-10-01 ENCOUNTER — Other Ambulatory Visit: Payer: Self-pay | Admitting: Family

## 2019-10-01 DIAGNOSIS — F411 Generalized anxiety disorder: Secondary | ICD-10-CM

## 2019-10-02 NOTE — Telephone Encounter (Signed)
lmtcb to schedule f/u with PCP

## 2019-10-02 NOTE — Telephone Encounter (Signed)
Pt needs to schedule f/u with PCP for further refills

## 2019-10-29 ENCOUNTER — Encounter: Payer: Self-pay | Admitting: Orthopedic Surgery

## 2019-10-29 ENCOUNTER — Ambulatory Visit: Payer: Self-pay

## 2019-10-29 ENCOUNTER — Other Ambulatory Visit: Payer: Self-pay

## 2019-10-29 ENCOUNTER — Ambulatory Visit: Payer: BC Managed Care – PPO | Admitting: Orthopedic Surgery

## 2019-10-29 DIAGNOSIS — M1712 Unilateral primary osteoarthritis, left knee: Secondary | ICD-10-CM

## 2019-10-29 DIAGNOSIS — M25562 Pain in left knee: Secondary | ICD-10-CM | POA: Diagnosis not present

## 2019-10-29 MED ORDER — METHYLPREDNISOLONE ACETATE 40 MG/ML IJ SUSP
40.0000 mg | INTRAMUSCULAR | Status: AC | PRN
  Administered 2019-10-29: 40 mg via INTRA_ARTICULAR

## 2019-10-29 MED ORDER — LIDOCAINE HCL 1 % IJ SOLN
5.0000 mL | INTRAMUSCULAR | Status: AC | PRN
  Administered 2019-10-29: 5 mL

## 2019-10-29 MED ORDER — BUPIVACAINE HCL 0.25 % IJ SOLN
4.0000 mL | INTRAMUSCULAR | Status: AC | PRN
  Administered 2019-10-29: 4 mL via INTRA_ARTICULAR

## 2019-10-29 NOTE — Progress Notes (Signed)
Office Visit Note   Patient: [name]           Date of Birth: 06-10-1966           MRN: [id number] Visit Date: 10/29/2019 Requested by: [name], [address] [place],  [place] [address] PCP: [name], FNP  Subjective: Chief Complaint  Patient presents with  . Left Knee - Pain  . Left Leg - Pain    HPI: [name] is a patient with left knee and leg pain.  She had pain for several months.  She had an ultrasound which was negative for blood clot.  Patient states that the pain starts slightly above the knee and goes down the back of her leg.  She does 12-hour shifts on her feet.  This started several weeks ago when she forcefully extended her knee.  She feels like there is fluid running down the back of her leg.  She denies any back pain buttock pain or radicular type symptoms.              ROS: All systems reviewed are negative as they relate to the chief complaint within the history of present illness.  Patient denies  fevers or chills.   Assessment & Plan: Visit Diagnoses:  1. Left knee pain, unspecified chronicity     Plan: Impression is left knee pain which likely represents ruptured Baker's cyst.  She has a little bit of calf asymmetry but negative Homans and no blood clot by ultrasound.  Range of motion is full.  I think her best bet may be injection of the left knee to diminish any type of fluid generation within the knee itself which could be transmitted posteriorly.  Continue with normal activities of daily living.  Consider compression socks over-the-counter for a mild amount of compression which could help.  Overall the ruptured Baker's cyst should become less symptomatic in the next 4 to 6 weeks.  Follow-Up Instructions: Return if symptoms worsen or fail to improve.   Orders:  Orders Placed This Encounter  Procedures  . XR KNEE 3 VIEW LEFT   No orders of the defined types were placed in this encounter.     Procedures: Large Joint  Inj: L knee on 10/29/2019 11:55 PM Indications: diagnostic evaluation, joint swelling and pain Details: 18 G 1.5 in needle, superolateral approach  Arthrogram: No  Medications: 5 mL lidocaine 1 %; 40 mg methylPREDNISolone acetate 40 MG/ML; 4 mL bupivacaine 0.25 % Outcome: tolerated well, no immediate complications Procedure, treatment alternatives, risks and benefits explained, specific risks discussed. Consent was given by the patient. Immediately prior to procedure a time out was called to verify the correct patient, procedure, equipment, support staff and site/side marked as required. Patient was prepped and draped in the usual sterile fashion.       Clinical Data: No additional findings.  Objective: Vital Signs: LMP 07/25/2014 Comment: perimenopausal  Physical Exam:   Constitutional: Patient appears well-developed HEENT:  Head: Normocephalic Eyes:EOM are normal Neck: Normal range of motion Cardiovascular: Normal rate Pulmonary/chest: Effort normal Neurologic: Patient is alert Skin: Skin is warm Psychiatric: Patient has normal mood and affect    Ortho Exam: Ortho exam demonstrates normal gait alignment.  Normal Contour with standing on her toes.  She has no effusion in the left knee today.  Collateral crucial ligaments are stable.  Range of motion is full.  No masses lymphadenopathy or skin changes noted in that left knee region.  Extensor  mechanism is intact.  Specialty Comments:  No specialty comments available.  Imaging: XR KNEE 3 VIEW LEFT  Result Date: 10/29/2019 AP lateral merchant left knee reviewed.  No significant arthritis or spurring is noted in the medial lateral patellofemoral compartment of the left knee.  Right knee does have mild to moderate medial and lateral compartment arthritic change.  No acute fracture.  Mild varus alignment bilaterally.    PMFS History: Patient Active Problem List   Diagnosis Date Noted  . History of endometrial cancer  03/06/2019  . HTN (hypertension) 04/25/2017  . Goiter 04/25/2017  . Peripheral neuropathy due to chemotherapy ([place]) 02/25/2017  . Insomnia due to drug ([place]) 01/12/2017  . Menopausal syndrome (hot flashes) 01/12/2017  . Vitamin D deficiency 12/15/2016  . Secondary malignant neoplasm of right ovary ([place]) 11/22/2016  . Endometrial cancer ([place]) 10/28/2016  . Diabetes ([place]) 12/15/2015  . GAD (generalized anxiety disorder) 12/15/2015  . Chronic back pain 12/15/2015  . Tobacco user 09/24/2013   Past Medical History:  Diagnosis Date  . Anxiety   . Arthritis   . Basal cell carcinoma    face  . Diabetes mellitus without complication ([place])    type II   . Endometrial cancer ([place])   . History of bronchitis   . History of radiation therapy 01/27/17-02/24/17   vaginal brachytherapy to vaginal cuff 30 Gy in 5 fractions  . Infection 07/2018   Gum infection     Family History  Problem Relation Age of Onset  . Hypertension Mother   . Heart disease Father   . Cancer Father        lung ca  . Arthritis Sister        rheumatoid  . Cancer Brother        prostate    Past Surgical History:  Procedure Laterality Date  . BASAL CELL CARCINOMA EXCISION     face  . GANGLION CYST EXCISION Left 08/06/2014   Procedure: EXCISION LEFT  WRIST GANGLION;  Surgeon: Leanora Cover, MD;  Location: [facility];  Service: Orthopedics;  Laterality: Left;  . IR FLUORO GUIDE PORT INSERTION RIGHT  12/15/2016  . IR REMOVAL TUN ACCESS W/ PORT W/O FL MOD SED  05/12/2017  . IR US GUIDE VASC ACCESS RIGHT  12/15/2016  . KNEE SURGERY     right x3  . ROBOTIC ASSISTED TOTAL HYSTERECTOMY WITH BILATERAL SALPINGO OOPHERECTOMY Bilateral 11/16/2016   Procedure: XI ROBOTIC ASSISTED TOTAL HYSTERECTOMY WITH BILATERAL SALPINGO OOPHORECTOMY;  Surgeon: [name], MD;  [facility];  Service: Gynecology;  Laterality: Bilateral;  . SENTINEL NODE BIOPSY N/A 11/16/2016   Procedure: SENTINEL NODE BIOPSY;  Surgeon: [name], MD;   [facility];  Service: Gynecology;  Laterality: N/A;  . TOOTH EXTRACTION     Social History   Occupational History  . Occupation: Designer, multimedia  Tobacco Use  . Smoking status: Current Every Day Smoker    Packs/day: 0.50    Years: 34.00    Pack years: 17.00    Types: Cigarettes  . Smokeless tobacco: Never Used  Substance and Sexual Activity  . Alcohol use: No  . Drug use: No  . Sexual activity: Yes

## 2019-12-21 ENCOUNTER — Other Ambulatory Visit: Payer: Self-pay | Admitting: Family

## 2019-12-21 DIAGNOSIS — F411 Generalized anxiety disorder: Secondary | ICD-10-CM

## 2019-12-24 NOTE — Telephone Encounter (Signed)
[facility] NTBS LOV for Dx 05/28/19. Mail order not sent

## 2019-12-24 NOTE — Telephone Encounter (Signed)
Left message for patient to call back to schedule an appointment for medication refills. 

## 2019-12-28 ENCOUNTER — Telehealth: Payer: Self-pay | Admitting: Family

## 2019-12-28 NOTE — Telephone Encounter (Signed)
Please review and advise.

## 2019-12-28 NOTE — Telephone Encounter (Signed)
Yes, I recommend her getting the COVID vaccine.

## 2019-12-31 NOTE — Telephone Encounter (Signed)
Left message, provider advises to get covid vaccine.

## 2020-01-06 ENCOUNTER — Other Ambulatory Visit: Payer: Self-pay | Admitting: Family

## 2020-01-06 DIAGNOSIS — F411 Generalized anxiety disorder: Secondary | ICD-10-CM

## 2020-01-18 ENCOUNTER — Telehealth: Payer: Self-pay

## 2020-01-18 NOTE — Telephone Encounter (Signed)
Patient called to cancel her appointment on 6/7 with Dr. Sondra Come. She states she will call back to reschedule. Romie Jumper advised of the cancellation.

## 2020-01-21 ENCOUNTER — Ambulatory Visit: Payer: BC Managed Care – PPO | Admitting: Radiation Oncology

## 2020-03-13 ENCOUNTER — Ambulatory Visit
Admission: RE | Admit: 2020-03-13 | Discharge: 2020-03-13 | Disposition: A | Discharge status: Home / Self Care | Payer: BC Managed Care – PPO | Source: Ambulatory Visit | Attending: Radiation Oncology | Admitting: Radiation Oncology

## 2020-04-02 DIAGNOSIS — Z1231 Encounter for screening mammogram for malignant neoplasm of breast: Secondary | ICD-10-CM | POA: Diagnosis not present

## 2020-04-09 NOTE — Progress Notes (Signed)
Radiation Oncology         [phone] ________________________________  Name: [name] MRN: [id number]  Date: 04/10/2020  DOB: [birth year]  Follow-Up Visit Note  CC: Tammy Balloon, FNP  Tammy Amber, MD    ICD-10-CM   1. Endometrial cancer (HCC)  C54.1     Diagnosis: Stage IIIA endometrial cancer (right ovarian involvement with deep myometrial uterine involvement)  Interval Since Last Radiation: Three years, one month, and two weeks.  Radiation treatment dates:  01/27/2017, 01/31/2017, 02/03/2017, 02/17/2017, 02/24/2017  Site/dose:  The vaginal cuff was treated to 30 Gy delivered in 5 fractions of 6 Gy.  Narrative:  The patient returns today for routine follow-up. Of note, she was supposed to be seen on 01/21/2020 but cancelled that appointment. Since her last visit, she underwent a routine screen mammogram on 03/14/2019 that did not show any significant abnormality or change.  She was seen by Dr. Denman [name] on 09/18/2019, during which time she was disease free on examination.  Patient missed her June follow-up with me as she was not feeling well  On review of systems, the patient reports feeling well.  She continues to work 12-hour shifts without difficulty. She denies vaginal bleeding pelvic pain or abdominal bloating.  ALLERGIES:  is allergic to amoxicillin-pot clavulanate.  Meds: Current Outpatient Medications  Medication Sig Dispense Refill  . Cyanocobalamin (B-12 PO) Take 1 tablet by mouth daily.    . cyclobenzaprine (FLEXERIL) 10 MG tablet Take 1 tablet (10 mg total) by mouth as needed for muscle spasms. 90 tablet 1  . escitalopram (LEXAPRO) 10 MG tablet TAKE 1 TABLET BY MOUTH  DAILY 90 tablet 0  . metFORMIN (GLUCOPHAGE-XR) 500 MG 24 hr tablet Take 2 tablets (1,000 mg total) by mouth daily. (Patient taking differently: Take 1,000 mg by mouth 2 (two) times daily. ) 120 tablet 5  . Vitamin D, Ergocalciferol, (DRISDOL) 1.25 MG (50000 UNIT) CAPS capsule Take  50,000 Units by mouth once a week.    . Lancets (ONETOUCH ULTRASOFT) lancets Check blood sugars daily Dx E11.9 100 each 3   No current facility-administered medications for this encounter.    Physical Findings: The patient is in no acute distress. Patient is alert and oriented.  height is 5\' 8"  (1.727 m) and weight is 144 lb 2 oz (65.4 kg). Her oral temperature is 98.9 F (37.2 C). Her blood pressure is 139/69 and her pulse is 64. Her respiration is 18 and oxygen saturation is 100%.   Lungs are clear to auscultation bilaterally. Heart has regular rate and rhythm. No palpable cervical, supraclavicular, or axillary adenopathy. Abdomen soft, non-tender, normal bowel sounds.  On pelvic examination the external genitalia were unremarkable. A speculum exam was performed. There are no mucosal lesions noted in the vaginal vault. On bimanual examination there were no pelvic masses appreciated.   Lab Findings: Lab Results  Component Value Date   WBC 6.8 05/28/2019   HGB 13.6 05/28/2019   HCT 40.8 05/28/2019   MCV 90 05/28/2019   PLT 239 05/28/2019    Radiographic Findings: No results found.  Impression:  Stage IIIA endometrial cancer (right ovarian involvement with deep myometrial uterine involvement).   No evidence of recurrence on exam.   Plan:  The patient will follow-up with Dr. Denman [name] in four months and with radiation oncology in June 2022.  Total time spent in this encounter was 25 minutes which included reviewing the patient's most recent mammogram, follow-up with Dr. Denman [name], physical examination, and documentation.  ____________________________________  Tammy Promise, PhD, MD  This document serves as a record of services personally performed by Tammy Pray, MD. It was created on his behalf by [name], a trained medical scribe. The creation of this record is based on the scribe's personal observations and the provider's statements to them. This document has been checked and  approved by the attending provider.

## 2020-04-10 ENCOUNTER — Ambulatory Visit
Admission: RE | Admit: 2020-04-10 | Discharge: 2020-04-10 | Disposition: A | Discharge status: Home / Self Care | Payer: BC Managed Care – PPO | Source: Ambulatory Visit | Attending: Radiation Oncology | Admitting: Radiation Oncology

## 2020-04-10 ENCOUNTER — Encounter: Payer: Self-pay | Admitting: Radiation Oncology

## 2020-04-10 ENCOUNTER — Other Ambulatory Visit: Payer: Self-pay

## 2020-04-10 VITALS — BP 139/69 | HR 64 | Temp 98.9°F | Resp 18 | Ht 68.0 in | Wt 144.1 lb

## 2020-04-10 DIAGNOSIS — Z08 Encounter for follow-up examination after completed treatment for malignant neoplasm: Secondary | ICD-10-CM | POA: Diagnosis not present

## 2020-04-10 DIAGNOSIS — Z79899 Other long term (current) drug therapy: Secondary | ICD-10-CM | POA: Diagnosis not present

## 2020-04-10 DIAGNOSIS — Z7984 Long term (current) use of oral hypoglycemic drugs: Secondary | ICD-10-CM | POA: Insufficient documentation

## 2020-04-10 DIAGNOSIS — Z8542 Personal history of malignant neoplasm of other parts of uterus: Secondary | ICD-10-CM | POA: Diagnosis not present

## 2020-04-10 DIAGNOSIS — C541 Malignant neoplasm of endometrium: Secondary | ICD-10-CM

## 2020-04-10 DIAGNOSIS — Z923 Personal history of irradiation: Secondary | ICD-10-CM | POA: Insufficient documentation

## 2020-04-10 NOTE — Progress Notes (Signed)
Patient here for a f/u visit with Dr. Sondra Come. Patient denies pain, vaginal bleeding or bladder/bowel problems.  BP 139/69 (BP Location: Left Arm, Patient Position: Sitting)   Pulse 64   Temp 98.9 F (37.2 C) (Oral)   Resp 18   Ht 5\' 8"  (1.727 m)   Wt 144 lb 2 oz (65.4 kg)   LMP 07/25/2014 Comment: perimenopausal  SpO2 100%   BMI 21.91 kg/m    Wt Readings from Last 3 Encounters:  04/10/20 144 lb 2 oz (65.4 kg)  09/18/19 152 lb 9.6 oz (69.2 kg)  09/12/19 150 lb (68 kg)

## 2020-04-19 DIAGNOSIS — M25572 Pain in left ankle and joints of left foot: Secondary | ICD-10-CM | POA: Diagnosis not present

## 2020-04-19 DIAGNOSIS — M25461 Effusion, right knee: Secondary | ICD-10-CM | POA: Diagnosis not present

## 2020-04-19 DIAGNOSIS — M79672 Pain in left foot: Secondary | ICD-10-CM | POA: Diagnosis not present

## 2020-04-19 DIAGNOSIS — S82832A Other fracture of upper and lower end of left fibula, initial encounter for closed fracture: Secondary | ICD-10-CM | POA: Diagnosis not present

## 2020-04-19 DIAGNOSIS — Z043 Encounter for examination and observation following other accident: Secondary | ICD-10-CM | POA: Diagnosis not present

## 2020-04-19 DIAGNOSIS — M25561 Pain in right knee: Secondary | ICD-10-CM | POA: Diagnosis not present

## 2020-04-19 DIAGNOSIS — R2242 Localized swelling, mass and lump, left lower limb: Secondary | ICD-10-CM | POA: Diagnosis not present

## 2020-04-22 ENCOUNTER — Telehealth: Payer: Self-pay

## 2020-04-22 NOTE — Telephone Encounter (Signed)
Spoke with patients husband will keep appt with Lurena Joiner on Friday morning.

## 2020-04-22 NOTE — Telephone Encounter (Signed)
FYI-  Patient stated to call her husband, Brian's number if you are not able to reach her on her cell phone due to reception.  Cb# [phone]. Thank you.

## 2020-04-25 ENCOUNTER — Ambulatory Visit: Payer: Self-pay

## 2020-04-25 ENCOUNTER — Ambulatory Visit: Payer: BC Managed Care – PPO | Admitting: Surgical

## 2020-04-25 DIAGNOSIS — M25572 Pain in left ankle and joints of left foot: Secondary | ICD-10-CM | POA: Diagnosis not present

## 2020-04-25 DIAGNOSIS — S93492A Sprain of other ligament of left ankle, initial encounter: Secondary | ICD-10-CM | POA: Diagnosis not present

## 2020-04-25 MED ORDER — CELECOXIB 100 MG PO CAPS: 100.0000 mg | ORAL_CAPSULE | Freq: Two times a day (BID) | ORAL | 0 refills | Status: DC

## 2020-04-25 MED ORDER — TRAMADOL HCL 50 MG PO TABS: 50.0000 mg | ORAL_TABLET | Freq: Two times a day (BID) | ORAL | 0 refills | Status: DC | PRN

## 2020-04-29 ENCOUNTER — Telehealth: Payer: Self-pay | Admitting: Orthopedic Surgery

## 2020-04-29 NOTE — Telephone Encounter (Signed)
[employer] forms received. Sent to [employer]. 

## 2020-05-08 ENCOUNTER — Encounter: Payer: Self-pay | Admitting: Surgical

## 2020-05-08 NOTE — Progress Notes (Addendum)
Office Visit Note   Patient: [name]           Date of Birth: [birth year]           MRN: [id number] Visit Date: 04/25/2020 Requested by: [name], [address] [place] [address] [place],  [place] [address] PCP: [name], FNP  Subjective: Chief Complaint  Patient presents with   Left Ankle - Injury    HPI: Oluwaseun Bruyere is a 54 y.o. female who presents to the office complaining of left ankle pain.  Patient injured her left ankle on 04/18/2020 when she fell down some stairs.  She notes she was able to weight-bear at the time after the injury.  She woke up the next day with significant swelling.  This is the first significant injury to her left ankle with no previous issue.  She has had no significant improvement in pain since the injury.  She localizes the majority of the pain to the anterior lateral ankle but also notes some dorsal pain over the midfoot.  She works as a Glass blower/designer.  She is standing up on a cement floor for 12 hours.  She is currently out of work due to her ankle pain.  She has a history of diabetes and is on Metformin.  She is a smoker and smokes 1 pack/day.  She has been taking Norco with some relief of pain.  She is currently partial weightbearing with crutches in a fracture boot..                ROS: All systems reviewed are negative as they relate to the chief complaint within the history of present illness.  Patient denies fevers or chills.  Assessment & Plan: Visit Diagnoses:  1. Pain in left ankle and joints of left foot   2. Sprain of anterior talofibular ligament of left ankle, initial encounter     Plan: Patient is a 54 year old female who presents complaining of left ankle pain.  She has had left ankle pain since falling downstairs on 9/3.  No history of left ankle trauma.  She does have swelling that is worst over the ATFL and some tenderness over the ATFL and the lateral malleolus.  Radiographs of the left ankle reviewed and are  negative for any fracture or dislocation or any acute changes in general.  Impression is severe ankle sprain with small avulsion fragments distal maleolus  Currently she is partial weightbearing with crutches and a fracture boot.  With negative x-rays and continued swelling and tenderness over the ATFL, recommend she continue to use the walker boot about full-time over the next week.  After 1 week she will come out of the boot periodically to work on peroneal exercises.  She was instructed how to do these exercises.  Prescriptions for Celebrex and tramadol were prescribed to help with swelling and pain.  Remain out of work.  Follow-up in 2 weeks for clinical recheck with Dr. Marlou Sa.  Patient agreed with plan.  Follow-Up Instructions: Return in about 2 weeks (around 05/09/2020).   Orders:  Orders Placed This Encounter  Procedures   XR Ankle Complete Left   Meds ordered this encounter  Medications   celecoxib (CELEBREX) 100 MG capsule    Sig: Take 1 capsule (100 mg total) by mouth 2 (two) times daily.    Dispense:  60 capsule    Refill:  0   traMADol (ULTRAM) 50 MG tablet    Sig: Take 1 tablet (50  mg total) by mouth every 12 (twelve) hours as needed.    Dispense:  30 tablet    Refill:  0      Procedures: No procedures performed   Clinical Data: No additional findings.  Objective: Vital Signs: LMP 07/25/2014 Comment: perimenopausal  Physical Exam:  Constitutional: Patient appears well-developed HEENT:  Head: Normocephalic Eyes:EOM are normal Neck: Normal range of motion Cardiovascular: Normal rate Pulmonary/chest: Effort normal Neurologic: Patient is alert Skin: Skin is warm Psychiatric: Patient has normal mood and affect  Ortho Exam: Ortho exam demonstrates left ankle with swelling over the lateral aspect.  She does have some tenderness over the lateral malleolus but most tenderness overlies the ATFL.  Mild tenderness over the CFL ligament.  No significant tenderness over the  deltoid ligament, Achilles tendon, Achilles tendon insertion, retrocalcaneal space, medial malleolus, fifth metatarsal base, posterior tibialis tendon insertion.  No significant tenderness over the Lisfranc complex.  No pain with stressing of the Lisfranc complex.  No plantar ecchymosis noted.  Active dorsiflexion and plantarflexion is intact.  Active inversion/eversion intact.  1+ DP pulse present.  Left ankle syndesmosis intact.  No significant tenderness overlying the proximal fibula.  Specialty Comments:  No specialty comments available.  Imaging: No results found.   PMFS History: Patient Active Problem List   Diagnosis Date Noted   History of endometrial cancer 03/06/2019   HTN (hypertension) 04/25/2017   Goiter 04/25/2017   Peripheral neuropathy due to chemotherapy ([facility]) 02/25/2017   Insomnia due to drug ([facility]) 01/12/2017   Menopausal syndrome (hot flashes) 01/12/2017   Vitamin D deficiency 12/15/2016   Secondary malignant neoplasm of right ovary ([facility]) 11/22/2016   Endometrial cancer ([facility]) 10/28/2016   Diabetes ([facility]) 12/15/2015   GAD (generalized anxiety disorder) 12/15/2015   Chronic back pain 12/15/2015   Tobacco user 09/24/2013   Past Medical History:  Diagnosis Date   Anxiety    Arthritis    Basal cell carcinoma    face   Diabetes mellitus without complication ([facility])    type II    Endometrial cancer ([facility])    History of bronchitis    History of radiation therapy 01/27/17-02/24/17   vaginal brachytherapy to vaginal cuff 30 Gy in 5 fractions   Infection 07/2018   Gum infection     Family History  Problem Relation Age of Onset   Hypertension Mother    Heart disease Father    Cancer Father        lung ca   Arthritis Sister        rheumatoid   Cancer Brother        prostate    Past Surgical History:  Procedure Laterality Date   BASAL CELL CARCINOMA EXCISION     face   GANGLION CYST EXCISION Left 08/06/2014   Procedure: EXCISION LEFT  WRIST GANGLION;  Surgeon:  Leanora Cover, MD;  Location: [facility];  Service: Orthopedics;  Laterality: Left;   IR FLUORO GUIDE PORT INSERTION RIGHT  12/15/2016   IR REMOVAL TUN ACCESS W/ PORT W/O FL MOD SED  05/12/2017   IR US GUIDE VASC ACCESS RIGHT  12/15/2016   KNEE SURGERY     right x3   ROBOTIC ASSISTED TOTAL HYSTERECTOMY WITH BILATERAL SALPINGO OOPHERECTOMY Bilateral 11/16/2016   Procedure: XI ROBOTIC ASSISTED TOTAL HYSTERECTOMY WITH BILATERAL SALPINGO OOPHORECTOMY;  Surgeon: [name], MD;  [facility];  Service: Gynecology;  Laterality: Bilateral;   SENTINEL NODE BIOPSY N/A 11/16/2016   Procedure: SENTINEL NODE BIOPSY;  Surgeon: [name], MD;  [facility];  Service: Gynecology;  Laterality: N/A;   TOOTH EXTRACTION     Social History   Occupational History   Occupation: Designer, multimedia  Tobacco Use   Smoking status: Current Every Day Smoker    Packs/day: 0.50    Years: 34.00    Pack years: 17.00    Types: Cigarettes   Smokeless tobacco: Never Used  Scientific laboratory technician Use: Never used  Substance and Sexual Activity   Alcohol use: No   Drug use: No   Sexual activity: Yes

## 2020-05-09 ENCOUNTER — Ambulatory Visit (INDEPENDENT_AMBULATORY_CARE_PROVIDER_SITE_OTHER): Payer: BC Managed Care – PPO | Admitting: Orthopedic Surgery

## 2020-05-09 ENCOUNTER — Encounter: Payer: Self-pay | Admitting: Orthopedic Surgery

## 2020-05-09 VITALS — Ht 68.0 in | Wt 144.0 lb

## 2020-05-09 DIAGNOSIS — S93492A Sprain of other ligament of left ankle, initial encounter: Secondary | ICD-10-CM

## 2020-05-09 NOTE — Progress Notes (Signed)
Office Visit Note   Patient: [name]           Date of Birth: 1965-08-30           MRN: [id number] Visit Date: 05/09/2020 Requested by: [name], [address] [place],  [place] [address] PCP: [name], FNP  Subjective: Chief Complaint  Patient presents with  . Left Ankle - Follow-up    Fall 04/18/2020    HPI: Patient presents now 2 weeks out from left significant lateral ankle sprain with fibular tip avulsion fracture fragments noted on radiographs.  She has been in a fracture boot partial weightbearing with crutches.  She works 12-hour shifts at [employer].              ROS: All systems reviewed are negative as they relate to the chief complaint within the history of present illness.  Patient denies  fevers or chills.   Assessment & Plan: Visit Diagnoses:  1. Sprain of anterior talofibular ligament of left ankle, initial encounter     Plan: Impression is significant left ankle sprain.  Plan is fracture boot immobilization weightbearing as tolerated for 1 more week then change over the ASO next Friday.  Start physical therapy for ankle range of motion exercises with the goal of returning to work in 3 to 4 weeks.  Out of work for the next 3 weeks.  Come back at that time for clinical recheck and repeat assessment about suitability for return to work.  Negative Homans no calf tenderness today.  Follow-Up Instructions: Return in about 3 weeks (around 05/30/2020).   Orders:  No orders of the defined types were placed in this encounter.  No orders of the defined types were placed in this encounter.     Procedures: No procedures performed   Clinical Data: No additional findings.  Objective: Vital Signs: Ht 5\' 8"  (1.727 m)   Wt 144 lb (65.3 kg)   LMP 07/25/2014 Comment: perimenopausal  BMI 21.90 kg/m   Physical Exam:   Constitutional: Patient appears well-developed HEENT:  Head: Normocephalic Eyes:EOM are normal Neck: Normal range of  motion Cardiovascular: Normal rate Pulmonary/chest: Effort normal Neurologic: Patient is alert Skin: Skin is warm Psychiatric: Patient has normal mood and affect    Ortho Exam: Ortho exam demonstrates swelling around the lateral malleolus.  Mild medial sided tenderness but stable syndesmosis.  Patient has good ankle dorsiflexion plantarflexion strength on the left-hand side.  No significant instability in the left ankle region.  Foot is perfused.  Specialty Comments:  No specialty comments available.  Imaging: No results found.   PMFS History: Patient Active Problem List   Diagnosis Date Noted  . History of endometrial cancer 03/06/2019  . HTN (hypertension) 04/25/2017  . Goiter 04/25/2017  . Peripheral neuropathy due to chemotherapy ([facility]) 02/25/2017  . Insomnia due to drug ([facility]) 01/12/2017  . Menopausal syndrome (hot flashes) 01/12/2017  . Vitamin D deficiency 12/15/2016  . Secondary malignant neoplasm of right ovary ([facility]) 11/22/2016  . Endometrial cancer ([facility]) 10/28/2016  . Diabetes ([place]) 12/15/2015  . GAD (generalized anxiety disorder) 12/15/2015  . Chronic back pain 12/15/2015  . Tobacco user 09/24/2013   Past Medical History:  Diagnosis Date  . Anxiety   . Arthritis   . Basal cell carcinoma    face  . Diabetes mellitus without complication ([facility])    type II   . Endometrial cancer ([place])   . History of bronchitis   . History of radiation  therapy 01/27/17-02/24/17   vaginal brachytherapy to vaginal cuff 30 Gy in 5 fractions  . Infection 07/2018   Gum infection     Family History  Problem Relation Age of Onset  . Hypertension Mother   . Heart disease Father   . Cancer Father        lung ca  . Arthritis Sister        rheumatoid  . Cancer Brother        prostate    Past Surgical History:  Procedure Laterality Date  . BASAL CELL CARCINOMA EXCISION     face  . GANGLION CYST EXCISION Left 08/06/2014   Procedure: EXCISION LEFT  WRIST GANGLION;  Surgeon: Leanora Cover, MD;  Location: [facility];  Service: Orthopedics;  Laterality: Left;  . IR FLUORO GUIDE PORT INSERTION RIGHT  12/15/2016  . IR REMOVAL TUN ACCESS W/ PORT W/O FL MOD SED  05/12/2017  . IR US GUIDE VASC ACCESS RIGHT  12/15/2016  . KNEE SURGERY     right x3  . ROBOTIC ASSISTED TOTAL HYSTERECTOMY WITH BILATERAL SALPINGO OOPHERECTOMY Bilateral 11/16/2016   Procedure: XI ROBOTIC ASSISTED TOTAL HYSTERECTOMY WITH BILATERAL SALPINGO OOPHORECTOMY;  Surgeon: [name], MD;  [facility];  Service: Gynecology;  Laterality: Bilateral;  . SENTINEL NODE BIOPSY N/A 11/16/2016   Procedure: SENTINEL NODE BIOPSY;  Surgeon: [name], MD;  [facility];  Service: Gynecology;  Laterality: N/A;  . TOOTH EXTRACTION     Social History   Occupational History  . Occupation: Designer, multimedia  Tobacco Use  . Smoking status: Current Every Day Smoker    Packs/day: 0.50    Years: 34.00    Pack years: 17.00    Types: Cigarettes  . Smokeless tobacco: Never Used  Vaping Use  . Vaping Use: Never used  Substance and Sexual Activity  . Alcohol use: No  . Drug use: No  . Sexual activity: Yes

## 2020-05-21 ENCOUNTER — Ambulatory Visit: Payer: BC Managed Care – PPO | Attending: Orthopedic Surgery | Admitting: Physical Therapy

## 2020-05-21 ENCOUNTER — Other Ambulatory Visit: Payer: Self-pay

## 2020-05-21 ENCOUNTER — Encounter: Payer: Self-pay | Admitting: Physical Therapy

## 2020-05-21 DIAGNOSIS — R600 Localized edema: Secondary | ICD-10-CM

## 2020-05-21 DIAGNOSIS — M25672 Stiffness of left ankle, not elsewhere classified: Secondary | ICD-10-CM | POA: Diagnosis not present

## 2020-05-21 DIAGNOSIS — R262 Difficulty in walking, not elsewhere classified: Secondary | ICD-10-CM

## 2020-05-21 DIAGNOSIS — M25572 Pain in left ankle and joints of left foot: Secondary | ICD-10-CM

## 2020-05-21 NOTE — Therapy (Signed)
[name], [place], [address] Phone: [phone]   Fax:  [phone]  Physical Therapy Treatment  Patient Details  Name: [name] MRN: [id number] Date of Birth: 20-Apr-1966 Referring Provider (PT): Meredith Pel, MD   Encounter Date: 05/21/2020   PT End of Session - 05/21/20 1311    Visit Number 1    Number of Visits 8    Date for PT Re-Evaluation 06/25/20    PT Start Time 1030    PT Stop Time 1112    PT Time Calculation (min) 42 min    Equipment Utilized During Treatment Other (comment)   Left ankle ASO   Activity Tolerance Patient tolerated treatment well    Behavior During Therapy [facility] for tasks assessed/performed           Past Medical History:  Diagnosis Date  . Anxiety   . Arthritis   . Basal cell carcinoma    face  . Diabetes mellitus without complication ([place])    type II   . Endometrial cancer ([place])   . History of bronchitis   . History of radiation therapy 01/27/17-02/24/17   vaginal brachytherapy to vaginal cuff 30 Gy in 5 fractions  . Infection 07/2018   Gum infection     Past Surgical History:  Procedure Laterality Date  . BASAL CELL CARCINOMA EXCISION     face  . GANGLION CYST EXCISION Left 08/06/2014   Procedure: EXCISION LEFT  WRIST GANGLION;  Surgeon: Leanora Cover, MD;  Location: [place];  Service: Orthopedics;  Laterality: Left;  . IR FLUORO GUIDE PORT INSERTION RIGHT  12/15/2016  . IR REMOVAL TUN ACCESS W/ PORT W/O FL MOD SED  05/12/2017  . IR US GUIDE VASC ACCESS RIGHT  12/15/2016  . KNEE SURGERY     right x3  . ROBOTIC ASSISTED TOTAL HYSTERECTOMY WITH BILATERAL SALPINGO OOPHERECTOMY Bilateral 11/16/2016   Procedure: XI ROBOTIC ASSISTED TOTAL HYSTERECTOMY WITH BILATERAL SALPINGO OOPHORECTOMY;  Surgeon: [name], MD;  [facility];  Service: Gynecology;  Laterality: Bilateral;  . SENTINEL NODE BIOPSY N/A 11/16/2016   Procedure: SENTINEL NODE  BIOPSY;  Surgeon: [name], MD;  [facility];  Service: Gynecology;  Laterality: N/A;  . TOOTH EXTRACTION      There were no vitals filed for this visit.   Subjective Assessment - 05/21/20 1302    Subjective COVID-19 screening performed upon arrival. Patient arrives to physical therapy with reports of left ankle pain and difficulty walking secondary to a left ankle sprain on 04/18/2020. Patient reports missing the last step going down the steps when she fell. Patient needed husband to assist her to standing. Patient wears a lace up brace during ambulation. Patient reports ability to perform ADLs but with pain. Patient reports pain at worst as 10/10 and pain at best as 0/10 with rest, ice, heat and elevation. Patient's goals are to decrease pain, improve movement, improve strength, and return to work.    Pertinent History Anxiety, DM, L ankle sprain 04/18/2020    Limitations Standing;Walking;House hold activities    How long can you stand comfortably? long enough for ADLs    How long can you walk comfortably? short distances    Diagnostic tests x-ray: see imaging    Patient Stated Goals decrease pain, get back to work    Currently in Pain? Yes    Pain Score 4     Pain Location Ankle    Pain Orientation Left  Pain Descriptors / Indicators Throbbing    Pain Type Acute pain    Pain Onset More than a month ago    Pain Frequency Constant    Aggravating Factors  "being up for long periods"    Pain Relieving Factors "ice or heat, elevated"    Effect of Pain on Daily Activities painful to walk              [facility] PT Assessment - 05/21/20 0001      Assessment   Medical Diagnosis S/P L ankle sprain    Referring Provider (PT) Meredith Pel, MD    Onset Date/Surgical Date 04/18/20    Next MD Visit 05/30/2020    Prior Therapy no      Precautions   Precautions None      Restrictions   Weight Bearing Restrictions No      Balance Screen   Has the patient fallen in the past 6  months Yes    How many times? 1    Has the patient had a decrease in activity level because of a fear of falling?  No    Is the patient reluctant to leave their home because of a fear of falling?  No      Home Ecologist residence    Living Arrangements Spouse/significant other      Prior Function   Level of Independence Independent with basic ADLs    Vocation Requirements 12 hr shifts standing/walking on concrete [employer]       Observation/Other Assessments   Skin Integrity moderate bruising on lateral aspect of foot, 1st to 3rd toes, and plantar aspect of foot.      Observation/Other Assessments-Edema    Edema Figure 8      Figure 8 Edema   Figure 8 - Right  49 cm    Figure 8 - Left  51.1 cm      ROM / Strength   AROM / PROM / Strength AROM;PROM      AROM   Overall AROM  Deficits;Due to pain    AROM Assessment Site Ankle    Right/Left Ankle Left;Right    Right Ankle Dorsiflexion 10    Right Ankle Plantar Flexion 54    Right Ankle Inversion 44    Right Ankle Eversion 6    Left Ankle Dorsiflexion -26   -12 knee flexed   Left Ankle Plantar Flexion 40    Left Ankle Inversion 8    Left Ankle Eversion -8      PROM   Overall PROM  Deficits;Due to pain    PROM Assessment Site Ankle    Right/Left Ankle Left    Left Ankle Dorsiflexion 12   -8   Left Ankle Plantar Flexion 48    Left Ankle Inversion 28    Left Ankle Eversion 6      Palpation   Palpation comment Tender to palpation to L lateral malleolus, ATFL and CFL.      Ambulation/Gait   Gait Pattern Step-to pattern;Decreased step length - right;Decreased stance time - left;Decreased stride length;Decreased dorsiflexion - left;Decreased weight shift to left;Left foot flat;Antalgic                                 PT Education - 05/21/20 1306    Education Details ankle pumps, ankle circles, heel slides, toe towel crunches    Person(s) Educated Patient  Methods  Demonstration;Handout    Comprehension Verbalized understanding;Returned demonstration               PT Long Term Goals - 05/21/20 1423      PT LONG TERM GOAL #1   Title Patient will be independent with HEP    Time 4    Period Weeks    Status New      PT LONG TERM GOAL #2   Title Patient will demonstrate 6+ degrees of left ankle DF AROM to improve gait mechanics.    Time 4    Period Weeks    Status New      PT LONG TERM GOAL #3   Title Patient will demonstrate reciprocating stair negotiation with one railing to safely enter/exit home.    Time 4    Period Weeks    Status New      PT LONG TERM GOAL #4   Title Patient will report ability to perform home activites with left ankle pain less than or equal to 4/10    Time 4    Period Weeks    Status New                 Plan - 05/21/20 1312    Clinical Impression Statement Patient is a 54 year old female who presents to physical therapy with left ankle pain, decreased left ankle ROM, and increased L ankle edema secondary to a left ankle sprain sustained on 04/18/2020. Patient noted with ecchymosis at the lateral aspect of the left foot, at the 1st to 3rd metatarsals and on the plantar surface of the left foot. Patient ambulates wearing an ASO with decreased L DF, decreased L stance time, decreased R step length, and decreased L weight shifting. Patient and PT discussed plan of care and discussed HEP to which patient reported understanding. Patient and PT discussed PT for 1x per week with strong emphasis on HEP due to finances. Patient would benefit from skilled physical therapy to address deficits and address patient's goals.    Personal Factors and Comorbidities Comorbidity 1    Comorbidities Anxiety, DM, L ankle sprain 04/18/2020    Examination-Activity Limitations Stand;Locomotion Level    Examination-Participation Restrictions Occupation    Stability/Clinical Decision Making Stable/Uncomplicated    Clinical Decision Making  Low    Rehab Potential Good    PT Frequency 2x / week   1x/week for 1 week then 2x per week for 3 weeks   PT Duration 4 weeks    PT Treatment/Interventions ADLs/Self Care Home Management;Ultrasound;Electrical Stimulation;Cryotherapy;Therapeutic exercise;Balance training;Neuromuscular re-education;Manual techniques;Passive range of motion;Therapeutic activities;Functional mobility training;Stair training;Gait training;Patient/family education;Vasopneumatic Device;Taping    PT Next Visit Plan nustep, gentle ROM for left ankle, PROM; modalities PRN for pain relief.    PT Home Exercise Plan see patient education section    Consulted and Agree with Plan of Care Patient           Patient will benefit from skilled therapeutic intervention in order to improve the following deficits and impairments:  Abnormal gait, Decreased activity tolerance, Decreased balance, Decreased strength, Increased edema, Difficulty walking, Decreased range of motion, Pain  Visit Diagnosis: Pain in left ankle and joints of left foot - Plan: PT plan of care cert/re-cert  Stiffness of left ankle, not elsewhere classified - Plan: PT plan of care cert/re-cert  Difficulty in walking, not elsewhere classified - Plan: PT plan of care cert/re-cert  Localized edema - Plan: PT plan of care cert/re-cert  Problem List Patient Active Problem List   Diagnosis Date Noted  . History of endometrial cancer 03/06/2019  . HTN (hypertension) 04/25/2017  . Goiter 04/25/2017  . Peripheral neuropathy due to chemotherapy ([place]) 02/25/2017  . Insomnia due to drug ([place]) 01/12/2017  . Menopausal syndrome (hot flashes) 01/12/2017  . Vitamin D deficiency 12/15/2016  . Secondary malignant neoplasm of right ovary ([place]) 11/22/2016  . Endometrial cancer ([place]) 10/28/2016  . Diabetes ([place]) 12/15/2015  . GAD (generalized anxiety disorder) 12/15/2015  . Chronic back pain 12/15/2015  . Tobacco user 09/24/2013    Gabriela Eves, PT,  DPT 05/21/2020, 2:40 PM  [facility] [address] [place], [place], [address] Phone: [phone]   Fax:  [phone]  Name: Kaedance Magos MRN: [id number] Date of Birth: Jan 16, 1966

## 2020-05-27 ENCOUNTER — Ambulatory Visit: Payer: BC Managed Care – PPO | Admitting: Physical Therapy

## 2020-05-29 ENCOUNTER — Other Ambulatory Visit: Payer: Self-pay

## 2020-05-29 ENCOUNTER — Ambulatory Visit: Payer: BC Managed Care – PPO | Admitting: Physical Therapy

## 2020-05-29 ENCOUNTER — Telehealth: Payer: Self-pay | Admitting: Orthopedic Surgery

## 2020-05-29 DIAGNOSIS — M25672 Stiffness of left ankle, not elsewhere classified: Secondary | ICD-10-CM

## 2020-05-29 DIAGNOSIS — R262 Difficulty in walking, not elsewhere classified: Secondary | ICD-10-CM

## 2020-05-29 DIAGNOSIS — R600 Localized edema: Secondary | ICD-10-CM

## 2020-05-29 DIAGNOSIS — M25572 Pain in left ankle and joints of left foot: Secondary | ICD-10-CM

## 2020-05-29 NOTE — Telephone Encounter (Signed)
Received call from [facility] w/ [facility]. Needs records. I faxed [phone], ph [phone]

## 2020-05-29 NOTE — Therapy (Signed)
[facility] [place], [place], [address] Phone: [phone]   Fax:  [phone]  Physical Therapy Treatment  Patient Details  Name: [name] MRN: [id number] Date of Birth: 1966-02-16 Referring Provider (PT): Meredith Pel, MD   Encounter Date: 05/29/2020   PT End of Session - 05/29/20 1007    Visit Number 2    Number of Visits 8    Date for PT Re-Evaluation 06/25/20    PT Start Time 0945    PT Stop Time 1030    PT Time Calculation (min) 45 min    Activity Tolerance Patient tolerated treatment well    Behavior During Therapy [facility] for tasks assessed/performed           Past Medical History:  Diagnosis Date  . Anxiety   . Arthritis   . Basal cell carcinoma    face  . Diabetes mellitus without complication ([place])    type II   . Endometrial cancer ([place])   . History of bronchitis   . History of radiation therapy 01/27/17-02/24/17   vaginal brachytherapy to vaginal cuff 30 Gy in 5 fractions  . Infection 07/2018   Gum infection     Past Surgical History:  Procedure Laterality Date  . BASAL CELL CARCINOMA EXCISION     face  . GANGLION CYST EXCISION Left 08/06/2014   Procedure: EXCISION LEFT  WRIST GANGLION;  Surgeon: Leanora Cover, MD;  Location: [place];  Service: Orthopedics;  Laterality: Left;  . IR FLUORO GUIDE PORT INSERTION RIGHT  12/15/2016  . IR REMOVAL TUN ACCESS W/ PORT W/O FL MOD SED  05/12/2017  . IR US GUIDE VASC ACCESS RIGHT  12/15/2016  . KNEE SURGERY     right x3  . ROBOTIC ASSISTED TOTAL HYSTERECTOMY WITH BILATERAL SALPINGO OOPHERECTOMY Bilateral 11/16/2016   Procedure: XI ROBOTIC ASSISTED TOTAL HYSTERECTOMY WITH BILATERAL SALPINGO OOPHORECTOMY;  Surgeon: [name], MD;  [facility];  Service: Gynecology;  Laterality: Bilateral;  . SENTINEL NODE BIOPSY N/A 11/16/2016   Procedure: SENTINEL NODE BIOPSY;  Surgeon: [name], MD;  [facility];  Service: Gynecology;   Laterality: N/A;  . TOOTH EXTRACTION      There were no vitals filed for this visit.   Subjective Assessment - 05/29/20 0950    Subjective COVID-19 screening performed upon arrival. Patient reported no pain upon arrival    Pertinent History Anxiety, DM, L ankle sprain 04/18/2020    Limitations Standing;Walking;House hold activities    How long can you stand comfortably? long enough for ADLs    How long can you walk comfortably? short distances    Diagnostic tests x-ray: see imaging    Currently in Pain? No/denies    Pain Location Ankle    Pain Orientation Left    Pain Type Acute pain    Pain Onset More than a month ago                             [facility] Adult PT Treatment/Exercise - 05/29/20 0001      Exercises   Exercises Ankle      Modalities   Modalities Vasopneumatic      Vasopneumatic   Number Minutes Vasopneumatic  10 minutes    Vasopnuematic Location  Ankle    Vasopneumatic Pressure Low    Vasopneumatic Temperature  34 for edema      Manual Therapy   Manual Therapy Passive  ROM    Manual therapy comments manual calf stretching with knee flexion and knee straight    Passive ROM manual PROM for all left ankle motions to improve mobility      Ankle Exercises: Aerobic   Nustep 19min L2 UE/LE activity      Ankle Exercises: Standing   Rocker Board 2 minutes   small ROM     Ankle Exercises: Seated   Heel Raises Both;20 reps    Toe Raise 20 reps    BAPS Level 1;Sitting   x26min   Other Seated Ankle Exercises seated dyna disc 2x10                       PT Long Term Goals - 05/29/20 1008      PT LONG TERM GOAL #1   Title Patient will be independent with HEP    Time 4    Period Weeks    Status New      PT LONG TERM GOAL #2   Title Patient will demonstrate 6+ degrees of left ankle DF AROM to improve gait mechanics.    Time 4    Period Weeks    Status On-going      PT LONG TERM GOAL #3   Title Patient will demonstrate  reciprocating stair negotiation with one railing to safely enter/exit home.    Time 4    Period Weeks    Status On-going      PT LONG TERM GOAL #4   Title Patient will report ability to perform home activites with left ankle pain less than or equal to 4/10    Time 4    Period Weeks    Status On-going                 Plan - 05/29/20 1022    Clinical Impression Statement Patient tolerated treatment well today. Patient reported no pain. Patient has not had any pain since monday only edema per reported. Patient has not been wearing ASO in a few days with no pain reported. Patient current goals progressing with tightness with DF today. Good response upon removal of VASO    Personal Factors and Comorbidities Comorbidity 1    Comorbidities Anxiety, DM, L ankle sprain 04/18/2020    Examination-Activity Limitations Stand;Locomotion Level    Examination-Participation Restrictions Occupation    Stability/Clinical Decision Making Stable/Uncomplicated    Rehab Potential Good    PT Frequency 2x / week    PT Duration 4 weeks    PT Treatment/Interventions ADLs/Self Care Home Management;Ultrasound;Electrical Stimulation;Cryotherapy;Therapeutic exercise;Balance training;Neuromuscular re-education;Manual techniques;Passive range of motion;Therapeutic activities;Functional mobility training;Stair training;Gait training;Patient/family education;Vasopneumatic Device;Taping    PT Next Visit Plan nustep, gentle ROM for left ankle, PROM; modalities PRN for pain relief.    Consulted and Agree with Plan of Care Patient           Patient will benefit from skilled therapeutic intervention in order to improve the following deficits and impairments:  Abnormal gait, Decreased activity tolerance, Decreased balance, Decreased strength, Increased edema, Difficulty walking, Decreased range of motion, Pain  Visit Diagnosis: Pain in left ankle and joints of left foot  Stiffness of left ankle, not elsewhere  classified  Difficulty in walking, not elsewhere classified  Localized edema     Problem List Patient Active Problem List   Diagnosis Date Noted  . History of endometrial cancer 03/06/2019  . HTN (hypertension) 04/25/2017  . Goiter 04/25/2017  . Peripheral neuropathy due to chemotherapy (  [place]) 02/25/2017  . Insomnia due to drug ([place]) 01/12/2017  . Menopausal syndrome (hot flashes) 01/12/2017  . Vitamin D deficiency 12/15/2016  . Secondary malignant neoplasm of right ovary ([place]) 11/22/2016  . Endometrial cancer ([place]) 10/28/2016  . Diabetes ([place]) 12/15/2015  . GAD (generalized anxiety disorder) 12/15/2015  . Chronic back pain 12/15/2015  . Tobacco user 09/24/2013    Phillips Climes, PTA 05/29/2020, 10:32 AM  [facility] [place], [place], [address] Phone: [phone]   Fax:  [phone]  Name: [name] MRN: [id number] Date of Birth: 1966-03-18

## 2020-05-30 ENCOUNTER — Encounter: Payer: Self-pay | Admitting: Orthopedic Surgery

## 2020-05-30 ENCOUNTER — Ambulatory Visit (INDEPENDENT_AMBULATORY_CARE_PROVIDER_SITE_OTHER): Payer: BC Managed Care – PPO | Admitting: Orthopedic Surgery

## 2020-05-30 DIAGNOSIS — S93492A Sprain of other ligament of left ankle, initial encounter: Secondary | ICD-10-CM | POA: Diagnosis not present

## 2020-05-31 ENCOUNTER — Encounter: Payer: Self-pay | Admitting: Orthopedic Surgery

## 2020-05-31 NOTE — Progress Notes (Signed)
Office Visit Note   Patient: [name]           Date of Birth: 1965-12-24           MRN: [id number] Visit Date: 05/30/2020 Requested by: [name], [place],   [id number] PCP: [name], FNP  Subjective: Chief Complaint  Patient presents with  . Left Ankle - Follow-up    HPI: [name] is a patient with left lateral ankle sprain.  She had a fibular tip avulsion fracture.  She is getting better with physical therapy but she is not fully there yet in terms of being able to do a lot of prolonged standing.  Not taking anything for pain.              ROS: All systems reviewed are negative as they relate to the chief complaint within the history of present illness.  Patient denies  fevers or chills.   Assessment & Plan: Visit Diagnoses:  1. Sprain of anterior talofibular ligament of left ankle, initial encounter     Plan: Impression is stable ankle with improving range of motion.  I think should be ready to go back to work in a week to do her prolonged standing on the concrete floor.  Follow-up with [place] as needed. Follow-Up Instructions: Return if symptoms worsen or fail to improve.   Orders:  No orders of the defined types were placed in this encounter.  No orders of the defined types were placed in this encounter.     Procedures: No procedures performed   Clinical Data: No additional findings.  Objective: Vital Signs: LMP 07/25/2014 Comment: perimenopausal  Physical Exam:   Constitutional: Patient appears well-developed HEENT:  Head: Normocephalic Eyes:EOM are normal Neck: Normal range of motion Cardiovascular: Normal rate Pulmonary/chest: Effort normal Neurologic: Patient is alert Skin: Skin is warm Psychiatric: Patient has normal mood and affect    Ortho Exam: Ortho exam demonstrates full active and passive range of motion of the right ankle.  On the left-hand side she has about 5 degrees less ankle  dorsiflexion and about 8 degrees less plantar flexion.  Nonetheless has very functional range of motion of the left ankle.  Stable to varus stress.  No tenderness medially.  Slight tenderness laterally.  Expected amount of post injury swelling is present.  Specialty Comments:  No specialty comments available.  Imaging: No results found.   PMFS History: Patient Active Problem List   Diagnosis Date Noted  . History of endometrial cancer 03/06/2019  . HTN (hypertension) 04/25/2017  . Goiter 04/25/2017  . Peripheral neuropathy due to chemotherapy ([place]) 02/25/2017  . Insomnia due to drug ([place]) 01/12/2017  . Menopausal syndrome (hot flashes) 01/12/2017  . Vitamin D deficiency 12/15/2016  . Secondary malignant neoplasm of right ovary ([place]) 11/22/2016  . Endometrial cancer ([place]) 10/28/2016  . Diabetes ([place]) 12/15/2015  . GAD (generalized anxiety disorder) 12/15/2015  . Chronic back pain 12/15/2015  . Tobacco user 09/24/2013   Past Medical History:  Diagnosis Date  . Anxiety   . Arthritis   . Basal cell carcinoma    face  . Diabetes mellitus without complication ([place])    type II   . Endometrial cancer ([place])   . History of bronchitis   . History of radiation therapy 01/27/17-02/24/17   vaginal brachytherapy to vaginal cuff 30 Gy in 5 fractions  . Infection 07/2018   Gum infection     Family History  Problem Relation  Age of Onset  . Hypertension Mother   . Heart disease Father   . Cancer Father        lung ca  . Arthritis Sister        rheumatoid  . Cancer Brother        prostate    Past Surgical History:  Procedure Laterality Date  . BASAL CELL CARCINOMA EXCISION     face  . GANGLION CYST EXCISION Left 08/06/2014   Procedure: EXCISION LEFT  WRIST GANGLION;  Surgeon: Leanora Cover, MD;  Location: [place];  Service: Orthopedics;  Laterality: Left;  . IR FLUORO GUIDE PORT INSERTION RIGHT  12/15/2016  . IR REMOVAL TUN ACCESS W/ PORT W/O FL MOD SED  05/12/2017    . IR US GUIDE VASC ACCESS RIGHT  12/15/2016  . KNEE SURGERY     right x3  . ROBOTIC ASSISTED TOTAL HYSTERECTOMY WITH BILATERAL SALPINGO OOPHERECTOMY Bilateral 11/16/2016   Procedure: XI ROBOTIC ASSISTED TOTAL HYSTERECTOMY WITH BILATERAL SALPINGO OOPHORECTOMY;  Surgeon: [name], MD;  [facility];  Service: Gynecology;  Laterality: Bilateral;  . SENTINEL NODE BIOPSY N/A 11/16/2016   Procedure: SENTINEL NODE BIOPSY;  Surgeon: [name], MD;  [facility];  Service: Gynecology;  Laterality: N/A;  . TOOTH EXTRACTION     Social History   Occupational History  . Occupation: Designer, multimedia  Tobacco Use  . Smoking status: Current Every Day Smoker    Packs/day: 0.50    Years: 34.00    Pack years: 17.00    Types: Cigarettes  . Smokeless tobacco: Never Used  Vaping Use  . Vaping Use: Never used  Substance and Sexual Activity  . Alcohol use: No  . Drug use: No  . Sexual activity: Yes

## 2020-06-03 ENCOUNTER — Telehealth: Payer: Self-pay | Admitting: Orthopedic Surgery

## 2020-06-03 NOTE — Telephone Encounter (Signed)
Ic,lmvm advised we have not received new forms since last completed end of Sept which had out through 10/16.

## 2020-06-03 NOTE — Telephone Encounter (Signed)
Pt called wanting to know if we received any paperwork from [employer]? Pt would like a CB with an update  [phone]

## 2020-06-04 ENCOUNTER — Ambulatory Visit: Payer: BC Managed Care – PPO | Admitting: Physical Therapy

## 2020-08-12 ENCOUNTER — Telehealth: Payer: Self-pay | Admitting: *Deleted

## 2020-08-12 NOTE — Telephone Encounter (Signed)
CALLED PATIENT TO INFORM OF FU WITH DR. Andrey Farmer ON 08-19-20 - ARRIVAL TIME- 3:15 PM, LVM FOR A RETURN CALL

## 2020-08-18 ENCOUNTER — Telehealth: Payer: Self-pay

## 2020-08-18 NOTE — Telephone Encounter (Signed)
LM for [name] that her appointment for 08-20-19 will be cancelled. She needs to call back to the office to reschedule her follow up appointment with Dr. Andrey Farmer.

## 2020-08-19 ENCOUNTER — Ambulatory Visit: Payer: BC Managed Care – PPO | Admitting: Gynecologic Oncology

## 2020-09-25 ENCOUNTER — Ambulatory Visit: Payer: BC Managed Care – PPO | Admitting: Gynecologic Oncology

## 2020-10-24 NOTE — Progress Notes (Signed)
Follow-up Note: Gyn-Onc  Consult was initially requested by Dr. Radene Knee for the evaluation of [name] 55 y.o. female  CC:  Chief Complaint  Patient presents with  . Endometrial cancer [facility])    Assessment/Plan:  Ms. [name]  is a 55 y.o.  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 Disease free on exam.  I will see [name] back in March, 2023. She will see Dr Sondra Come for follow-up in September, 2022.  HPI: [name] is a 55 year old G0 who is seen in consultation at the request of Dr Radene Knee for grade 1 endometrial cancer.  The patient reported post-menopausal bleeding to her physician who performed a TVUS on 10/13/16. This showed a uterus measuring 7.6x4.6x 6.26cm.  The endometrial lining measuringed 2.24cm. The ovaries were normal. A 4cm intracavitary mass was appreciated on [place].  The patient then underwent endometrial pipelle biopsy on 10/13/16. It revealed an endometrioid adenocarcinoma, (FIGO grade 1).   On 11/16/16 she underwent robotic assisted total hysterectomy, BSO, sentinel lymph node biopsy. Final pathology confirmed a stage IIIA grade 1 endometrial cancer with a 5.2cm tumor with 1.2 of 2cm myometrial invasion, no LVSI, and metastases in the right fallopian tube and ovary. The cervix and lymph nodes were benign. The tumor was MSI stable on IHC.  She did well postoperatively with no complaints.  A post-op CT of the chest, abdomen and pelvis showed no gross macroscopic metastatic disease.  She was recommended to receive adjuvant therapy with chemotherapy with 6 cycles of carboplatin paclitaxel with Dr. go such between May 2018 in August 2018.  She also received vaginal cuff brachytherapy, 30 Gy delivered in 5 fractions between the date 01/27/2017 and 02/24/17.  She tolerated adjuvant  therapy well with no complaints.    Interval Hx:  She has no symptoms consistent of recurrence.  Current Meds:  Outpatient Encounter Medications as of 10/27/2020  Medication Sig  . Cyanocobalamin (B-12 PO) Take 1 tablet by mouth daily.  . cyclobenzaprine (FLEXERIL) 10 MG tablet Take 1 tablet (10 mg total) by mouth as needed for muscle spasms.  Marland Kitchen escitalopram (LEXAPRO) 10 MG tablet TAKE 1 TABLET BY MOUTH  DAILY  . Lancets (ONETOUCH ULTRASOFT) lancets Check blood sugars daily Dx E11.9  . metFORMIN (GLUCOPHAGE-XR) 500 MG 24 hr tablet Take 2 tablets (1,000 mg total) by mouth daily. (Patient taking differently: Take 500 mg by mouth 2 (two) times daily.)  . Vitamin D, Ergocalciferol, (DRISDOL) 1.25 MG (50000 UNIT) CAPS capsule Take 50,000 Units by mouth once a week.  . [DISCONTINUED] celecoxib (CELEBREX) 100 MG capsule Take 1 capsule (100 mg total) by mouth 2 (two) times daily.  . [DISCONTINUED] traMADol (ULTRAM) 50 MG tablet Take 1 tablet (50 mg total) by mouth every 12 (twelve) hours as needed.   No facility-administered encounter medications on file as of 10/27/2020.    Allergy:  Allergies  Allergen Reactions  . Amoxicillin-Pot Clavulanate Other (See Comments)    Vomiting, broke out with bumps in the vaginal area.    Social Hx:   Social History   Socioeconomic History  . Marital status: Married    Spouse name: [name]  . Number of children: 0  . Years of education: Not on file  . Highest education level: Not on file  Occupational History  . Occupation: Designer, multimedia  Tobacco Use  . Smoking status:  Current Every Day Smoker    Packs/day: 0.50    Years: 34.00    Pack years: 17.00    Types: Cigarettes  . Smokeless tobacco: Never Used  Vaping Use  . Vaping Use: Never used  Substance and Sexual Activity  . Alcohol use: No  . Drug use: No  . Sexual activity: Yes  Other Topics Concern  . Not on file  Social History Narrative  . Not on file   Social Determinants of Health    Financial Resource Strain: Not on file  Food Insecurity: Not on file  Transportation Needs: Not on file  Physical Activity: Not on file  Stress: Not on file  Social Connections: Not on file  Intimate Partner Violence: Not on file    Past Surgical Hx:  Past Surgical History:  Procedure Laterality Date  . BASAL CELL CARCINOMA EXCISION     face  . GANGLION CYST EXCISION Left 08/06/2014   Procedure: EXCISION LEFT  WRIST GANGLION;  Surgeon: Leanora Cover, MD;  Location: [facility];  Service: Orthopedics;  Laterality: Left;  . IR FLUORO GUIDE PORT INSERTION RIGHT  12/15/2016  . IR REMOVAL TUN ACCESS W/ PORT W/O FL MOD SED  05/12/2017  . IR US GUIDE VASC ACCESS RIGHT  12/15/2016  . KNEE SURGERY     right x3  . ROBOTIC ASSISTED TOTAL HYSTERECTOMY WITH BILATERAL SALPINGO OOPHERECTOMY Bilateral 11/16/2016   Procedure: XI ROBOTIC ASSISTED TOTAL HYSTERECTOMY WITH BILATERAL SALPINGO OOPHORECTOMY;  Surgeon: [name], MD;  Location: [facility];  Service: Gynecology;  Laterality: Bilateral;  . SENTINEL NODE BIOPSY N/A 11/16/2016   Procedure: SENTINEL NODE BIOPSY;  Surgeon: [name], MD;  Location: [facility];  Service: Gynecology;  Laterality: N/A;  . TOOTH EXTRACTION      Past Medical Hx:  Past Medical History:  Diagnosis Date  . Anxiety   . Arthritis   . Basal cell carcinoma    face  . Diabetes mellitus without complication ([place])    type II   . Endometrial cancer ([place])   . History of bronchitis   . History of radiation therapy 01/27/17-02/24/17   vaginal brachytherapy to vaginal cuff 30 Gy in 5 fractions  . Infection 07/2018   Gum infection     Past Gynecological History:  G0 Patient's last menstrual period was 07/25/2014.  Family Hx:  Family History  Problem Relation Age of Onset  . Hypertension Mother   . Heart disease Father   . Cancer Father        lung ca  . Arthritis Sister        rheumatoid  . Cancer Brother        prostate    Review of  Systems:  Constitutional  Feels well,    ENT Normal appearing ears and nares bilaterally Skin/Breast  No rash, sores, jaundice, itching, dryness Cardiovascular  No chest pain, shortness of breath, or edema  Pulmonary  No cough or wheeze.  Gastro Intestinal  No nausea, vomitting, or diarrhoea. No bright red blood per rectum, no abdominal pain, change in bowel movement, or constipation.  Genito Urinary  No frequency, urgency, dysuria, no bleeding Musculo Skeletal  No myalgia, arthralgia, joint swelling or pain  Neurologic  No weakness, numbness, change in gait,  Psychology  No depression, anxiety, insomnia.   Vitals:  Blood pressure 109/67, pulse 80, temperature (!) 97.5 F (36.4 C), temperature source Tympanic, resp. rate 16, height 5' 8.5" (1.74 m), weight 146 lb (66.2 kg), last menstrual  period 07/25/2014, SpO2 100 %.  Physical Exam: WD in NAD Neck  Supple NROM, without any enlargements.  Lymph Node Survey No cervical supraclavicular or inguinal adenopathy Cardiovascular  Pulse normal rate, regularity and rhythm. S1 and S2 normal.  Lungs  Clear to auscultation bilateraly, without wheezes/crackles/rhonchi. Good air movement.  Skin  No rash/lesions/breakdown  Psychiatry  Alert and oriented to person, place, and time  Abdomen  Normoactive bowel sounds, abdomen soft, non-tender and thin without evidence of hernia. Incisions soft, no abdominal masses. Back No CVA tenderness Genito Urinary  Surgically absent uterus, cervix. Vaginal cuff smooth, well healed. No lesions, no blood. Rectal  deferred Extremities  No bilateral cyanosis, clubbing or edema.   Thereasa Solo, MD  10/27/2020, 4:05 PM

## 2020-10-27 ENCOUNTER — Other Ambulatory Visit: Payer: Self-pay

## 2020-10-27 ENCOUNTER — Encounter: Payer: Self-pay | Admitting: Gynecologic Oncology

## 2020-10-27 ENCOUNTER — Inpatient Hospital Stay: Payer: BC Managed Care – PPO | Attending: Gynecologic Oncology | Admitting: Gynecologic Oncology

## 2020-10-27 VITALS — BP 109/67 | HR 80 | Temp 97.5°F | Resp 16 | Ht 68.5 in | Wt 146.0 lb

## 2020-10-27 DIAGNOSIS — Z08 Encounter for follow-up examination after completed treatment for malignant neoplasm: Secondary | ICD-10-CM

## 2020-10-27 DIAGNOSIS — Z9221 Personal history of antineoplastic chemotherapy: Secondary | ICD-10-CM | POA: Insufficient documentation

## 2020-10-27 DIAGNOSIS — Z79899 Other long term (current) drug therapy: Secondary | ICD-10-CM | POA: Diagnosis not present

## 2020-10-27 DIAGNOSIS — Z923 Personal history of irradiation: Secondary | ICD-10-CM | POA: Insufficient documentation

## 2020-10-27 DIAGNOSIS — Z90722 Acquired absence of ovaries, bilateral: Secondary | ICD-10-CM | POA: Diagnosis not present

## 2020-10-27 DIAGNOSIS — F17210 Nicotine dependence, cigarettes, uncomplicated: Secondary | ICD-10-CM | POA: Insufficient documentation

## 2020-10-27 DIAGNOSIS — F419 Anxiety disorder, unspecified: Secondary | ICD-10-CM | POA: Insufficient documentation

## 2020-10-27 DIAGNOSIS — Z7984 Long term (current) use of oral hypoglycemic drugs: Secondary | ICD-10-CM | POA: Insufficient documentation

## 2020-10-27 DIAGNOSIS — C541 Malignant neoplasm of endometrium: Secondary | ICD-10-CM

## 2020-10-27 DIAGNOSIS — Z90710 Acquired absence of both cervix and uterus: Secondary | ICD-10-CM | POA: Diagnosis not present

## 2020-10-27 DIAGNOSIS — Z8542 Personal history of malignant neoplasm of other parts of uterus: Secondary | ICD-10-CM

## 2020-10-27 DIAGNOSIS — E119 Type 2 diabetes mellitus without complications: Secondary | ICD-10-CM | POA: Insufficient documentation

## 2020-10-27 NOTE — Patient Instructions (Signed)
Please notify Dr Denman George at phone number [phone] if you notice vaginal bleeding, new pelvic or abdominal pains, bloating, feeling full easy, or a change in bladder or bowel function.   Please have Dr Clabe Seal office contact Dr Serita Grit office (at [phone]) in September after your appointment with him to request an appointment with Dr Denman George for March, 2022.

## 2021-01-30 ENCOUNTER — Encounter: Payer: Self-pay | Admitting: Radiology

## 2021-02-12 ENCOUNTER — Ambulatory Visit
Admission: RE | Admit: 2021-02-12 | Discharge: 2021-02-12 | Disposition: A | Discharge status: Home / Self Care | Payer: BC Managed Care – PPO | Source: Ambulatory Visit | Attending: Radiation Oncology | Admitting: Radiation Oncology

## 2021-02-17 ENCOUNTER — Telehealth: Payer: Self-pay | Admitting: *Deleted

## 2021-02-17 NOTE — Telephone Encounter (Signed)
RETURNED PATIENT'S PHONE CALL, LVM FOR A RETURN CALLL

## 2021-02-20 ENCOUNTER — Telehealth: Payer: Self-pay | Admitting: *Deleted

## 2021-02-20 NOTE — Telephone Encounter (Signed)
RETURNED PATIENT'S PHONE CALL, VM NOT WORKING UNABLE TO LVM, MAILED APPT. CARD

## 2021-03-11 ENCOUNTER — Telehealth: Payer: Self-pay | Admitting: *Deleted

## 2021-03-11 NOTE — Telephone Encounter (Signed)
CALLED PATIENT AND LVM FOR A RETURN CALL, FU RESCHEDULED FOR 04-06-21 @ 3:15 PM

## 2021-03-12 ENCOUNTER — Ambulatory Visit
Admission: RE | Admit: 2021-03-12 | Discharge: 2021-03-12 | Disposition: A | Discharge status: Home / Self Care | Payer: BC Managed Care – PPO | Source: Ambulatory Visit | Attending: Radiation Oncology | Admitting: Radiation Oncology

## 2021-03-13 ENCOUNTER — Telehealth: Payer: Self-pay | Admitting: *Deleted

## 2021-03-13 NOTE — Telephone Encounter (Signed)
RETURNED PATIENT'S PHONE CALL, LVM FOR A RETURN CALL 

## 2021-04-06 ENCOUNTER — Ambulatory Visit: Payer: Self-pay | Admitting: Radiation Oncology

## 2021-04-07 DIAGNOSIS — Z1231 Encounter for screening mammogram for malignant neoplasm of breast: Secondary | ICD-10-CM | POA: Diagnosis not present

## 2021-04-12 NOTE — Progress Notes (Signed)
Radiation Oncology         [phone] ________________________________  Name: [name] MRN: [id number]  Date: 04/13/2021  DOB: 05-25-1966  Follow-Up Visit Note  CC: [name], FNP  [name], MD       ICD-10-CM    1. Endometrial cancer ([place]) C54.1       Diagnosis:  Stage IIIA endometrial cancer (right ovarian involvement with deep myometrial uterine involvement) - (April 2018)  Interval Since Last Radiation: 4 years, 1 month, and 17 days   Radiation treatment dates:  01/27/2017, 01/31/2017, 02/03/2017, 02/17/2017, 02/24/2017  Site/dose:  The vaginal cuff was treated to 30 Gy delivered in 5 fractions of 6 Gy.  Narrative:  The patient returns today for routine follow-up, she was last seen here for follow-up on 04/10/2020. The patient most recently met with Dr. Denman George on 10/27/20 and was noted at that time to exhibit no evidence of disease recurrence.        Recent bilateral screening mammogram performed at [facility] on 04/07/21 demonstrated no abnormal findings.    Of note: the patient presented to [facility] urgent care on 04/29/20 for a fall which resulted in acute left ankle pain. It was later found that the patient sprained her ankle resulting in a fibular tip avulsion fracture. She was seeing PT for this as of 05/30/20.    Allergies:  is allergic to amoxicillin-pot clavulanate.  Meds: Current Outpatient Medications  Medication Sig Dispense Refill   Cyanocobalamin (B-12 PO) Take 1 tablet by mouth daily.     cyclobenzaprine (FLEXERIL) 10 MG tablet Take 1 tablet (10 mg total) by mouth as needed for muscle spasms. 90 tablet 1   escitalopram (LEXAPRO) 10 MG tablet TAKE 1 TABLET BY MOUTH  DAILY 90 tablet 0   Lancets (ONETOUCH ULTRASOFT) lancets Check blood sugars daily Dx E11.9 100 each 3   metFORMIN (GLUCOPHAGE-XR) 500 MG 24 hr tablet Take 2 tablets (1,000 mg total) by mouth daily. (Patient taking differently: Take 500 mg by mouth 2 (two) times daily.) 120 tablet 5    Vitamin D, Ergocalciferol, (DRISDOL) 1.25 MG (50000 UNIT) CAPS capsule Take 50,000 Units by mouth once a week.     No current facility-administered medications for this encounter.    Physical Findings: The patient is in no acute distress. Patient is alert and oriented.  height is '5\' 8"'  (1.727 m) and weight is 145 lb 4 oz (65.9 kg). Her oral temperature is 98.6 F (37 C). Her blood pressure is 129/57 (abnormal) and her pulse is 78. Her respiration is 18 and oxygen saturation is 99%. .   Lungs are clear to auscultation bilaterally. Heart has regular rate and rhythm. No palpable cervical, supraclavicular, or axillary adenopathy. Abdomen soft, non-tender, normal bowel sounds.  On pelvic examination the external genitalia unremarkable.  A speculum exam is performed.  No mucosal lesions noted in the vaginal vault.  Good view of the vaginal cuff is noted without lesions.  On bimanual examination there are no pelvic masses appreciated.  Vaginal cuff is intact.   Lab Findings: Lab Results  Component Value Date   WBC 6.8 05/28/2019   HGB 13.6 05/28/2019   HCT 40.8 05/28/2019   MCV 90 05/28/2019   PLT 239 05/28/2019    Radiographic Findings: [facility]  Result Date: 04/07/2021 Negative // [facility]    Impression:  Stage IIIA endometrial cancer (right ovarian involvement with deep myometrial uterine involvement) - (April 2018) No evidence of recurrence on clinical exam today.  The patient does not appear to have any long-term effects of her adjuvant treatment.  Plan: Routine follow-up in 1 year.  Patient will follow-up with gynecologic oncology in 6 months.   21 minutes of total time was spent for this patient encounter, including preparation, face-to-face counseling with the patient and coordination of care, physical exam, and documentation of the encounter. ____________________________________  Blair Promise, PhD, MD   This document serves as a record of services personally  performed by Gery Pray, MD. It was created on his behalf by Roney Mans, a trained medical scribe. The creation of this record is based on the scribe's personal observations and the provider's statements to them. This document has been checked and approved by the attending provider.

## 2021-04-13 ENCOUNTER — Ambulatory Visit
Admission: RE | Admit: 2021-04-13 | Discharge: 2021-04-13 | Disposition: A | Discharge status: Home / Self Care | Payer: BC Managed Care – PPO | Source: Ambulatory Visit | Attending: Radiation Oncology | Admitting: Radiation Oncology

## 2021-04-13 ENCOUNTER — Encounter: Payer: Self-pay | Admitting: Radiation Oncology

## 2021-04-13 ENCOUNTER — Other Ambulatory Visit: Payer: Self-pay

## 2021-04-13 VITALS — BP 129/57 | HR 78 | Temp 98.6°F | Resp 18 | Ht 68.0 in | Wt 145.2 lb

## 2021-04-13 DIAGNOSIS — Z923 Personal history of irradiation: Secondary | ICD-10-CM | POA: Insufficient documentation

## 2021-04-13 DIAGNOSIS — Z08 Encounter for follow-up examination after completed treatment for malignant neoplasm: Secondary | ICD-10-CM | POA: Diagnosis not present

## 2021-04-13 DIAGNOSIS — Z79899 Other long term (current) drug therapy: Secondary | ICD-10-CM | POA: Insufficient documentation

## 2021-04-13 DIAGNOSIS — C541 Malignant neoplasm of endometrium: Secondary | ICD-10-CM

## 2021-04-13 DIAGNOSIS — Z8542 Personal history of malignant neoplasm of other parts of uterus: Secondary | ICD-10-CM | POA: Diagnosis not present

## 2021-04-13 DIAGNOSIS — Z7984 Long term (current) use of oral hypoglycemic drugs: Secondary | ICD-10-CM | POA: Diagnosis not present

## 2021-04-13 NOTE — Progress Notes (Signed)
[name] is here today for follow up post radiation to the pelvic.  They completed their radiation on: 02/24/17  Does the patient complain of any of the following:  Pain:Patient denies pain Abdominal bloating: no Diarrhea/Constipation: no Nausea/Vomiting: no Vaginal Discharge: no Blood in Urine or Stool: no Urinary Issues (dysuria/incomplete emptying/ incontinence/ increased frequency/urgency): no Does patient report using vaginal dilator 2-3 times a week and/or sexually active 2-3 weeks: no Post radiation skin changes: no   Additional comments if applicable:   Vitals:   04/13/21 1528  BP: (!) 129/57  Pulse: 78  Resp: 18  Temp: 98.6 F (37 C)  TempSrc: Oral  SpO2: 99%  Weight: 145 lb 4 oz (65.9 kg)  Height: '5\' 8"'$  (1.727 m)

## 2021-05-27 DIAGNOSIS — Z23 Encounter for immunization: Secondary | ICD-10-CM | POA: Diagnosis not present

## 2021-08-11 ENCOUNTER — Telehealth: Payer: Self-pay | Admitting: *Deleted

## 2021-08-11 NOTE — Telephone Encounter (Signed)
CALLED PATIENT TO INFORM OF FU WITH DR. Berline Lopes ON 09/25/21- ARRIVAL TIME- 12:30 PM, UNABLE TO LEAVE MESSAGE DUE TO LINE BEING BUSY, MAILED APPT. CARD

## 2021-09-25 ENCOUNTER — Ambulatory Visit: Payer: BC Managed Care – PPO | Admitting: Gynecologic Oncology

## 2021-10-02 ENCOUNTER — Other Ambulatory Visit: Payer: Self-pay

## 2021-10-02 ENCOUNTER — Encounter: Payer: Self-pay | Admitting: Gynecologic Oncology

## 2021-10-02 ENCOUNTER — Inpatient Hospital Stay: Payer: BC Managed Care – PPO | Attending: Gynecologic Oncology | Admitting: Gynecologic Oncology

## 2021-10-02 VITALS — BP 131/69 | HR 75 | Temp 98.1°F | Resp 18 | Ht 68.31 in | Wt 149.6 lb

## 2021-10-02 DIAGNOSIS — Z923 Personal history of irradiation: Secondary | ICD-10-CM | POA: Diagnosis not present

## 2021-10-02 DIAGNOSIS — Z90710 Acquired absence of both cervix and uterus: Secondary | ICD-10-CM | POA: Diagnosis not present

## 2021-10-02 DIAGNOSIS — Z9221 Personal history of antineoplastic chemotherapy: Secondary | ICD-10-CM | POA: Insufficient documentation

## 2021-10-02 DIAGNOSIS — E119 Type 2 diabetes mellitus without complications: Secondary | ICD-10-CM | POA: Insufficient documentation

## 2021-10-02 DIAGNOSIS — F17210 Nicotine dependence, cigarettes, uncomplicated: Secondary | ICD-10-CM | POA: Diagnosis not present

## 2021-10-02 DIAGNOSIS — Z79899 Other long term (current) drug therapy: Secondary | ICD-10-CM | POA: Diagnosis not present

## 2021-10-02 DIAGNOSIS — Z85828 Personal history of other malignant neoplasm of skin: Secondary | ICD-10-CM | POA: Diagnosis not present

## 2021-10-02 DIAGNOSIS — Z7984 Long term (current) use of oral hypoglycemic drugs: Secondary | ICD-10-CM | POA: Insufficient documentation

## 2021-10-02 DIAGNOSIS — Z90722 Acquired absence of ovaries, bilateral: Secondary | ICD-10-CM | POA: Diagnosis not present

## 2021-10-02 DIAGNOSIS — Z8542 Personal history of malignant neoplasm of other parts of uterus: Secondary | ICD-10-CM | POA: Insufficient documentation

## 2021-10-02 DIAGNOSIS — F419 Anxiety disorder, unspecified: Secondary | ICD-10-CM | POA: Diagnosis not present

## 2021-10-02 DIAGNOSIS — C541 Malignant neoplasm of endometrium: Secondary | ICD-10-CM

## 2021-10-02 NOTE — Patient Instructions (Signed)
It was great to meet you today.  I do not see or feel any evidence of cancer recurrence.  After your next visit with radiation oncology, you will be 5 years out from finishing treatment.  At this time, we transition to visits once a year.  It is still very important to call if you develop any symptoms between visits.  These visits can either be with your primary care doctor or your prior OBGYN.

## 2021-10-02 NOTE — Progress Notes (Signed)
Gynecologic Oncology Return Clinic Visit  10/02/2021  Reason for Visit: Surveillance visit in the setting of endometrial cancer  Treatment History: Oncology History  Endometrial cancer ([place])  09/22/2016 Initial Diagnosis   She presented to her physician with postmenopausal bleeding   10/13/2016 Imaging   The patient reported post-menopausal bleeding to her physician who performed a TVUS on 10/13/16. This showed a uterus measuring 7.6x4.6x 6.26cm. The endometrial lining measuringed 2.24cm. The ovaries were normal. A 4cm intracavitary mass was appreciated on [place].    10/13/2016 Pathology Results   The patient then underwent endometrial pipelle biopsy on 10/13/16. It revealed an endometrioid adenocarcinoma, (FIGO grade 1).    11/16/2016 Pathology Results   1. Lymph node, sentinel, biopsy, right obturator - TWO BENIGN LYMPH NODES (0/2). 2. Lymph node, sentinel, biopsy, right external iliac - ONE BENIGN LYMPH NODE (0/1). 3. Lymph node, sentinel, biopsy, left obturator - ONE BENIGN LYMPH NODE (0/1). 4. Uterus +/- tubes/ovaries, neoplastic - ENDOMETRIOID ADENOCARCINOMA, 5.2 CM. - CARCINOMA FOCALLY INVOLVES DEEP MYOMETRIUM. - RIGHT FALLOPIAN TUBE AND RIGHT OVARY INVOLVED BY ENDOMETRIOID CARCINOMA. - CERVIX, LEFT FALLOPIAN, AND LEFT OVARY FREE OF TUMOR. - SEE ONCOLOGY TABLE AND COMMENT.   11/16/2016 Surgery   Operation: Robotic-assisted laparoscopic total hysterectomy with bilateral salpingoophorectomy with bilateral SLN biopsy   Surgeon: [name]    Operative Findings:  : 7cm normal appearing uterus, normal appearing tubes and ovaries, no suspicious nodes.   11/29/2016 Imaging   1. Interval hysterectomy.  No evidence of metastatic disease. 2. Postsurgical changes in the pelvis with minimal free fluid and soft tissue edema. 3. Stable colon diverticular changes without acute inflammation. 4. Suspected small incidental lesions within the liver and spleen. 5. Stable sclerotic lesion  in the left ischium, likely benign   12/15/2016 Procedure   Successful placement of a right internal jugular approach power injectable Port-A-Cath. The catheter is ready for immediate use   12/22/2016 - 04/15/2017 Chemotherapy   She received carboplatin and Taxol x 6 cycles complicated by poorly controlled DM   01/27/2017 - 02/24/2017 Radiation Therapy   Radiation treatment dates:   01/27/2017, 01/31/2017, 02/03/2017, 02/17/2017, 02/24/2017   Site/dose:   The vagina cuff was treated to 30 Gy delivered in 5 fractions of 6 Gy.   Beams/energy:   HDR Ir-192 Vaginal / The patient was treated with a 2.5 cm diameter cylinder with a 4 cm treatment length.   05/12/2017 Procedure   Removal of implanted Port-A-Cath utilizing sharp and blunt dissection. The procedure was uncomplicated.     Interval History: The patient last saw Dr. Sondra [name] on 04/13/21 and was doing well at that time.  Patient notes doing very well today.  Denies any vaginal bleeding or discharge.  She endorses good appetite without nausea or emesis.  Denies any recent weight changes.  Reports regular bowel and bladder function.  Denies any pain.  Past Medical/Surgical History: Past Medical History:  Diagnosis Date   Anxiety    Arthritis    Basal cell carcinoma    face   Diabetes mellitus without complication ([place])    type II    Endometrial cancer ([place])    History of bronchitis    History of radiation therapy 01/27/17-02/24/17   vaginal brachytherapy to vaginal cuff 30 Gy in 5 fractions  Dr [name]   Infection 07/2018   Gum infection     Past Surgical History:  Procedure Laterality Date   BASAL CELL CARCINOMA EXCISION     face   GANGLION  CYST EXCISION Left 08/06/2014   Procedure: EXCISION LEFT  WRIST GANGLION;  Surgeon: Tammy Cover, MD;  Location: [facility];  Service: Orthopedics;  Laterality: Left;   IR FLUORO GUIDE PORT INSERTION RIGHT  12/15/2016   IR REMOVAL TUN ACCESS W/ PORT W/O FL MOD SED  05/12/2017    IR US GUIDE VASC ACCESS RIGHT  12/15/2016   KNEE SURGERY     right x3   ROBOTIC ASSISTED TOTAL HYSTERECTOMY WITH BILATERAL SALPINGO OOPHERECTOMY Bilateral 11/16/2016   Procedure: XI ROBOTIC ASSISTED TOTAL HYSTERECTOMY WITH BILATERAL SALPINGO OOPHORECTOMY;  Surgeon: Tammy Amber, MD;  [facility];  Service: Gynecology;  Laterality: Bilateral;   SENTINEL NODE BIOPSY N/A 11/16/2016   Procedure: SENTINEL NODE BIOPSY;  Surgeon: Tammy Amber, MD;  [facility];  Service: Gynecology;  Laterality: N/A;   TOOTH EXTRACTION      Family History  Problem Relation Age of Onset   Hypertension Mother    Heart disease Father    Cancer Father        lung ca   Arthritis Sister        rheumatoid   Cancer Brother        prostate    Social History   Socioeconomic History   Marital status: Married    Spouse name: [name]   Number of children: 0   Years of education: Not on file   Highest education level: Not on file  Occupational History   Occupation: Designer, multimedia  Tobacco Use   Smoking status: Every Day    Packs/day: 0.50    Years: 34.00    Pack years: 17.00    Types: Cigarettes   Smokeless tobacco: Never  Vaping Use   Vaping Use: Never used  Substance and Sexual Activity   Alcohol use: No   Drug use: No   Sexual activity: Yes  Other Topics Concern   Not on file  Social History Narrative   Not on file   Social Determinants of Health   Financial Resource Strain: Not on file  Food Insecurity: Not on file  Transportation Needs: Not on file  Physical Activity: Not on file  Stress: Not on file  Social Connections: Not on file    Current Medications:  Current Outpatient Medications:    Cyanocobalamin (B-12 PO), Take 1 tablet by mouth daily., Disp: , Rfl:    cyclobenzaprine (FLEXERIL) 10 MG tablet, Take 1 tablet (10 mg total) by mouth as needed for muscle spasms., Disp: 90 tablet, Rfl: 1   escitalopram (LEXAPRO) 10 MG tablet, TAKE 1 TABLET BY MOUTH  DAILY, Disp: 90 tablet,  Rfl: 0   Lancets (ONETOUCH ULTRASOFT) lancets, Check blood sugars daily Dx E11.9, Disp: 100 each, Rfl: 3   metFORMIN (GLUCOPHAGE-XR) 500 MG 24 hr tablet, Take 2 tablets (1,000 mg total) by mouth daily. (Patient taking differently: Take 500 mg by mouth 2 (two) times daily.), Disp: 120 tablet, Rfl: 5   Vitamin D, Ergocalciferol, (DRISDOL) 1.25 MG (50000 UNIT) CAPS capsule, Take 50,000 Units by mouth once a week. (Patient not taking: Reported on 09/29/2021), Disp: , Rfl:   Review of Systems: Denies appetite changes, fevers, chills, fatigue, unexplained weight changes. Denies hearing loss, neck lumps or masses, mouth sores, ringing in ears or voice changes. Denies cough or wheezing.  Denies shortness of breath. Denies chest pain or palpitations. Denies leg swelling. Denies abdominal distention, pain, blood in stools, constipation, diarrhea, nausea, vomiting, or early satiety. Denies pain with intercourse, dysuria, frequency, hematuria or  incontinence. Denies hot flashes, pelvic pain, vaginal bleeding or vaginal discharge.   Denies joint pain, back pain or muscle pain/cramps. Denies itching, rash, or wounds. Denies dizziness, headaches, numbness or seizures. Denies swollen lymph nodes or glands, denies easy bruising or bleeding. Denies anxiety, depression, confusion, or decreased concentration.  Physical Exam: BP 131/69 (BP Location: Left Arm, Patient Position: Sitting)    Pulse 75    Temp 98.1 F (36.7 C) (Oral)    Resp 18    Ht 5' 8.31" (1.735 m)    Wt 149 lb 9.6 oz (67.9 kg)    LMP 07/25/2014 Comment: perimenopausal   SpO2 97%    BMI 22.54 kg/m  General: Alert, oriented, no acute distress. HEENT: Normocephalic, atraumatic, sclera anicteric. Chest: Clear to auscultation bilaterally.  No wheezes or rhonchi. Cardiovascular: Regular rate and rhythm, no murmurs. Abdomen: Obese, soft, nontender.  Normoactive bowel sounds.  No masses or hepatosplenomegaly appreciated.  Well-healed  incisions. Extremities: Grossly normal range of motion.  Warm, well perfused.  No edema bilaterally. Skin: No rashes or lesions noted. Lymphatics: No cervical, supraclavicular, or inguinal adenopathy. GU: Normal appearing external genitalia without erythema, excoriation, or lesions.  Speculum exam reveals mildly atrophic vaginal mucosa with radiation changes noted, no lesions or masses.  Bimanual exam reveals cuff intact, no nodularity or masses.  Rectovaginal exam confirms these findings.  Laboratory & Radiologic Studies: None new  Assessment & Plan: [name] is a 56 y.o. woman with stage IIIA grade 1 endometrioid endometrial adenocarcinoma, MSI stable, s/p staging in April, 2018, s/p adjuvant therapy with carboplatin/paclitaxel and vaginal brachytherapy completed in August, 2018.  The patient is 4.5 years out from completion of adjuvant therapy and continues to be NED.  Patient will follow up with Dr. Sondra [name] in August and at that time, if she continues to be NED, can be released from surveillance with oncology.  We discussed continued visits with her primary care provider and/or an OB/GYN yearly for review of symptoms and pelvic exam.  She had previously seen Dr. Radene Knee at the time of her diagnosis and we will plan to reestablish care with him.  I asked her to let the office know if she has any difficulty getting an appointment.  We discussed signs and symptoms that would be concerning for disease recurrence and the patient knows the importance of contacting me if she develops any of these.  28 minutes of total time was spent for this patient encounter, including preparation, face-to-face counseling with the patient and coordination of care, and documentation of the encounter.  Jeral Pinch, MD  Division of Gynecologic Oncology  Department of Obstetrics and Gynecology  [facility]

## 2021-10-23 DIAGNOSIS — Z6822 Body mass index (BMI) 22.0-22.9, adult: Secondary | ICD-10-CM | POA: Diagnosis not present

## 2021-10-23 DIAGNOSIS — B349 Viral infection, unspecified: Secondary | ICD-10-CM | POA: Diagnosis not present

## 2021-12-21 ENCOUNTER — Ambulatory Visit: Payer: BC Managed Care – PPO | Admitting: Family

## 2021-12-21 ENCOUNTER — Encounter: Payer: Self-pay | Admitting: Family

## 2021-12-21 VITALS — BP 126/71 | HR 79 | Temp 97.6°F | Ht 68.0 in | Wt 145.8 lb

## 2021-12-21 DIAGNOSIS — E1169 Type 2 diabetes mellitus with other specified complication: Secondary | ICD-10-CM

## 2021-12-21 DIAGNOSIS — R739 Hyperglycemia, unspecified: Secondary | ICD-10-CM | POA: Diagnosis not present

## 2021-12-21 DIAGNOSIS — Z0000 Encounter for general adult medical examination without abnormal findings: Secondary | ICD-10-CM | POA: Diagnosis not present

## 2021-12-21 DIAGNOSIS — G8929 Other chronic pain: Secondary | ICD-10-CM

## 2021-12-21 DIAGNOSIS — Z8542 Personal history of malignant neoplasm of other parts of uterus: Secondary | ICD-10-CM

## 2021-12-21 DIAGNOSIS — I152 Hypertension secondary to endocrine disorders: Secondary | ICD-10-CM

## 2021-12-21 DIAGNOSIS — M545 Low back pain, unspecified: Secondary | ICD-10-CM

## 2021-12-21 DIAGNOSIS — Z0001 Encounter for general adult medical examination with abnormal findings: Secondary | ICD-10-CM

## 2021-12-21 DIAGNOSIS — Z1159 Encounter for screening for other viral diseases: Secondary | ICD-10-CM

## 2021-12-21 DIAGNOSIS — T451X5A Adverse effect of antineoplastic and immunosuppressive drugs, initial encounter: Secondary | ICD-10-CM

## 2021-12-21 DIAGNOSIS — E1159 Type 2 diabetes mellitus with other circulatory complications: Secondary | ICD-10-CM | POA: Diagnosis not present

## 2021-12-21 DIAGNOSIS — F411 Generalized anxiety disorder: Secondary | ICD-10-CM

## 2021-12-21 DIAGNOSIS — G620 Drug-induced polyneuropathy: Secondary | ICD-10-CM

## 2021-12-21 DIAGNOSIS — F19982 Other psychoactive substance use, unspecified with psychoactive substance-induced sleep disorder: Secondary | ICD-10-CM

## 2021-12-21 DIAGNOSIS — Z720 Tobacco use: Secondary | ICD-10-CM

## 2021-12-21 DIAGNOSIS — E559 Vitamin D deficiency, unspecified: Secondary | ICD-10-CM

## 2021-12-21 LAB — BAYER DCA HB A1C WAIVED: HB A1C (BAYER DCA - WAIVED): 6.4 % — ABNORMAL HIGH (ref 4.8–5.6)

## 2021-12-21 MED ORDER — LISINOPRIL 2.5 MG PO TABS: 2.5000 mg | ORAL_TABLET | Freq: Every day | ORAL | 3 refills | Status: DC

## 2021-12-21 NOTE — Progress Notes (Signed)
? ?Subjective:  ? ? Patient ID: [name], female    DOB: 08/18/1965, 56 y.o.   MRN: [id number] ? ?Chief Complaint  ?Patient presents with  ? Medical Management of Chronic Issues  ?  [place], has not been seen since 2020  ? ?PT presents to the office today for CPE and chronic follow up. She has had lab work drawn at work. We will scan into his chart. Her last A1C was 7.0. ? ?She has hx endometrial cancer in 2018. She is still followed by Oncologists  annually. She has peripheral neuropathy from the chemo.  ?Hypertension ?This is a chronic problem. The current episode started more than 1 year ago. The problem has been resolved since onset. The problem is controlled. Pertinent negatives include no blurred vision, malaise/fatigue, peripheral edema or shortness of breath. Risk factors for coronary artery disease include dyslipidemia, diabetes mellitus, smoking/tobacco exposure and sedentary lifestyle. The current treatment provides moderate improvement. There is no history of CVA.  ?Diabetes ?She presents for her follow-up diabetic visit. She has type 2 diabetes mellitus. There are no hypoglycemic associated symptoms. Pertinent negatives for hypoglycemia include no nervousness/anxiousness. Associated symptoms include foot paresthesias. Pertinent negatives for diabetes include no blurred vision. Symptoms are stable. Diabetic complications include peripheral neuropathy. Pertinent negatives for diabetic complications include no CVA or heart disease. Risk factors for coronary artery disease include dyslipidemia, diabetes mellitus, hypertension and sedentary lifestyle. She is following a generally healthy diet. Her overall blood glucose range is 130-140 mg/dl.  ?Insomnia ?Primary symptoms: difficulty falling asleep, no malaise/fatigue.   ?The current episode started more than one year.  ?Anxiety ?Presents for follow-up visit. Symptoms include insomnia. Patient reports no decreased concentration, dry mouth,  irritability, nervous/anxious behavior or shortness of breath. Symptoms occur occasionally. The severity of symptoms is moderate.  ? ? ?Back Pain ?This is a chronic problem. The current episode started more than 1 year ago. The problem occurs intermittently. The problem has been waxing and waning since onset. The pain is present in the lumbar spine. The quality of the pain is described as aching. The pain is at a severity of 2/10. The pain is mild.  ?Nicotine Dependence ?Presents for follow-up visit. Symptoms include insomnia. Symptoms are negative for decreased concentration and irritability. Her urge triggers include company of smokers. The symptoms have been stable. She smokes < 1/2 a pack of cigarettes per day.  ? ? ? ?Review of Systems  ?Constitutional:  Negative for irritability and malaise/fatigue.  ?Eyes:  Negative for blurred vision.  ?Respiratory:  Negative for shortness of breath.   ?Musculoskeletal:  Positive for back pain.  ?Psychiatric/Behavioral:  Negative for decreased concentration. The patient has insomnia. The patient is not nervous/anxious.   ?All other systems reviewed and are negative. ? ?Family History  ?Problem Relation Age of Onset  ? Hypertension Mother   ? Heart disease Father   ? Cancer Father   ?     lung ca  ? Arthritis Sister   ?     rheumatoid  ? Cancer Brother   ?     prostate  ? ?Social History  ? ?Socioeconomic History  ? Marital status: Married  ?  Spouse name: Aaron Edelman  ? Number of children: 0  ? Years of education: Not on file  ? Highest education level: Not on file  ?Occupational History  ? Occupation: Designer, multimedia  ?Tobacco Use  ? Smoking status: Every Day  ?  Packs/day: 0.50  ?  Years: 34.00  ?  Pack years: 17.00  ?  Types: Cigarettes  ? Smokeless tobacco: Never  ?Vaping Use  ? Vaping Use: Never used  ?Substance and Sexual Activity  ? Alcohol use: No  ? Drug use: No  ? Sexual activity: Yes  ?Other Topics Concern  ? Not on file  ?Social History Narrative  ? Not on file   ? ?Social Determinants of Health  ? ?Financial Resource Strain: Not on file  ?Food Insecurity: Not on file  ?Transportation Needs: Not on file  ?Physical Activity: Not on file  ?Stress: Not on file  ?Social Connections: Not on file  ? ? ?   ?Objective:  ? Physical Exam ?Vitals reviewed.  ?Constitutional:   ?   General: She is not in acute distress. ?   Appearance: She is well-developed.  ?HENT:  ?   Head: Normocephalic and atraumatic.  ?   Right Ear: Tympanic membrane normal.  ?   Left Ear: Tympanic membrane normal.  ?Eyes:  ?   Pupils: Pupils are equal, round, and reactive to light.  ?Neck:  ?   Thyroid: No thyromegaly.  ?Cardiovascular:  ?   Rate and Rhythm: Normal rate and regular rhythm.  ?   Heart sounds: Normal heart sounds. No murmur heard. ?Pulmonary:  ?   Effort: Pulmonary effort is normal. No respiratory distress.  ?   Breath sounds: Normal breath sounds. No wheezing.  ?Abdominal:  ?   General: Bowel sounds are normal. There is no distension.  ?   Palpations: Abdomen is soft.  ?   Tenderness: There is no abdominal tenderness.  ?Musculoskeletal:     ?   General: No tenderness. Normal range of motion.  ?   Cervical back: Normal range of motion and neck supple.  ?Skin: ?   General: Skin is warm and dry.  ?Neurological:  ?   Mental Status: She is alert and oriented to person, place, and time.  ?   Cranial Nerves: No cranial nerve deficit.  ?   Deep Tendon Reflexes: Reflexes are normal and symmetric.  ?Psychiatric:     ?   Behavior: Behavior normal.     ?   Thought Content: Thought content normal.     ?   Judgment: Judgment normal.  ? ? ? ? ? ?  BP 126/71 (BP Location: Right Arm)   Pulse 79   Temp 97.6 ?F (36.4 ?C) (Oral)   Ht '5\' 8"'  (1.727 m)   Wt 145 lb 12.8 oz (66.1 kg)   LMP 07/25/2014 Comment: perimenopausal  SpO2 96%   BMI 22.17 kg/m?  ? ?Assessment & Plan:  ?[name] comes in today with chief complaint of Medical Management of Chronic Issues ([place], has not been seen since  2020) ? ? ?Diagnosis and orders addressed: ? ?1. Annual physical exam ?- CMP14+EGFR ?- Bayer DCA Hb A1c Waived ?- Hepatitis C antibody ?- Microalbumin / creatinine urine ratio ?- TSH ? ?2. Hypertension associated with diabetes ([place]) ?- CMP14+EGFR ? ?3. Type 2 diabetes mellitus with other specified complication, without long-term current use of insulin (HCC) ?- lisinopril (ZESTRIL) 2.5 MG tablet; Take 1 tablet (2.5 mg total) by mouth daily.  Dispense: 90 tablet; Refill: 3 ?- CMP14+EGFR ?- Microalbumin / creatinine urine ratio ? ?4. Chronic bilateral low back pain without sciatica ?- CMP14+EGFR ? ?5. GAD (generalized anxiety disorder) ?- CMP14+EGFR ? ?6. Insomnia due to drug [facility]) ?- CMP14+EGFR ? ?7. History of endometrial cancer ?- CMP14+EGFR ? ?8. Tobacco user ?- CMP14+EGFR ? ?  9. Vitamin D deficiency ?- CMP14+EGFR ? ?10. Peripheral neuropathy due to chemotherapy [facility]) ?- CMP14+EGFR ? ?11. Need for hepatitis C screening test ?- CMP14+EGFR ?- Hepatitis C antibody ? ? ?Labs pending ?Health Maintenance reviewed ?Diet and exercise encouraged ? ?Follow up plan: ?6 months  ? ? ?Evelina Dun, FNP ? ? ? ?

## 2021-12-21 NOTE — Patient Instructions (Signed)

## 2021-12-22 LAB — CMP14+EGFR
ALT: 10 IU/L (ref 0–32)
AST: 12 IU/L (ref 0–40)
Albumin/Globulin Ratio: 1.8 (ref 1.2–2.2)
Albumin: 4.4 g/dL (ref 3.8–4.9)
Alkaline Phosphatase: 87 IU/L (ref 44–121)
BUN/Creatinine Ratio: 19 (ref 9–23)
BUN: 16 mg/dL (ref 6–24)
Bilirubin Total: 1 mg/dL (ref 0.0–1.2)
CO2: 24 mmol/L (ref 20–29)
Calcium: 9.8 mg/dL (ref 8.7–10.2)
Chloride: 102 mmol/L (ref 96–106)
Creatinine, Ser: 0.83 mg/dL (ref 0.57–1.00)
Globulin, Total: 2.4 g/dL (ref 1.5–4.5)
Glucose: 135 mg/dL — ABNORMAL HIGH (ref 70–99)
Potassium: 4.3 mmol/L (ref 3.5–5.2)
Sodium: 140 mmol/L (ref 134–144)
Total Protein: 6.8 g/dL (ref 6.0–8.5)
eGFR: 83 mL/min/{1.73_m2} (ref >59)

## 2021-12-22 LAB — MICROALBUMIN / CREATININE URINE RATIO
Creatinine, Urine: 29.4 mg/dL
Microalb/Creat Ratio: <10 mg/g creat (ref 0–29)
Microalbumin, Urine: <3 ug/mL

## 2021-12-22 LAB — TSH: TSH: 1.47 u[IU]/mL (ref 0.450–4.500)

## 2021-12-22 LAB — HEPATITIS C ANTIBODY: Hep C Virus Ab: NONREACTIVE

## 2021-12-22 NOTE — Progress Notes (Signed)
Patient returning call. Please call back

## 2022-04-08 ENCOUNTER — Telehealth: Payer: Self-pay | Admitting: *Deleted

## 2022-04-08 NOTE — Telephone Encounter (Signed)
RETURNED PATIENT'S PHONE CALL, SPOKE WITH PATIENT. ?

## 2022-04-12 ENCOUNTER — Encounter: Payer: Self-pay | Admitting: Radiation Oncology

## 2022-04-12 ENCOUNTER — Other Ambulatory Visit: Payer: Self-pay

## 2022-04-12 ENCOUNTER — Ambulatory Visit
Admission: RE | Admit: 2022-04-12 | Discharge: 2022-04-12 | Disposition: A | Discharge status: Home / Self Care | Payer: BC Managed Care – PPO | Source: Ambulatory Visit | Attending: Radiation Oncology | Admitting: Radiation Oncology

## 2022-04-12 DIAGNOSIS — Z923 Personal history of irradiation: Secondary | ICD-10-CM | POA: Insufficient documentation

## 2022-04-12 DIAGNOSIS — Z8542 Personal history of malignant neoplasm of other parts of uterus: Secondary | ICD-10-CM | POA: Diagnosis not present

## 2022-04-12 DIAGNOSIS — Z79899 Other long term (current) drug therapy: Secondary | ICD-10-CM | POA: Diagnosis not present

## 2022-04-12 NOTE — Progress Notes (Signed)
[name] is here today for follow up post radiation to the pelvic.  They completed their radiation on: 02/24/17  Does the patient complain of any of the following:  Pain: No Abdominal bloating: No Diarrhea/Constipation: No Nausea/Vomiting: No Vaginal Discharge: No Blood in Urine or Stool: No Urinary Issues (dysuria/incomplete emptying/ incontinence/ increased frequency/urgency): No Does patient report using vaginal dilator 2-3 times a week and/or sexually active 2-3 weeks: No Post radiation skin changes: No   Additional comments if applicable:    BP 142/76 (BP Location: Right Arm, Patient Position: Sitting, Cuff Size: Normal)   Pulse 78   Temp 97.7 F (36.5 C) (Oral)   Resp 19   Ht '5\' 8"'$  (1.727 m)   Wt 146 lb (66.2 kg)   LMP 07/25/2014 Comment: perimenopausal  SpO2 99%   BMI 22.20 kg/m

## 2022-04-12 NOTE — Progress Notes (Signed)
Radiation Oncology         [phone] ________________________________  Name: [name] MRN: [id number]  Date: 04/12/2022  DOB: [birth year]  Follow-Up Visit Note  CC: [name], FNP  [name], MD    ICD-10-CM   1. History of endometrial cancer  Z85.42       Diagnosis: Stage IIIA endometrial cancer (right ovarian involvement with deep myometrial uterine involvement) - (April 2018)  Interval Since Last Radiation: 5 years, 1 month, and 16 days   Radiation treatment dates:  01/27/2017, 01/31/2017, 02/03/2017, 02/17/2017, 02/24/2017   Site/dose:  The vaginal cuff was treated to 30 Gy delivered in 5 fractions of 6 Gy.  Narrative:  The patient returns today for routine annual follow-up, she was last seen here for follow up on 04/13/22. Since her last visit, the patient followed up with Dr. Berline Lopes on 10/12/21. During which time, the patient denied any symptoms concerning for disease recurrence and was noted as NED on examination.   Otherwise, no significant interval history since the patient was last seen for follow-up.  She denies any new medical issues.  Since her last follow-up she denies any vaginal bleeding or discharge.  She denies any pelvic pain or abdominal bloating.  She denies any hematuria or rectal bleeding.                             Allergies:  is allergic to amoxicillin-pot clavulanate, codeine, and rosuvastatin.  Meds: Current Outpatient Medications  Medication Sig Dispense Refill   Cholecalciferol 50 MCG (2000 UT) CAPS Take 1 capsule by mouth daily.     Cyanocobalamin (B-12 PO) Take 1 tablet by mouth daily.     cyclobenzaprine (FLEXERIL) 10 MG tablet Take 1 tablet (10 mg total) by mouth as needed for muscle spasms. 90 tablet 1   escitalopram (LEXAPRO) 10 MG tablet TAKE 1 TABLET BY MOUTH  DAILY 90 tablet 0   fluticasone (FLONASE) 50 MCG/ACT nasal spray Place 1-2 sprays into both nostrils daily as needed.     Lancets (ONETOUCH ULTRASOFT) lancets  Check blood sugars daily Dx E11.9 100 each 3   lisinopril (ZESTRIL) 2.5 MG tablet Take 1 tablet (2.5 mg total) by mouth daily. 90 tablet 3   Magnesium 400 MG CAPS Take 1 tablet by mouth daily.     metFORMIN (GLUCOPHAGE) 500 MG tablet Take 500 mg by mouth 2 (two) times daily with a meal.     vitamin B-12 (CYANOCOBALAMIN) 1000 MCG tablet Take 1 tablet by mouth daily.     Vitamin D, Ergocalciferol, (DRISDOL) 1.25 MG (50000 UNIT) CAPS capsule Take 50,000 Units by mouth once a week. (Patient not taking: Reported on 04/12/2022)     No current facility-administered medications for this encounter.    Physical Findings: The patient is in no acute distress. Patient is alert and oriented.  height is '5\' 8"'$  (1.727 m) and weight is 146 lb (66.2 kg). Her oral temperature is 97.7 F (36.5 C). Her blood pressure is 126/74 and her pulse is 78. Her respiration is 19 and oxygen saturation is 99%. .   Lungs are clear to auscultation bilaterally. Heart has regular rate and rhythm. No palpable cervical, supraclavicular, or axillary adenopathy. Abdomen soft, non-tender, normal bowel sounds.  On pelvic examination the external genitalia were unremarkable. A speculum exam was performed. There are no mucosal lesions noted in the vaginal vault.  Some radiation changes noted in the region of the  vaginal cuff.  On bimanual and rectovaginal examination there were no pelvic masses appreciated.  Rectal sphincter tone good.    Lab Findings: Lab Results  Component Value Date   WBC 6.8 05/28/2019   HGB 13.6 05/28/2019   HCT 40.8 05/28/2019   MCV 90 05/28/2019   PLT 239 05/28/2019    Radiographic Findings: No results found.  Impression:  Stage IIIA endometrial cancer (right ovarian involvement with deep myometrial uterine involvement) - (April 2018)  No evidence of recurrence on clinical exam today.  She does not appear to be exhibiting any long-term effects from her surgery or vaginal brachytherapy.  I congratulated  her on a successful 5-year follow-up without recurrence.  Plan: As needed follow-up in radiation oncology.  I have recommended she call Dr. Ophelia Charter office to schedule a follow-up in approximately 1 year.   25 minutes of total time was spent for this patient encounter, including preparation, face-to-face counseling with the patient and coordination of care, physical exam, and documentation of the encounter. ____________________________________  Blair Promise, PhD, MD  This document serves as a record of services personally performed by Gery Pray, MD. It was created on his behalf by Roney Mans, a trained medical scribe. The creation of this record is based on the scribe's personal observations and the provider's statements to them. This document has been checked and approved by the attending provider.

## 2022-04-15 DIAGNOSIS — Z1231 Encounter for screening mammogram for malignant neoplasm of breast: Secondary | ICD-10-CM | POA: Diagnosis not present

## 2022-05-31 DIAGNOSIS — Z23 Encounter for immunization: Secondary | ICD-10-CM | POA: Diagnosis not present

## 2022-06-22 ENCOUNTER — Ambulatory Visit: Payer: BC Managed Care – PPO | Admitting: Family

## 2022-07-19 ENCOUNTER — Ambulatory Visit: Payer: BC Managed Care – PPO | Admitting: Family

## 2022-07-19 ENCOUNTER — Encounter: Payer: Self-pay | Admitting: Family

## 2022-07-19 VITALS — BP 109/64 | HR 80 | Temp 97.5°F | Ht 68.0 in | Wt 147.0 lb

## 2022-07-19 DIAGNOSIS — F411 Generalized anxiety disorder: Secondary | ICD-10-CM

## 2022-07-19 DIAGNOSIS — Z720 Tobacco use: Secondary | ICD-10-CM | POA: Diagnosis not present

## 2022-07-19 DIAGNOSIS — E1169 Type 2 diabetes mellitus with other specified complication: Secondary | ICD-10-CM | POA: Diagnosis not present

## 2022-07-19 DIAGNOSIS — E559 Vitamin D deficiency, unspecified: Secondary | ICD-10-CM

## 2022-07-19 DIAGNOSIS — G8929 Other chronic pain: Secondary | ICD-10-CM

## 2022-07-19 DIAGNOSIS — E119 Type 2 diabetes mellitus without complications: Secondary | ICD-10-CM | POA: Insufficient documentation

## 2022-07-19 DIAGNOSIS — F19982 Other psychoactive substance use, unspecified with psychoactive substance-induced sleep disorder: Secondary | ICD-10-CM

## 2022-07-19 DIAGNOSIS — T451X5A Adverse effect of antineoplastic and immunosuppressive drugs, initial encounter: Secondary | ICD-10-CM

## 2022-07-19 DIAGNOSIS — I152 Hypertension secondary to endocrine disorders: Secondary | ICD-10-CM

## 2022-07-19 DIAGNOSIS — M545 Low back pain, unspecified: Secondary | ICD-10-CM

## 2022-07-19 DIAGNOSIS — E1159 Type 2 diabetes mellitus with other circulatory complications: Secondary | ICD-10-CM

## 2022-07-19 DIAGNOSIS — G620 Drug-induced polyneuropathy: Secondary | ICD-10-CM

## 2022-07-19 LAB — BAYER DCA HB A1C WAIVED: HB A1C (BAYER DCA - WAIVED): 6.8 % — ABNORMAL HIGH (ref 4.8–5.6)

## 2022-07-19 MED ORDER — ESCITALOPRAM OXALATE 10 MG PO TABS: 10.0000 mg | ORAL_TABLET | Freq: Every day | ORAL | 1 refills | Status: DC

## 2022-07-19 NOTE — Patient Instructions (Signed)

## 2022-07-19 NOTE — Progress Notes (Signed)
Subjective:    Patient ID: [name], female    DOB: 10-24-1965, 56 y.o.   MRN: [id number]   Chief Complaint  Patient presents with   Medical Management of Chronic Issues   PT presents to the office today for chronic follow up. She has had lab work drawn at work.    She has hx endometrial cancer in 2018. She is still followed by Oncologists  annually. She has peripheral neuropathy from the chemo.  Hypertension This is a chronic problem. The current episode started more than 1 year ago. The problem has been resolved since onset. The problem is controlled. Associated symptoms include anxiety and malaise/fatigue. Pertinent negatives include no blurred vision, peripheral edema or shortness of breath. Risk factors for coronary artery disease include dyslipidemia. The current treatment provides moderate improvement.  Diabetes She presents for her follow-up diabetic visit. She has type 2 diabetes mellitus. Hypoglycemia symptoms include nervousness/anxiousness. Pertinent negatives for diabetes include no blurred vision and no foot paresthesias. Symptoms are stable. Risk factors for coronary artery disease include dyslipidemia, diabetes mellitus, hypertension, sedentary lifestyle and post-menopausal. She is following a diabetic diet. Her overall blood glucose range is 110-130 mg/dl. Eye exam is not current.  Insomnia Primary symptoms: difficulty falling asleep, frequent awakening, malaise/fatigue.   The current episode started more than one year. The onset quality is gradual. The problem occurs intermittently. Past treatments include meditation. The treatment provided moderate relief.  Anxiety Presents for follow-up visit. Symptoms include excessive worry, insomnia, irritability and nervous/anxious behavior. Patient reports no shortness of breath. Symptoms occur occasionally. The severity of symptoms is moderate. The quality of sleep is good.    Back Pain This is a chronic problem. The  current episode started more than 1 year ago. The problem occurs intermittently. The problem has been waxing and waning since onset. The pain is present in the lumbar spine. The quality of the pain is described as aching. The pain is at a severity of 6/10. The pain is mild.  Nicotine Dependence Presents for follow-up visit. Symptoms include insomnia and irritability. Her urge triggers include company of smokers. The symptoms have been stable. She smokes < 1/2 a pack of cigarettes per day.      Review of Systems  Constitutional:  Positive for irritability and malaise/fatigue.  Eyes:  Negative for blurred vision.  Respiratory:  Negative for shortness of breath.   Musculoskeletal:  Positive for back pain.  Psychiatric/Behavioral:  The patient is nervous/anxious and has insomnia.   All other systems reviewed and are negative.      Objective:   Physical Exam Vitals reviewed.  Constitutional:      General: She is not in acute distress.    Appearance: She is well-developed.  HENT:     Head: Normocephalic and atraumatic.     Right Ear: Tympanic membrane normal.     Left Ear: Tympanic membrane normal.  Eyes:     Pupils: Pupils are equal, round, and reactive to light.  Neck:     Thyroid: No thyromegaly.  Cardiovascular:     Rate and Rhythm: Normal rate and regular rhythm.     Heart sounds: Normal heart sounds. No murmur heard. Pulmonary:     Effort: Pulmonary effort is normal. No respiratory distress.     Breath sounds: Normal breath sounds. No wheezing.  Abdominal:     General: Bowel sounds are normal. There is no distension.     Palpations: Abdomen is soft.     Tenderness:  There is no abdominal tenderness.  Musculoskeletal:        General: No tenderness. Normal range of motion.     Cervical back: Normal range of motion and neck supple.  Skin:    General: Skin is warm and dry.  Neurological:     Mental Status: She is alert and oriented to person, place, and time.     Cranial  Nerves: No cranial nerve deficit.     Deep Tendon Reflexes: Reflexes are normal and symmetric.  Psychiatric:        Behavior: Behavior normal.        Thought Content: Thought content normal.        Judgment: Judgment normal.        BP 109/64   Pulse 80   Temp (!) 97.5 F (36.4 C) (Temporal)   Ht _0  (1.727 m)   Wt 147 lb (66.7 kg)   LMP 07/25/2014 Comment: perimenopausal  SpO2 100%   BMI 22.35 kg/m    Assessment & Plan:  [name] comes in today with chief complaint of Medical Management of Chronic Issues   Diagnosis and orders addressed:  1. GAD (generalized anxiety disorder) - escitalopram (LEXAPRO) 10 MG tablet; Take 1 tablet (10 mg total) by mouth daily.  Dispense: 90 tablet; Refill: 1 - CMP14+EGFR  2. Vitamin D deficiency - CMP14+EGFR  3. Tobacco user - CMP14+EGFR  4. Chronic bilateral low back pain without sciatica - CMP14+EGFR  5. Hypertension associated with diabetes ([place]) - CMP14+EGFR  6. Peripheral neuropathy due to chemotherapy (HCC) - CMP14+EGFR  7. Insomnia due to drug ([place]) - CMP14+EGFR  8. Type 2 diabetes mellitus with other specified complication, without long-term current use of insulin ([place]) - Bayer [place] Hb A1c Waived   Labs pending Health Maintenance reviewed Diet and exercise encouraged  Follow up plan: 6 months    Evelina Dun, FNP

## 2022-07-20 LAB — CMP14+EGFR
ALT: 11 IU/L (ref 0–32)
AST: 15 IU/L (ref 0–40)
Albumin/Globulin Ratio: 1.8 (ref 1.2–2.2)
Albumin: 4.2 g/dL (ref 3.8–4.9)
Alkaline Phosphatase: 82 IU/L (ref 44–121)
BUN/Creatinine Ratio: 18 (ref 9–23)
BUN: 17 mg/dL (ref 6–24)
Bilirubin Total: 0.7 mg/dL (ref 0.0–1.2)
CO2: 23 mmol/L (ref 20–29)
Calcium: 9.7 mg/dL (ref 8.7–10.2)
Chloride: 103 mmol/L (ref 96–106)
Creatinine, Ser: 0.94 mg/dL (ref 0.57–1.00)
Globulin, Total: 2.3 g/dL (ref 1.5–4.5)
Glucose: 124 mg/dL — ABNORMAL HIGH (ref 70–99)
Potassium: 4.4 mmol/L (ref 3.5–5.2)
Sodium: 141 mmol/L (ref 134–144)
Total Protein: 6.5 g/dL (ref 6.0–8.5)
eGFR: 71 mL/min/{1.73_m2} (ref >59)

## 2022-08-26 ENCOUNTER — Encounter: Payer: Self-pay | Admitting: *Deleted

## 2022-11-16 ENCOUNTER — Ambulatory Visit: Payer: BC Managed Care – PPO | Admitting: Family

## 2022-11-18 ENCOUNTER — Ambulatory Visit: Payer: BC Managed Care – PPO | Admitting: Family

## 2022-11-23 ENCOUNTER — Ambulatory Visit: Payer: BC Managed Care – PPO | Admitting: Family

## 2022-11-23 ENCOUNTER — Encounter: Payer: Self-pay | Admitting: Family

## 2022-11-23 VITALS — BP 125/78 | HR 73 | Temp 97.1°F | Ht 68.0 in | Wt 146.2 lb

## 2022-11-23 DIAGNOSIS — Z720 Tobacco use: Secondary | ICD-10-CM

## 2022-11-23 DIAGNOSIS — G620 Drug-induced polyneuropathy: Secondary | ICD-10-CM

## 2022-11-23 DIAGNOSIS — I152 Hypertension secondary to endocrine disorders: Secondary | ICD-10-CM

## 2022-11-23 DIAGNOSIS — F411 Generalized anxiety disorder: Secondary | ICD-10-CM

## 2022-11-23 DIAGNOSIS — E1169 Type 2 diabetes mellitus with other specified complication: Secondary | ICD-10-CM

## 2022-11-23 DIAGNOSIS — E1159 Type 2 diabetes mellitus with other circulatory complications: Secondary | ICD-10-CM

## 2022-11-23 DIAGNOSIS — G8929 Other chronic pain: Secondary | ICD-10-CM

## 2022-11-23 DIAGNOSIS — E559 Vitamin D deficiency, unspecified: Secondary | ICD-10-CM

## 2022-11-23 DIAGNOSIS — T451X5A Adverse effect of antineoplastic and immunosuppressive drugs, initial encounter: Secondary | ICD-10-CM

## 2022-11-23 DIAGNOSIS — Z1211 Encounter for screening for malignant neoplasm of colon: Secondary | ICD-10-CM

## 2022-11-23 DIAGNOSIS — F19982 Other psychoactive substance use, unspecified with psychoactive substance-induced sleep disorder: Secondary | ICD-10-CM

## 2022-11-23 DIAGNOSIS — M545 Low back pain, unspecified: Secondary | ICD-10-CM

## 2022-11-23 DIAGNOSIS — Z8542 Personal history of malignant neoplasm of other parts of uterus: Secondary | ICD-10-CM

## 2022-11-23 LAB — CBC WITH DIFFERENTIAL/PLATELET
Basophils Absolute: 0 10*3/uL (ref 0.0–0.2)
Basos: 0 %
EOS (ABSOLUTE): 0.1 10*3/uL (ref 0.0–0.4)
Eos: 2 %
Hematocrit: 43.4 % (ref 34.0–46.6)
Hemoglobin: 14.6 g/dL (ref 11.1–15.9)
Immature Grans (Abs): 0 10*3/uL (ref 0.0–0.1)
Immature Granulocytes: 0 %
Lymphocytes Absolute: 1.9 10*3/uL (ref 0.7–3.1)
Lymphs: 36 %
MCH: 30 pg (ref 26.6–33.0)
MCHC: 33.6 g/dL (ref 31.5–35.7)
MCV: 89 fL (ref 79–97)
Monocytes Absolute: 0.3 10*3/uL (ref 0.1–0.9)
Monocytes: 5 %
Neutrophils Absolute: 3 10*3/uL (ref 1.4–7.0)
Neutrophils: 57 %
Platelets: 247 10*3/uL (ref 150–450)
RBC: 4.86 x10E6/uL (ref 3.77–5.28)
RDW: 11.8 % (ref 11.7–15.4)
WBC: 5.4 10*3/uL (ref 3.4–10.8)

## 2022-11-23 LAB — CMP14+EGFR
ALT: 13 IU/L (ref 0–32)
AST: 12 IU/L (ref 0–40)
Albumin/Globulin Ratio: 2 (ref 1.2–2.2)
Albumin: 4.3 g/dL (ref 3.8–4.9)
Alkaline Phosphatase: 83 IU/L (ref 44–121)
BUN/Creatinine Ratio: 23 (ref 9–23)
BUN: 18 mg/dL (ref 6–24)
Bilirubin Total: 0.6 mg/dL (ref 0.0–1.2)
CO2: 25 mmol/L (ref 20–29)
Calcium: 9.9 mg/dL (ref 8.7–10.2)
Chloride: 103 mmol/L (ref 96–106)
Creatinine, Ser: 0.8 mg/dL (ref 0.57–1.00)
Globulin, Total: 2.2 g/dL (ref 1.5–4.5)
Glucose: 140 mg/dL — ABNORMAL HIGH (ref 70–99)
Potassium: 4.6 mmol/L (ref 3.5–5.2)
Sodium: 141 mmol/L (ref 134–144)
Total Protein: 6.5 g/dL (ref 6.0–8.5)
eGFR: 86 mL/min/{1.73_m2} (ref >59)

## 2022-11-23 LAB — BAYER DCA HB A1C WAIVED: HB A1C (BAYER DCA - WAIVED): 6.7 % — ABNORMAL HIGH (ref 4.8–5.6)

## 2022-11-23 NOTE — Progress Notes (Signed)
Subjective:    Patient ID: [name], female    DOB: Aug 28, 1965, 57 y.o.   MRN: [id number]  Chief Complaint  Patient presents with   Medical Management of Chronic Issues   PT presents to the office today for chronic follow up.    She has hx endometrial cancer in 2018. She is still followed by Oncologists  annually. She has peripheral neuropathy from the chemo.  Hypertension This is a chronic problem. The current episode started more than 1 year ago. The problem has been resolved since onset. The problem is controlled. Associated symptoms include anxiety. Pertinent negatives include no blurred vision, malaise/fatigue, peripheral edema or shortness of breath. Risk factors for coronary artery disease include dyslipidemia and smoking/tobacco exposure. The current treatment provides moderate improvement.  Diabetes She presents for her follow-up diabetic visit. She has type 2 diabetes mellitus. Hypoglycemia symptoms include nervousness/anxiousness. Pertinent negatives for diabetes include no blurred vision and no foot paresthesias. Symptoms are stable. Risk factors for coronary artery disease include dyslipidemia, diabetes mellitus, hypertension and sedentary lifestyle. She is following a generally healthy diet. Her overall blood glucose range is 110-130 mg/dl. Eye exam is not current.  Nicotine Dependence Presents for follow-up visit. Symptoms include insomnia. Her urge triggers include company of smokers. The symptoms have been stable. She smokes < 1/2 a pack of cigarettes per day.  Insomnia Primary symptoms: difficulty falling asleep, frequent awakening, no malaise/fatigue.   The current episode started more than one year. The problem occurs intermittently. Past treatments include medication. The treatment provided moderate relief.  Anxiety Presents for follow-up visit. Symptoms include excessive worry, insomnia, nervous/anxious behavior and restlessness. Patient reports no shortness  of breath. Symptoms occur most days. The severity of symptoms is moderate.    Back Pain This is a chronic problem. The current episode started more than 1 year ago. The problem occurs intermittently. The problem has been waxing and waning since onset. The pain is present in the lumbar spine. Pain scale: no pain today, but after work can be 8-9. The patient is experiencing no pain.      Review of Systems  Constitutional:  Negative for malaise/fatigue.  Eyes:  Negative for blurred vision.  Respiratory:  Negative for shortness of breath.   Musculoskeletal:  Positive for back pain.  Psychiatric/Behavioral:  The patient is nervous/anxious and has insomnia.   All other systems reviewed and are negative.      Objective:   Physical Exam Vitals reviewed.  Constitutional:      General: She is not in acute distress.    Appearance: She is well-developed.  HENT:     Head: Normocephalic and atraumatic.     Right Ear: Tympanic membrane normal.     Left Ear: Tympanic membrane normal.  Eyes:     Pupils: Pupils are equal, round, and reactive to light.  Neck:     Thyroid: No thyromegaly.  Cardiovascular:     Rate and Rhythm: Normal rate and regular rhythm.     Heart sounds: Normal heart sounds. No murmur heard. Pulmonary:     Effort: Pulmonary effort is normal. No respiratory distress.     Breath sounds: Normal breath sounds. No wheezing.  Abdominal:     General: Bowel sounds are normal. There is no distension.     Palpations: Abdomen is soft.     Tenderness: There is no abdominal tenderness.  Musculoskeletal:        General: No tenderness. Normal range of motion.  Cervical back: Normal range of motion and neck supple.  Skin:    General: Skin is warm and dry.  Neurological:     Mental Status: She is alert and oriented to person, place, and time.     Cranial Nerves: No cranial nerve deficit.     Deep Tendon Reflexes: Reflexes are normal and symmetric.  Psychiatric:         Behavior: Behavior normal.        Thought Content: Thought content normal.        Judgment: Judgment normal.       BP 125/78   Pulse 73   Temp (!) 97.1 F (36.2 C) (Temporal)   Ht 5\' 8"  (1.727 m)   Wt 146 lb 3.2 oz (66.3 kg)   LMP 07/25/2014 Comment: perimenopausal  SpO2 100%   BMI 22.23 kg/m      Assessment & Plan:   [name] comes in today with chief complaint of Medical Management of Chronic Issues   Diagnosis and orders addressed:  1. GAD (generalized anxiety disorder) - CMP14+EGFR - CBC with Differential/Platelet  2. Chronic bilateral low back pain without sciatica - CMP14+EGFR - CBC with Differential/Platelet  3. Tobacco user - CMP14+EGFR - CBC with Differential/Platelet  4. Vitamin D deficiency - CMP14+EGFR - CBC with Differential/Platelet  5. Insomnia due to drug - CMP14+EGFR - CBC with Differential/Platelet  6. Hypertension associated with diabetes - CMP14+EGFR - CBC with Differential/Platelet  7. Type 2 diabetes mellitus with other specified complication, without long-term current use of insulin - CMP14+EGFR - CBC with Differential/Platelet - Bayer DCA Hb A1c Waived  8. History of endometrial cancer - CMP14+EGFR - CBC with Differential/Platelet  9. Peripheral neuropathy due to chemotherapy - CMP14+EGFR - CBC with Differential/Platelet  10. Colon cancer screening - Ambulatory referral to Gastroenterology   Labs pending Health Maintenance reviewed Diet and exercise encouraged  Follow up plan: 6 months    Jannifer Rodney, FNP

## 2022-11-23 NOTE — Patient Instructions (Signed)

## 2022-11-24 ENCOUNTER — Encounter (INDEPENDENT_AMBULATORY_CARE_PROVIDER_SITE_OTHER): Payer: Self-pay | Admitting: *Deleted

## 2022-12-10 ENCOUNTER — Telehealth: Payer: Self-pay | Admitting: Family

## 2023-01-03 DIAGNOSIS — Z1272 Encounter for screening for malignant neoplasm of vagina: Secondary | ICD-10-CM | POA: Diagnosis not present

## 2023-01-03 DIAGNOSIS — Z6822 Body mass index (BMI) 22.0-22.9, adult: Secondary | ICD-10-CM | POA: Diagnosis not present

## 2023-01-03 DIAGNOSIS — Z01419 Encounter for gynecological examination (general) (routine) without abnormal findings: Secondary | ICD-10-CM | POA: Diagnosis not present

## 2023-01-04 ENCOUNTER — Other Ambulatory Visit: Payer: Self-pay | Admitting: Obstetrics and Gynecology

## 2023-01-04 DIAGNOSIS — Z720 Tobacco use: Secondary | ICD-10-CM

## 2023-03-30 ENCOUNTER — Ambulatory Visit: Payer: BC Managed Care – PPO | Admitting: Nurse Practitioner

## 2023-03-30 ENCOUNTER — Encounter: Payer: Self-pay | Admitting: Family Medicine

## 2023-03-30 ENCOUNTER — Ambulatory Visit: Payer: BC Managed Care – PPO | Admitting: Family Medicine

## 2023-03-30 VITALS — BP 115/69 | HR 91 | Temp 97.3°F | Ht 68.0 in | Wt 143.4 lb

## 2023-03-30 DIAGNOSIS — R051 Acute cough: Secondary | ICD-10-CM

## 2023-03-30 DIAGNOSIS — R0981 Nasal congestion: Secondary | ICD-10-CM

## 2023-03-30 LAB — VERITOR FLU A/B WAIVED
Influenza A: NEGATIVE
Influenza B: NEGATIVE

## 2023-03-30 NOTE — Patient Instructions (Signed)
It appears that you have a viral upper respiratory infection (cold).  Cold symptoms can last up to 2 weeks.   ? ?- Get plenty of rest and drink plenty of fluids. ?- Try to breathe moist air. Use a cold mist humidifier. ?- Consume warm fluids (soup or tea) to provide relief for a stuffy nose and to loosen phlegm. ?- For nasal stuffiness, try saline nasal spray or a Neti Pot. Afrin nasal spray can also be used but this product should not be used longer than 3 days or it will cause rebound nasal stuffiness (worsening nasal congestion). ?- For sore throat pain relief: use chloraseptic spray, suck on throat lozenges, hard candy or popsicles; gargle with warm salt water (1/4 tsp. salt per 8 oz. of water); and eat soft, bland foods. ?- Eat a well-balanced diet. If you cannot, ensure you are getting enough nutrients by taking a daily multivitamin. ?- Avoid dairy products, as they can thicken phlegm. ?- Avoid alcohol, as it impairs your body?s immune system. ? ?CONTACT YOUR DOCTOR IF YOU EXPERIENCE ANY OF THE FOLLOWING: ?- High fever ?- Ear pain ?- Sinus-type headache ?- Unusually severe cold symptoms ?- Cough that gets worse while other cold symptoms improve ?- Flare up of any chronic lung problem, such as asthma ?- Your symptoms persist longer than 2 weeks ? ? ?

## 2023-03-30 NOTE — Progress Notes (Signed)
Acute Office Visit  Subjective:  Patient ID: [name], female    DOB: 09-Mar-1966, 57 y.o.   MRN: [id number]  Chief Complaint  Patient presents with   Nasal Congestion    Symptoms started yesterday. States she thinks she had a fever yesterday   Generalized Body Aches    Symptoms started yesterday.    Cough   Cough  Patient is in today for URI symptoms. States that symptoms started yesterday morning and she slept all day. Reports subjective fever with chills, alternating to feeling hot and sweating.  Reports loss of appetite. Took mucinex sinus which helped slightly. Reports that she took a home covid test yesterday and it was negative.  Endorses cough, mild rhinorrhea, pressure of sinuses, headache and ear pain. Needs a not for work.   Review of Systems  Respiratory:  Positive for cough.    As per HPI  Objective:  BP 115/69   Pulse 91   Temp (!) 97.3 F (36.3 C) (Temporal)   Ht 5\' 8"  (1.727 m)   Wt 143 lb 6.4 oz (65 kg)   LMP 07/25/2014 Comment: perimenopausal  SpO2 97%   BMI 21.80 kg/m   Physical Exam Constitutional:      General: She is awake. She is not in acute distress.    Appearance: Normal appearance. She is well-developed, well-groomed and normal weight. She is not ill-appearing, toxic-appearing or diaphoretic.  HENT:     Right Ear: No laceration, drainage, swelling or tenderness. No middle ear effusion. There is no impacted cerumen. No foreign body. No mastoid tenderness. No PE tube. No hemotympanum. Tympanic membrane is injected. Tympanic membrane is not scarred, perforated, erythematous, retracted or bulging.     Left Ear: No laceration, drainage, swelling or tenderness.  No middle ear effusion. There is no impacted cerumen. No foreign body. No mastoid tenderness. No PE tube. No hemotympanum. Tympanic membrane is injected. Tympanic membrane is not scarred, perforated, erythematous, retracted or bulging.     Nose: Congestion and rhinorrhea present.  Rhinorrhea is clear.     Right Sinus: Maxillary sinus tenderness and frontal sinus tenderness present.     Left Sinus: Maxillary sinus tenderness and frontal sinus tenderness present.     Mouth/Throat:     Lips: Pink.     Mouth: Mucous membranes are moist.     Tongue: No lesions.     Palate: No mass.     Pharynx: Posterior oropharyngeal erythema and postnasal drip present. No pharyngeal swelling, oropharyngeal exudate or uvula swelling.     Tonsils: No tonsillar exudate or tonsillar abscesses.  Cardiovascular:     Rate and Rhythm: Normal rate and regular rhythm.     Pulses: Normal pulses.          Radial pulses are 2+ on the right side and 2+ on the left side.       Posterior tibial pulses are 2+ on the right side and 2+ on the left side.     Heart sounds: Normal heart sounds. No murmur heard.    No gallop.  Pulmonary:     Effort: Pulmonary effort is normal. No respiratory distress.     Breath sounds: Normal breath sounds. No stridor. No wheezing, rhonchi or rales.  Musculoskeletal:     Cervical back: Full passive range of motion without pain and neck supple.     Right lower leg: No edema.     Left lower leg: No edema.  Skin:    General: Skin is  warm.     Capillary Refill: Capillary refill takes less than 2 seconds.  Neurological:     General: No focal deficit present.     Mental Status: She is alert, oriented to person, place, and time and easily aroused. Mental status is at baseline.     GCS: GCS eye subscore is 4. GCS verbal subscore is 5. GCS motor subscore is 6.     Motor: No weakness.  Psychiatric:        Attention and Perception: Attention and perception normal.        Mood and Affect: Mood and affect normal.        Speech: Speech normal.        Behavior: Behavior normal. Behavior is cooperative.        Thought Content: Thought content normal. Thought content does not include homicidal or suicidal ideation. Thought content does not include homicidal or suicidal plan.         Cognition and Memory: Cognition and memory normal.        Judgment: Judgment normal.        03/30/2023   11:36 AM 11/23/2022    9:41 AM 07/19/2022   10:29 AM  Depression screen PHQ 2/9  Decreased Interest 0 0 0  Down, Depressed, Hopeless 0 0 0  PHQ - 2 Score 0 0 0  Altered sleeping 0 0 0  Tired, decreased energy 0 0 0  Change in appetite 0 0 0  Feeling bad or failure about yourself  0 0 0  Trouble concentrating 0 0 0  Moving slowly or fidgety/restless 0 0 0  Suicidal thoughts 0 0 0  PHQ-9 Score 0 0 0  Difficult doing work/chores Not difficult at all Not difficult at all Not difficult at all      03/30/2023   11:36 AM 11/23/2022   10:23 AM 12/21/2021    8:58 AM  GAD 7 : Generalized Anxiety Score  Nervous, Anxious, on Edge 0 0 0  Control/stop worrying 0 0 0  Worry too much - different things 0 0 0  Trouble relaxing 0 0 0  Restless 0 0 0  Easily annoyed or irritable 0 0 0  Afraid - awful might happen 0 0 0  Total GAD 7 Score 0 0 0  Anxiety Difficulty Not difficult at all Not difficult at all Not difficult at all    Assessment & Plan:  1. Nasal congestion Labs as below. Will communicate results to patient once available. Will await results to determine next steps.  Rapid flu negative in office. Discussed with patient in office. Discussed at home care and return precautions.  - Veritor Flu A/B Waived - Novel Coronavirus, NAA ([facility])  2. Acute cough As above.  - Veritor Flu A/B Waived - Novel Coronavirus, NAA ([facility])  The above assessment and management plan was discussed with the patient. The patient verbalized understanding of and has agreed to the management plan using shared-decision making. Patient is aware to call the clinic if they develop any new symptoms or if symptoms fail to improve or worsen. Patient is aware when to return to the clinic for a follow-up visit. Patient educated on when it is appropriate to go to the emergency department.   Return if symptoms  worsen or fail to improve.  Neale Burly, DNP-FNP [facility] [address] [place], [place] [address] [phone]

## 2023-03-31 LAB — NOVEL CORONAVIRUS, NAA: SARS-CoV-2, NAA: DETECTED — AB

## 2023-04-01 ENCOUNTER — Other Ambulatory Visit: Payer: Self-pay | Admitting: Family Medicine

## 2023-04-01 DIAGNOSIS — U071 COVID-19: Secondary | ICD-10-CM

## 2023-04-01 MED ORDER — NIRMATRELVIR/RITONAVIR (PAXLOVID)TABLET
3.0000 | ORAL_TABLET | Freq: Two times a day (BID) | ORAL | 0 refills | Status: AC
Start: 2023-04-01 — End: 2023-04-06

## 2023-04-01 NOTE — Progress Notes (Signed)
Patient positive for covid. She is still within the treatment window, if she is interested in taking one of the antivirals. Recommend supportive treatment such as OTC cough and cold medicine, Mucinex, increasing hydration, cool mist humidifier, and changing out toothbrush at the end of the illness. Per CDC recommendations, patient should isolate until symptom free for 24 hours, then extra precautions for 5 days to reduce the spread.

## 2023-04-01 NOTE — Progress Notes (Signed)
Antiviral sent in to [facility]

## 2023-04-12 DIAGNOSIS — Z1231 Encounter for screening mammogram for malignant neoplasm of breast: Secondary | ICD-10-CM | POA: Diagnosis not present

## 2023-04-12 DIAGNOSIS — R92323 Mammographic fibroglandular density, bilateral breasts: Secondary | ICD-10-CM | POA: Diagnosis not present

## 2023-04-12 LAB — HM MAMMOGRAPHY

## 2023-04-15 ENCOUNTER — Encounter: Payer: Self-pay | Admitting: Family

## 2023-04-26 DIAGNOSIS — R87610 Atypical squamous cells of undetermined significance on cytologic smear of cervix (ASC-US): Secondary | ICD-10-CM | POA: Diagnosis not present

## 2023-04-26 DIAGNOSIS — R87612 Low grade squamous intraepithelial lesion on cytologic smear of cervix (LGSIL): Secondary | ICD-10-CM | POA: Diagnosis not present

## 2023-04-27 ENCOUNTER — Telehealth: Payer: Self-pay | Admitting: Family

## 2023-05-27 ENCOUNTER — Encounter: Payer: BC Managed Care – PPO | Admitting: Family

## 2023-05-27 ENCOUNTER — Encounter (INDEPENDENT_AMBULATORY_CARE_PROVIDER_SITE_OTHER): Payer: Self-pay | Admitting: *Deleted

## 2023-06-13 DIAGNOSIS — Z23 Encounter for immunization: Secondary | ICD-10-CM | POA: Diagnosis not present

## 2023-06-20 ENCOUNTER — Ambulatory Visit: Payer: BC Managed Care – PPO

## 2023-06-24 ENCOUNTER — Telehealth: Payer: Self-pay | Admitting: Family

## 2023-06-24 ENCOUNTER — Telehealth: Payer: Self-pay

## 2023-06-24 NOTE — Telephone Encounter (Signed)
Copied from CRM [id number]. Topic: General - Other >> Jun 24, 2023  1:35 PM Almira Coaster wrote: Reason for CRM: Patient called to advise that she has not completed her eye exam this year, but will call to schedule on.

## 2023-07-27 DIAGNOSIS — Z13820 Encounter for screening for osteoporosis: Secondary | ICD-10-CM | POA: Diagnosis not present

## 2023-07-27 DIAGNOSIS — Z8542 Personal history of malignant neoplasm of other parts of uterus: Secondary | ICD-10-CM | POA: Diagnosis not present

## 2023-07-27 DIAGNOSIS — N870 Mild cervical dysplasia: Secondary | ICD-10-CM | POA: Diagnosis not present

## 2023-07-27 DIAGNOSIS — Z1272 Encounter for screening for malignant neoplasm of vagina: Secondary | ICD-10-CM | POA: Diagnosis not present

## 2023-07-27 DIAGNOSIS — Z1151 Encounter for screening for human papillomavirus (HPV): Secondary | ICD-10-CM | POA: Diagnosis not present

## 2023-07-28 ENCOUNTER — Ambulatory Visit: Payer: BC Managed Care – PPO

## 2023-08-12 ENCOUNTER — Other Ambulatory Visit: Payer: Self-pay | Admitting: Family

## 2023-08-12 DIAGNOSIS — F411 Generalized anxiety disorder: Secondary | ICD-10-CM

## 2023-08-15 ENCOUNTER — Other Ambulatory Visit: Payer: BC Managed Care – PPO

## 2023-08-29 ENCOUNTER — Other Ambulatory Visit: Payer: BC Managed Care – PPO

## 2023-09-14 ENCOUNTER — Other Ambulatory Visit: Payer: Self-pay | Admitting: Family

## 2023-09-14 DIAGNOSIS — F411 Generalized anxiety disorder: Secondary | ICD-10-CM

## 2023-09-26 ENCOUNTER — Ambulatory Visit: Payer: BC Managed Care – PPO | Admitting: Family

## 2023-09-26 ENCOUNTER — Encounter: Payer: Self-pay | Admitting: Family

## 2023-09-26 VITALS — BP 116/74 | HR 63 | Temp 97.5°F | Wt 147.4 lb

## 2023-09-26 DIAGNOSIS — E1169 Type 2 diabetes mellitus with other specified complication: Secondary | ICD-10-CM

## 2023-09-26 DIAGNOSIS — T451X5A Adverse effect of antineoplastic and immunosuppressive drugs, initial encounter: Secondary | ICD-10-CM

## 2023-09-26 DIAGNOSIS — Z23 Encounter for immunization: Secondary | ICD-10-CM | POA: Diagnosis not present

## 2023-09-26 DIAGNOSIS — Z8542 Personal history of malignant neoplasm of other parts of uterus: Secondary | ICD-10-CM | POA: Diagnosis not present

## 2023-09-26 DIAGNOSIS — I152 Hypertension secondary to endocrine disorders: Secondary | ICD-10-CM

## 2023-09-26 DIAGNOSIS — M545 Low back pain, unspecified: Secondary | ICD-10-CM

## 2023-09-26 DIAGNOSIS — Z0001 Encounter for general adult medical examination with abnormal findings: Secondary | ICD-10-CM

## 2023-09-26 DIAGNOSIS — G8929 Other chronic pain: Secondary | ICD-10-CM

## 2023-09-26 DIAGNOSIS — E1159 Type 2 diabetes mellitus with other circulatory complications: Secondary | ICD-10-CM

## 2023-09-26 DIAGNOSIS — Z0000 Encounter for general adult medical examination without abnormal findings: Secondary | ICD-10-CM | POA: Diagnosis not present

## 2023-09-26 DIAGNOSIS — Z720 Tobacco use: Secondary | ICD-10-CM

## 2023-09-26 DIAGNOSIS — Z1211 Encounter for screening for malignant neoplasm of colon: Secondary | ICD-10-CM

## 2023-09-26 DIAGNOSIS — F411 Generalized anxiety disorder: Secondary | ICD-10-CM

## 2023-09-26 DIAGNOSIS — G620 Drug-induced polyneuropathy: Secondary | ICD-10-CM

## 2023-09-26 DIAGNOSIS — E559 Vitamin D deficiency, unspecified: Secondary | ICD-10-CM

## 2023-09-26 LAB — BAYER DCA HB A1C WAIVED: HB A1C (BAYER DCA - WAIVED): 6.6 % — ABNORMAL HIGH (ref 4.8–5.6)

## 2023-09-26 MED ORDER — LISINOPRIL 2.5 MG PO TABS
2.5000 mg | ORAL_TABLET | Freq: Every day | ORAL | 3 refills | Status: AC
Start: 2023-09-26 — End: ?

## 2023-09-26 MED ORDER — ESCITALOPRAM OXALATE 10 MG PO TABS: 10.0000 mg | ORAL_TABLET | Freq: Every day | ORAL | 3 refills | Status: AC

## 2023-09-26 MED ORDER — CYCLOBENZAPRINE HCL 10 MG PO TABS: 10.0000 mg | ORAL_TABLET | ORAL | 3 refills | Status: AC | PRN

## 2023-09-26 NOTE — Progress Notes (Signed)
 Subjective:    Patient ID: [name], female    DOB: Aug 05, 1966, 58 y.o.   MRN: [id number]  Chief Complaint  Patient presents with   Follow-up   PT presents to the office today for CPE and chronic follow up.    She has hx endometrial cancer in 2018. She is still followed by Gynecologists annually. She has peripheral neuropathy from the chemo.  Hypertension This is a chronic problem. The current episode started more than 1 year ago. The problem has been resolved since onset. The problem is controlled. Pertinent negatives include no peripheral edema or shortness of breath. Risk factors for coronary artery disease include dyslipidemia, smoking/tobacco exposure and diabetes mellitus. The current treatment provides moderate improvement.  Diabetes She presents for her follow-up diabetic visit. She has type 2 diabetes mellitus. Hypoglycemia symptoms include nervousness/anxiousness. Symptoms are stable. Risk factors for coronary artery disease include dyslipidemia, diabetes mellitus, hypertension and sedentary lifestyle. She is following a generally healthy diet. Her overall blood glucose range is 110-130 mg/dl. Eye exam is not current.  Nicotine  Dependence Presents for follow-up visit. The symptoms have been stable. She smokes < 1/2 a pack of cigarettes per day.  Anxiety Presents for follow-up visit. Symptoms include excessive worry, nervous/anxious behavior and restlessness. Patient reports no shortness of breath. Symptoms occur most days. The severity of symptoms is moderate.    Back Pain This is a chronic problem. The current episode started more than 1 year ago. The problem occurs intermittently. The problem has been waxing and waning since onset. The pain is present in the lumbar spine. Pain scale: no pain today, but after work can be 8-9. The patient is experiencing no pain. The treatment provided moderate relief.      Review of Systems  Respiratory:  Negative for shortness of  breath.   Psychiatric/Behavioral:  The patient is nervous/anxious.   All other systems reviewed and are negative.  Family History  Problem Relation Age of Onset   Hypertension Mother    Heart disease Father    Cancer Father        lung ca   Arthritis Sister        rheumatoid   Cancer Brother        prostate   Social History   Socioeconomic History   Marital status: Married    Spouse name: Polly Brink   Number of children: 0   Years of education: Not on file   Highest education level: Not on file  Occupational History   Occupation: Engineer, drilling  Tobacco Use   Smoking status: Every Day    Current packs/day: 0.50    Average packs/day: 0.5 packs/day for 34.0 years (17.0 ttl pk-yrs)    Types: Cigarettes   Smokeless tobacco: Never  Vaping Use   Vaping status: Never Used  Substance and Sexual Activity   Alcohol use: No   Drug use: No   Sexual activity: Yes  Other Topics Concern   Not on file  Social History Narrative   Not on file   Social Drivers of Health   Financial Resource Strain: Not on file  Food Insecurity: Not on file  Transportation Needs: Not on file  Physical Activity: Not on file  Stress: Not on file  Social Connections: Unknown (12/27/2021)   [facility]   Social Network    Social Network: Not on file        Objective:   Physical Exam Vitals reviewed.  Constitutional:  General: She is not in acute distress.    Appearance: She is well-developed.  HENT:     Head: Normocephalic and atraumatic.     Right Ear: Tympanic membrane normal.     Left Ear: Tympanic membrane normal.  Eyes:     Pupils: Pupils are equal, round, and reactive to light.  Neck:     Thyroid : No thyromegaly.  Cardiovascular:     Rate and Rhythm: Regular rhythm.     Heart sounds: Normal heart sounds. No murmur heard. Pulmonary:     Effort: Pulmonary effort is normal. No respiratory distress.     Breath sounds: Normal breath sounds. No  wheezing.  Abdominal:     General: Bowel sounds are normal. There is no distension.     Palpations: Abdomen is soft.     Tenderness: There is no abdominal tenderness.  Musculoskeletal:        General: No tenderness. Normal range of motion.     Cervical back: Normal range of motion and neck supple.  Skin:    General: Skin is warm and dry.  Neurological:     Mental Status: She is alert and oriented to person, place, and time.     Cranial Nerves: No cranial nerve deficit.     Deep Tendon Reflexes: Reflexes are normal and symmetric.  Psychiatric:        Behavior: Behavior normal.        Thought Content: Thought content normal.        Judgment: Judgment normal.       BP 116/74   Pulse 63   Temp (!) 97.5 F (36.4 C)   Wt 147 lb 6.4 oz (66.9 kg)   LMP 07/25/2014 Comment: perimenopausal  SpO2 100%   BMI 22.41 kg/m      Assessment & Plan:   [name] comes in today with chief complaint of Follow-up   Diagnosis and orders addressed:  1. Chronic bilateral low back pain without sciatica - CBC with Differential/Platelet - CMP14+EGFR - cyclobenzaprine  (FLEXERIL ) 10 MG tablet; Take 1 tablet (10 mg total) by mouth as needed for muscle spasms.  Dispense: 90 tablet; Refill: 3  2. GAD (generalized anxiety disorder) - CBC with Differential/Platelet - CMP14+EGFR - escitalopram  (LEXAPRO ) 10 MG tablet; Take 1 tablet (10 mg total) by mouth daily.  Dispense: 90 tablet; Refill: 3  3. Type 2 diabetes mellitus with other specified complication, without long-term current use of insulin  (HCC) - Bayer DCA Hb A1c Waived - CBC with Differential/Platelet - CMP14+EGFR - Microalbumin / creatinine urine ratio - lisinopril  (ZESTRIL ) 2.5 MG tablet; Take 1 tablet (2.5 mg total) by mouth daily.  Dispense: 90 tablet; Refill: 3  4. Annual physical exam (Primary) - Bayer DCA Hb A1c Waived - CBC with Differential/Platelet - CMP14+EGFR - Lipid panel - Microalbumin / creatinine urine  ratio  5. History of endometrial cancer - CBC with Differential/Platelet - CMP14+EGFR  6. Hypertension associated with diabetes (HCC) - CBC with Differential/Platelet - CMP14+EGFR  7. Peripheral neuropathy due to chemotherapy (HCC) - CBC with Differential/Platelet - CMP14+EGFR  8. Tobacco user - CBC with Differential/Platelet - CMP14+EGFR  9. Vitamin D  deficiency - CBC with Differential/Platelet - CMP14+EGFR  10. Colon cancer screening - Ambulatory referral to Gastroenterology  Labs pending Continue current medications  Keep follow up with specialists  Health Maintenance reviewed Diet and exercise encouraged  Follow up plan: 6 months    Tommas Fragmin, FNP

## 2023-09-26 NOTE — Patient Instructions (Signed)

## 2023-09-27 ENCOUNTER — Other Ambulatory Visit: Payer: Self-pay | Admitting: Family

## 2023-09-27 LAB — LIPID PANEL
Chol/HDL Ratio: 4.4 {ratio} (ref 0.0–4.4)
Cholesterol, Total: 162 mg/dL (ref 100–199)
HDL: 37 mg/dL — ABNORMAL LOW (ref >39)
LDL Chol Calc (NIH): 106 mg/dL — ABNORMAL HIGH (ref 0–99)
Triglycerides: 101 mg/dL (ref 0–149)
VLDL Cholesterol Cal: 19 mg/dL (ref 5–40)

## 2023-09-27 LAB — CBC WITH DIFFERENTIAL/PLATELET
Basophils Absolute: 0 10*3/uL (ref 0.0–0.2)
Basos: 1 %
EOS (ABSOLUTE): 0.1 10*3/uL (ref 0.0–0.4)
Eos: 2 %
Hematocrit: 43.5 % (ref 34.0–46.6)
Hemoglobin: 14.7 g/dL (ref 11.1–15.9)
Immature Grans (Abs): 0 10*3/uL (ref 0.0–0.1)
Immature Granulocytes: 0 %
Lymphocytes Absolute: 2.2 10*3/uL (ref 0.7–3.1)
Lymphs: 41 %
MCH: 30.4 pg (ref 26.6–33.0)
MCHC: 33.8 g/dL (ref 31.5–35.7)
MCV: 90 fL (ref 79–97)
Monocytes Absolute: 0.3 10*3/uL (ref 0.1–0.9)
Monocytes: 5 %
Neutrophils Absolute: 2.8 10*3/uL (ref 1.4–7.0)
Neutrophils: 51 %
Platelets: 252 10*3/uL (ref 150–450)
RBC: 4.83 x10E6/uL (ref 3.77–5.28)
RDW: 12.2 % (ref 11.7–15.4)
WBC: 5.5 10*3/uL (ref 3.4–10.8)

## 2023-09-27 LAB — CMP14+EGFR
ALT: 12 [IU]/L (ref 0–32)
AST: 13 [IU]/L (ref 0–40)
Albumin: 4.2 g/dL (ref 3.8–4.9)
Alkaline Phosphatase: 86 [IU]/L (ref 44–121)
BUN/Creatinine Ratio: 19 (ref 9–23)
BUN: 15 mg/dL (ref 6–24)
Bilirubin Total: 0.9 mg/dL (ref 0.0–1.2)
CO2: 22 mmol/L (ref 20–29)
Calcium: 9.2 mg/dL (ref 8.7–10.2)
Chloride: 104 mmol/L (ref 96–106)
Creatinine, Ser: 0.79 mg/dL (ref 0.57–1.00)
Globulin, Total: 2.1 g/dL (ref 1.5–4.5)
Glucose: 120 mg/dL — ABNORMAL HIGH (ref 70–99)
Potassium: 4.4 mmol/L (ref 3.5–5.2)
Sodium: 141 mmol/L (ref 134–144)
Total Protein: 6.3 g/dL (ref 6.0–8.5)
eGFR: 87 mL/min/{1.73_m2} (ref >59)

## 2023-09-27 LAB — MICROALBUMIN / CREATININE URINE RATIO
Creatinine, Urine: 29.5 mg/dL
Microalb/Creat Ratio: <10 mg/g{creat} (ref 0–29)
Microalbumin, Urine: <3 ug/mL

## 2023-09-27 MED ORDER — PRAVASTATIN SODIUM 20 MG PO TABS: 20.0000 mg | ORAL_TABLET | Freq: Every evening | ORAL | 1 refills | Status: AC

## 2023-09-28 ENCOUNTER — Encounter: Payer: Self-pay | Admitting: Family Medicine

## 2024-01-03 ENCOUNTER — Telehealth: Payer: Self-pay | Admitting: Family

## 2024-01-11 DIAGNOSIS — Z1151 Encounter for screening for human papillomavirus (HPV): Secondary | ICD-10-CM | POA: Diagnosis not present

## 2024-01-11 DIAGNOSIS — Z6822 Body mass index (BMI) 22.0-22.9, adult: Secondary | ICD-10-CM | POA: Diagnosis not present

## 2024-01-11 DIAGNOSIS — Z01419 Encounter for gynecological examination (general) (routine) without abnormal findings: Secondary | ICD-10-CM | POA: Diagnosis not present

## 2024-01-12 ENCOUNTER — Other Ambulatory Visit: Payer: Self-pay | Admitting: Obstetrics and Gynecology

## 2024-01-12 DIAGNOSIS — Z720 Tobacco use: Secondary | ICD-10-CM

## 2024-01-18 ENCOUNTER — Other Ambulatory Visit

## 2024-03-27 ENCOUNTER — Ambulatory Visit: Payer: BC Managed Care – PPO | Admitting: Family

## 2024-04-30 ENCOUNTER — Encounter: Payer: Self-pay | Admitting: Family Medicine

## 2024-04-30 ENCOUNTER — Ambulatory Visit (INDEPENDENT_AMBULATORY_CARE_PROVIDER_SITE_OTHER): Payer: Self-pay | Admitting: Family Medicine

## 2024-04-30 VITALS — BP 124/77 | HR 97 | Temp 98.5°F | Ht 68.0 in | Wt 144.0 lb

## 2024-04-30 DIAGNOSIS — R051 Acute cough: Secondary | ICD-10-CM

## 2024-04-30 DIAGNOSIS — J40 Bronchitis, not specified as acute or chronic: Secondary | ICD-10-CM

## 2024-04-30 DIAGNOSIS — J329 Chronic sinusitis, unspecified: Secondary | ICD-10-CM

## 2024-04-30 DIAGNOSIS — R0981 Nasal congestion: Secondary | ICD-10-CM

## 2024-04-30 MED ORDER — ALBUTEROL SULFATE HFA 108 (90 BASE) MCG/ACT IN AERS: 2.0000 | INHALATION_SPRAY | Freq: Four times a day (QID) | RESPIRATORY_TRACT | 0 refills | Status: AC | PRN

## 2024-04-30 MED ORDER — CEFDINIR 300 MG PO CAPS: 300.0000 mg | ORAL_CAPSULE | Freq: Two times a day (BID) | ORAL | 0 refills | Status: DC

## 2024-04-30 NOTE — Progress Notes (Signed)
 BP 124/77   Pulse 97   Temp 98.5 F (36.9 C)   Ht 5' 8 (1.727 m)   Wt 144 lb (65.3 kg)   LMP 07/25/2014 Comment: perimenopausal  SpO2 96%   BMI 21.90 kg/m    Subjective:   Patient ID: [name], female    DOB: 01-Jul-1966, 58 y.o.   MRN: [id number]  HPI: [name] is a 58 y.o. female presenting on 04/30/2024 for URI   Discussed the use of AI scribe software for clinical note transcription with the patient, who gave verbal consent to proceed.  History of Present Illness   [name] is a 58 year old female who presents with sore throat, cough, and nasal congestion.  Symptoms began on Saturday after work with an irritated throat, cough, and nasal congestion. She took liquid Mucinex  for congestion, but later that night, she experienced nausea and vomiting, along with increased congestion and drainage.  She describes having a fever and chills, noting that she felt cold when returning to bed. She did not measure her temperature but believes the fever persisted until the previous day. She slept most of the day on Sunday, waking every four hours to take Tylenol  for symptom relief.  She continues to feel congested, particularly in her head, and reports that the cough has improved slightly. She experienced significant wheezing on Saturday night and Sunday morning, but it has since lessened.  She has a history of smoking for many years and has previously been diagnosed with bronchitis, for which she was prescribed inhalers, although it has been years since she last used one.  She performed a home COVID test, which was negative, and was swabbed for flu during the visit.  Her mother is currently hospitalized with pneumonia, and she noted that her mother started with similar symptoms of nasal drainage.          Relevant past medical, surgical, family and social history reviewed and updated as indicated. Interim medical history since our last visit  reviewed. Allergies and medications reviewed and updated.  Review of Systems  Constitutional:  Negative for chills and fever.  Eyes:  Negative for visual disturbance.  Respiratory:  Negative for chest tightness and shortness of breath.   Cardiovascular:  Negative for chest pain and leg swelling.  Genitourinary:  Negative for difficulty urinating and dysuria.  Musculoskeletal:  Negative for back pain and gait problem.  Skin:  Negative for rash.  Neurological:  Negative for dizziness, light-headedness and headaches.  Psychiatric/Behavioral:  Negative for agitation and behavioral problems.   All other systems reviewed and are negative.   Per HPI unless specifically indicated above   Allergies as of 04/30/2024       Reactions   Amoxicillin -pot Clavulanate Other (See Comments)   Vomiting, broke out with bumps in the vaginal area.   Codeine  Hives   Rosuvastatin     Other reaction(s): muscle and joint pain        Medication List        Accurate as of April 30, 2024  4:12 PM. If you have any questions, ask your nurse or doctor.          albuterol  108 (90 Base) MCG/ACT inhaler Commonly known as: VENTOLIN  HFA Inhale 2 puffs into the lungs every 6 (six) hours as needed for wheezing or shortness of breath. Started by: Fonda LABOR Norene Oliveri   cefdinir  300 MG capsule Commonly known as: OMNICEF  Take 1 capsule (300 mg total) by mouth 2 (  two) times daily. 1 po BID Started by: Exzavier Ruderman A Makayla Confer   Cholecalciferol 50 MCG (2000 UT) Caps Take 1 capsule by mouth daily.   cyanocobalamin  1000 MCG tablet Commonly known as: VITAMIN B12 Take 1 tablet by mouth daily.   cyclobenzaprine  10 MG tablet Commonly known as: FLEXERIL  Take 1 tablet (10 mg total) by mouth as needed for muscle spasms.   escitalopram  10 MG tablet Commonly known as: LEXAPRO  Take 1 tablet (10 mg total) by mouth daily.   lisinopril  2.5 MG tablet Commonly known as: Zestril  Take 1 tablet (2.5 mg total) by mouth  daily.   Magnesium 400 MG Caps Take 1 tablet by mouth daily.   metFORMIN  500 MG tablet Commonly known as: GLUCOPHAGE  Take 500 mg by mouth 2 (two) times daily with a meal.   onetouch ultrasoft lancets Check blood sugars daily Dx E11.9   pravastatin  20 MG tablet Commonly known as: Pravachol  Take 1 tablet (20 mg total) by mouth every evening.         Objective:   BP 124/77   Pulse 97   Temp 98.5 F (36.9 C)   Ht 5' 8 (1.727 m)   Wt 144 lb (65.3 kg)   LMP 07/25/2014 Comment: perimenopausal  SpO2 96%   BMI 21.90 kg/m   Wt Readings from Last 3 Encounters:  04/30/24 144 lb (65.3 kg)  09/26/23 147 lb 6.4 oz (66.9 kg)  03/30/23 143 lb 6.4 oz (65 kg)    Physical Exam Physical Exam   HEENT: Throat erythematous, no pus. CHEST: Lungs with slight wheezing. CARDIOVASCULAR: Heart sounds clear.         Assessment & Plan:   Problem List Items Addressed This Visit   None Visit Diagnoses       Nasal congestion    -  Primary   Relevant Medications   cefdinir  (OMNICEF ) 300 MG capsule   albuterol  (VENTOLIN  HFA) 108 (90 Base) MCG/ACT inhaler   Other Relevant Orders   Veritor Flu A/B Waived     Acute cough       Relevant Medications   cefdinir  (OMNICEF ) 300 MG capsule   albuterol  (VENTOLIN  HFA) 108 (90 Base) MCG/ACT inhaler   Other Relevant Orders   Veritor Flu A/B Waived     Sinobronchitis       Relevant Medications   cefdinir  (OMNICEF ) 300 MG capsule   albuterol  (VENTOLIN  HFA) 108 (90 Base) MCG/ACT inhaler          Acute upper respiratory infection Symptoms suggest viral infection. Negative COVID test. Flu test pending. Mild wheezing likely due to smoking. - Await flu test results. - Continue acetaminophen  for fever and pain.   Will give cefdinir  and albuterol  inhaler  Flu negative    Follow up plan: Return if symptoms worsen or fail to improve.  Counseling provided for all of the vaccine components Orders Placed This Encounter  Procedures   Veritor  Flu A/B Waived    Fonda Levins, MD [facility] 04/30/2024, 4:12 PM

## 2024-05-01 LAB — VERITOR FLU A/B WAIVED
Influenza A: NEGATIVE
Influenza B: NEGATIVE

## 2024-05-07 ENCOUNTER — Telehealth: Payer: Self-pay | Admitting: Family Medicine

## 2024-05-07 ENCOUNTER — Ambulatory Visit: Payer: Self-pay

## 2024-05-07 ENCOUNTER — Other Ambulatory Visit: Payer: Self-pay | Admitting: Family

## 2024-05-07 ENCOUNTER — Other Ambulatory Visit: Payer: Self-pay | Admitting: *Deleted

## 2024-05-07 MED ORDER — METFORMIN HCL 500 MG PO TABS: 500.0000 mg | ORAL_TABLET | Freq: Two times a day (BID) | ORAL | 1 refills | Status: DC

## 2024-05-07 MED ORDER — METFORMIN HCL 500 MG PO TABS: 500.0000 mg | ORAL_TABLET | Freq: Two times a day (BID) | ORAL | 2 refills | Status: AC

## 2024-05-07 NOTE — Telephone Encounter (Signed)
 Copied from CRM [id number]. Topic: Clinical - Medication Refill >> May 07, 2024  9:03 AM Alfonso ORN wrote: Medication: metFORMIN  (GLUCOPHAGE ) 500 MG tablet  Has the patient contacted their pharmacy? Yes (Agent: If no, request that the patient contact the pharmacy for the refill. If patient does not wish to contact the pharmacy document the reason why and proceed with request.) (Agent: If yes, when and what did the pharmacy advise?)  This is the patient's preferred pharmacy:  [id number] - [place], [place] - [address] [place] [address] Phone: [phone] Fax: [phone] Is this the correct pharmacy for this prescription? Yes If no, delete pharmacy and type the correct one.   Has the prescription been filled recently? Yes  Is the patient out of the medication? Yes  Has the patient been seen for an appointment in the last year OR does the patient have an upcoming appointment? Yes  Can we respond through MyChart? Yes , can call patient after 3 if need to call   Agent: Please be advised that Rx refills may take up to 3 business days. We ask that you follow-up with your pharmacy.

## 2024-05-07 NOTE — Telephone Encounter (Signed)
 FYI Only or Action Required?: Action required by provider: medication refill request.  Patient was last seen in primary care on 04/30/2024 by Dettinger, Fonda LABOR, MD.  Called Nurse Triage reporting No chief complaint on file..  Symptoms began today.  Interventions attempted: Nothing.  Symptoms are: unchanged.  Triage Disposition: No disposition on file.  Patient/caregiver understands and will follow disposition?:

## 2024-05-07 NOTE — Telephone Encounter (Signed)
 Copied from CRM #[id number]. Topic: Clinical - Medication Question >> May 07, 2024  3:12 PM Lauren C wrote: Reason for CRM: Pt calling regarding refill request on metformin . To answer Bari Learn questions: She says it was last prescribed by a [facility] but she is no longer going there. She is still taking the meds.

## 2024-05-07 NOTE — Telephone Encounter (Signed)
 FYI Only or Action Required?: Action required by provider: medication refill request.  Patient was last seen in primary care on 04/30/2024 by Dettinger, Fonda LABOR, MD.  Called Nurse Triage reporting Blood Sugar Problem.  Symptoms began today.  Interventions attempted: Nothing.  Symptoms are: unchanged.  Triage Disposition: Home Care  Patient/caregiver understands and will follow disposition?: Yes  Med refill sent separately.   Copied from CRM #[id number]. Topic: Clinical - Red Word Triage >> May 07, 2024  3:15 PM Lauren C wrote: Red Word that prompted transfer to Nurse Triage: Pt says she is out of metformin  and her blood sugar is out of whack, does not remember last blood sugar reading. She is concerned about blood sugar spikes. Metformin  prev. Filled by an external provider that she is no longer going to. A fill request was completed this morning. Reason for Disposition  [1] Blood glucose 240 - 300 mg/dL (13.3 - 16.7 mmol/L) AND [2] does not use insulin  (e.g., not insulin -dependent; most people with type 2 diabetes)  Answer Assessment - Initial Assessment Questions 1. BLOOD GLUCOSE: What is your blood glucose level?      States did not take bgl today and states out of whack, states out of metformin  2. ONSET: When did you check the blood glucose?     today 3. USUAL RANGE: What is your glucose level usually? (e.g., usual fasting morning value, usual evening value)     120-130 4. KETONES: Do you check for ketones (urine or blood test strips)? If Yes, ask: What does the test show now?      na 5. TYPE 1 or 2:  Do you know what type of diabetes you have?  (e.g., Type 1, Type 2, Gestational; doesn't know)      Type 2 6. INSULIN : Do you take insulin ? What type of insulin (s) do you use? What is the mode of delivery? (syringe, pen; injection or pump)?      denies 7. DIABETES PILLS: Do you take any pills for your diabetes? If Yes, ask: Have you missed taking any pills  recently?     Metformin  two times per day 8. OTHER SYMPTOMS: Do you have any symptoms? (e.g., fever, frequent urination, difficulty breathing, dizziness, weakness, vomiting)     denies 9. PREGNANCY: Is there any chance you are pregnant? When was your last menstrual period?     na  Protocols used: Diabetes - High Blood Sugar-A-AH

## 2024-05-07 NOTE — Telephone Encounter (Signed)
 Patient would like refill on metformin   you have never sent this in please advise if okay to refill?

## 2024-05-07 NOTE — Telephone Encounter (Signed)
Metformin Prescription sent to pharmacy   

## 2024-05-07 NOTE — Telephone Encounter (Signed)
 Patient aware and verbalized understanding.

## 2024-05-25 ENCOUNTER — Ambulatory Visit: Admitting: Family

## 2024-06-16 DIAGNOSIS — R309 Painful micturition, unspecified: Secondary | ICD-10-CM | POA: Diagnosis not present

## 2024-06-16 DIAGNOSIS — N3001 Acute cystitis with hematuria: Secondary | ICD-10-CM | POA: Diagnosis not present

## 2024-06-21 ENCOUNTER — Encounter: Payer: Self-pay | Admitting: Family

## 2024-06-21 ENCOUNTER — Ambulatory Visit: Payer: Self-pay | Admitting: Family

## 2024-06-21 VITALS — BP 121/76 | HR 77 | Temp 97.5°F | Ht 68.0 in | Wt 144.2 lb

## 2024-06-21 DIAGNOSIS — E559 Vitamin D deficiency, unspecified: Secondary | ICD-10-CM

## 2024-06-21 DIAGNOSIS — I152 Hypertension secondary to endocrine disorders: Secondary | ICD-10-CM | POA: Diagnosis not present

## 2024-06-21 DIAGNOSIS — Z720 Tobacco use: Secondary | ICD-10-CM

## 2024-06-21 DIAGNOSIS — E1159 Type 2 diabetes mellitus with other circulatory complications: Secondary | ICD-10-CM

## 2024-06-21 DIAGNOSIS — M545 Low back pain, unspecified: Secondary | ICD-10-CM | POA: Diagnosis not present

## 2024-06-21 DIAGNOSIS — G8929 Other chronic pain: Secondary | ICD-10-CM | POA: Diagnosis not present

## 2024-06-21 DIAGNOSIS — T451X5A Adverse effect of antineoplastic and immunosuppressive drugs, initial encounter: Secondary | ICD-10-CM

## 2024-06-21 DIAGNOSIS — Z1159 Encounter for screening for other viral diseases: Secondary | ICD-10-CM

## 2024-06-21 DIAGNOSIS — F411 Generalized anxiety disorder: Secondary | ICD-10-CM

## 2024-06-21 DIAGNOSIS — E1169 Type 2 diabetes mellitus with other specified complication: Secondary | ICD-10-CM

## 2024-06-21 DIAGNOSIS — G620 Drug-induced polyneuropathy: Secondary | ICD-10-CM

## 2024-06-21 DIAGNOSIS — Z1211 Encounter for screening for malignant neoplasm of colon: Secondary | ICD-10-CM

## 2024-06-21 LAB — BAYER DCA HB A1C WAIVED: HB A1C (BAYER DCA - WAIVED): 6.9 % — ABNORMAL HIGH (ref 4.8–5.6)

## 2024-06-21 NOTE — Progress Notes (Signed)
 Subjective:    Patient ID: [name], female    DOB: 1965/11/20, 58 y.o.   MRN: [id number]  Chief Complaint  Patient presents with   Medical Management of Chronic Issues   PT presents to the office today for chronic follow up.    She has hx endometrial cancer in 2018. She is still followed by Gynecologists annually. She has peripheral neuropathy from the chemo.  Hypertension This is a chronic problem. The current episode started more than 1 year ago. The problem has been resolved since onset. The problem is controlled. Pertinent negatives include no blurred vision, malaise/fatigue, peripheral edema or shortness of breath. Risk factors for coronary artery disease include dyslipidemia, smoking/tobacco exposure, diabetes mellitus and post-menopausal state. The current treatment provides moderate improvement.  Diabetes She presents for her follow-up diabetic visit. She has type 2 diabetes mellitus. Hypoglycemia symptoms include nervousness/anxiousness. Pertinent negatives for diabetes include no blurred vision and no foot paresthesias. Symptoms are stable. Risk factors for coronary artery disease include dyslipidemia, diabetes mellitus, hypertension, sedentary lifestyle and post-menopausal. She is following a generally healthy diet. Her overall blood glucose range is 110-130 mg/dl. Eye exam is current.  Nicotine  Dependence Presents for follow-up visit. The symptoms have been stable. She smokes < 1/2 a pack of cigarettes per day.  Anxiety Presents for follow-up visit. Symptoms include excessive worry, nervous/anxious behavior and restlessness. Patient reports no shortness of breath. Symptoms occur occasionally. The severity of symptoms is moderate.    Back Pain This is a chronic problem. The current episode started more than 1 year ago. The problem occurs intermittently. The problem has been waxing and waning since onset. The pain is present in the lumbar spine. The quality of the pain  is described as aching. Pain scale: no pain today, but after work can be 8-9. The patient is experiencing no pain. The treatment provided moderate relief.      Review of Systems  Constitutional:  Negative for malaise/fatigue.  Eyes:  Negative for blurred vision.  Respiratory:  Negative for shortness of breath.   Psychiatric/Behavioral:  The patient is nervous/anxious.   All other systems reviewed and are negative.  Family History  Problem Relation Age of Onset   Hypertension Mother    Heart disease Father    Cancer Father        lung ca   Arthritis Sister        rheumatoid   Cancer Brother        prostate   Social History   Socioeconomic History   Marital status: Married    Spouse name: Redell   Number of children: 0   Years of education: Not on file   Highest education level: Not on file  Occupational History   Occupation: engineer, drilling  Tobacco Use   Smoking status: Every Day    Current packs/day: 0.50    Average packs/day: 0.5 packs/day for 34.0 years (17.0 ttl pk-yrs)    Types: Cigarettes   Smokeless tobacco: Never  Vaping Use   Vaping status: Never Used  Substance and Sexual Activity   Alcohol use: No   Drug use: No   Sexual activity: Yes  Other Topics Concern   Not on file  Social History Narrative   Not on file   Social Drivers of Health   Financial Resource Strain: Not on file  Food Insecurity: Not on file  Transportation Needs: Not on file  Physical Activity: Not on file  Stress: Not on file  Social  Connections: Unknown (12/27/2021)   Received from [facility]   Social Network    Social Network: Not on file        Objective:   Physical Exam Vitals reviewed.  Constitutional:      General: She is not in acute distress.    Appearance: She is well-developed.  HENT:     Head: Normocephalic and atraumatic.     Right Ear: Tympanic membrane normal.     Left Ear: Tympanic membrane normal.  Eyes:     Pupils: Pupils are equal, round, and  reactive to light.  Neck:     Thyroid : No thyromegaly.  Cardiovascular:     Rate and Rhythm: Regular rhythm.     Heart sounds: Normal heart sounds. No murmur heard. Pulmonary:     Effort: Pulmonary effort is normal. No respiratory distress.     Breath sounds: Normal breath sounds. No wheezing.  Abdominal:     General: Bowel sounds are normal. There is no distension.     Palpations: Abdomen is soft.     Tenderness: There is no abdominal tenderness.  Musculoskeletal:        General: No tenderness. Normal range of motion.     Cervical back: Normal range of motion and neck supple.  Skin:    General: Skin is warm and dry.  Neurological:     Mental Status: She is alert and oriented to person, place, and time.     Cranial Nerves: No cranial nerve deficit.     Deep Tendon Reflexes: Reflexes are normal and symmetric.  Psychiatric:        Behavior: Behavior normal.        Thought Content: Thought content normal.        Judgment: Judgment normal.       BP 121/76   Pulse 77   Temp (!) 97.5 F (36.4 C) (Temporal)   Ht 5' 8 (1.727 m)   Wt 144 lb 3.2 oz (65.4 kg)   LMP 07/25/2014 Comment: perimenopausal  SpO2 99%   BMI 21.93 kg/m      Assessment & Plan:   [name] comes in today with chief complaint of Medical Management of Chronic Issues   Diagnosis and orders addressed:  1. Chronic bilateral low back pain without sciatica (Primary) - CMP14+EGFR - CBC with Differential/Platelet  2. Type 2 diabetes mellitus with other specified complication, without long-term current use of insulin  (HCC) - Bayer DCA Hb A1c Waived - CMP14+EGFR - CBC with Differential/Platelet  3. GAD (generalized anxiety disorder)  - CMP14+EGFR - CBC with Differential/Platelet  4. Hypertension associated with diabetes (HCC) - CMP14+EGFR - CBC with Differential/Platelet  5. Peripheral neuropathy due to chemotherapy - CMP14+EGFR - CBC with Differential/Platelet  6. Tobacco user  -  CMP14+EGFR - CBC with Differential/Platelet  7. Vitamin D  deficiency - CMP14+EGFR - CBC with Differential/Platelet  8. Need for hepatitis B screening test  - Hepatitis B surface antibody,quantitative  9. Colon cancer screening - Ambulatory referral to Gastroenterology   Labs pending Continue current medications  Keep follow up with specialists  Health Maintenance reviewed Diet and exercise encouraged  Follow up plan: 6 months    Bari Learn, FNP

## 2024-06-21 NOTE — Patient Instructions (Signed)

## 2024-06-22 ENCOUNTER — Ambulatory Visit: Payer: Self-pay | Admitting: Family

## 2024-06-22 LAB — CMP14+EGFR
ALT: 11 IU/L (ref 0–32)
AST: 14 IU/L (ref 0–40)
Albumin: 4.4 g/dL (ref 3.8–4.9)
Alkaline Phosphatase: 93 IU/L (ref 49–135)
BUN/Creatinine Ratio: 17 (ref 9–23)
BUN: 14 mg/dL (ref 6–24)
Bilirubin Total: 0.5 mg/dL (ref 0.0–1.2)
CO2: 24 mmol/L (ref 20–29)
Calcium: 9.8 mg/dL (ref 8.7–10.2)
Chloride: 103 mmol/L (ref 96–106)
Creatinine, Ser: 0.81 mg/dL (ref 0.57–1.00)
Globulin, Total: 2.5 g/dL (ref 1.5–4.5)
Glucose: 110 mg/dL — AB (ref 70–99)
Potassium: 4.7 mmol/L (ref 3.5–5.2)
Sodium: 139 mmol/L (ref 134–144)
Total Protein: 6.9 g/dL (ref 6.0–8.5)
eGFR: 84 mL/min/1.73 (ref >59)

## 2024-06-22 LAB — CBC WITH DIFFERENTIAL/PLATELET
Basophils Absolute: 0 x10E3/uL (ref 0.0–0.2)
Basos: 1 %
EOS (ABSOLUTE): 0.1 x10E3/uL (ref 0.0–0.4)
Eos: 2 %
Hematocrit: 42.9 % (ref 34.0–46.6)
Hemoglobin: 14.7 g/dL (ref 11.1–15.9)
Immature Grans (Abs): 0 x10E3/uL (ref 0.0–0.1)
Immature Granulocytes: 0 %
Lymphocytes Absolute: 2.2 x10E3/uL (ref 0.7–3.1)
Lymphs: 35 %
MCH: 30.9 pg (ref 26.6–33.0)
MCHC: 34.3 g/dL (ref 31.5–35.7)
MCV: 90 fL (ref 79–97)
Monocytes Absolute: 0.4 x10E3/uL (ref 0.1–0.9)
Monocytes: 6 %
Neutrophils Absolute: 3.6 x10E3/uL (ref 1.4–7.0)
Neutrophils: 56 %
Platelets: 284 x10E3/uL (ref 150–450)
RBC: 4.75 x10E6/uL (ref 3.77–5.28)
RDW: 12.1 % (ref 11.7–15.4)
WBC: 6.3 x10E3/uL (ref 3.4–10.8)

## 2024-06-22 LAB — HEPATITIS B SURFACE ANTIBODY, QUANTITATIVE: Hepatitis B Surf Ab Quant: <3.5 m[IU]/mL — AB

## 2024-07-09 ENCOUNTER — Other Ambulatory Visit: Payer: Self-pay | Admitting: Family

## 2024-07-09 DIAGNOSIS — Z1231 Encounter for screening mammogram for malignant neoplasm of breast: Secondary | ICD-10-CM

## 2024-07-11 ENCOUNTER — Inpatient Hospital Stay: Admission: RE | Admit: 2024-07-11 | Source: Ambulatory Visit

## 2024-07-18 ENCOUNTER — Ambulatory Visit: Admitting: Family Medicine

## 2024-07-18 ENCOUNTER — Encounter: Payer: Self-pay | Admitting: Family Medicine

## 2024-07-18 VITALS — BP 120/73 | HR 77 | Temp 97.2°F | Ht 68.0 in | Wt 147.8 lb

## 2024-07-18 DIAGNOSIS — J0140 Acute pansinusitis, unspecified: Secondary | ICD-10-CM

## 2024-07-18 DIAGNOSIS — Z8742 Personal history of other diseases of the female genital tract: Secondary | ICD-10-CM | POA: Diagnosis not present

## 2024-07-18 MED ORDER — DOXYCYCLINE HYCLATE 100 MG PO TABS: 100.0000 mg | ORAL_TABLET | Freq: Two times a day (BID) | ORAL | 0 refills | Status: AC

## 2024-07-18 MED ORDER — FLUCONAZOLE 150 MG PO TABS: 150.0000 mg | ORAL_TABLET | Freq: Once | ORAL | 0 refills | Status: AC

## 2024-07-18 MED ORDER — FLUTICASONE PROPIONATE 50 MCG/ACT NA SUSP: 2.0000 | Freq: Every day | NASAL | 6 refills | Status: AC

## 2024-07-18 NOTE — Progress Notes (Signed)
 Subjective:  Patient ID: [name], female    DOB: 1965/10/23, 58 y.o.   MRN: [id number]  Patient Care Team: Lavell Bari LABOR, FNP as PCP - General (Nurse Practitioner) Vicci Mcardle, OD (Optometry)   Chief Complaint:  Sore Throat and Ear Pain (Right x 2 days )   HPI: [name] is a 58 y.o. female presenting on 07/18/2024 for Sore Throat and Ear Pain (Right x 2 days )   [name] is a 58 year old female who presents with right-sided ear and throat pain.  She woke up yesterday morning with pain in her right ear and throat, accompanied by a cough and nasal congestion. No fever, chills, shortness of breath, wheezing, headaches, or facial pressure. She attended a funeral in cold weather prior to the onset of her symptoms.  She has not been around anyone known to be sick and has not had any recent illnesses. She has not been using nasal sprays like Flonase recently.  Her medical history includes bronchitis a few months ago, for which she was treated with antibiotics. She also has a history of endometrial cancer, urinary tract infections, and yeast infections.          Relevant past medical, surgical, family, and social history reviewed and updated as indicated.  Allergies and medications reviewed and updated. Data reviewed: Chart in Epic.   Past Medical History:  Diagnosis Date   Anxiety    Arthritis    Basal cell carcinoma    face   Diabetes mellitus without complication (HCC)    type II    Endometrial cancer (HCC)    History of bronchitis    History of radiation therapy 01/27/17-02/24/17   vaginal brachytherapy to vaginal cuff 30 Gy in 5 fractions  Dr Lynwood Nasuti   Infection 07/2018   Gum infection     Past Surgical History:  Procedure Laterality Date   BASAL CELL CARCINOMA EXCISION     face   GANGLION CYST EXCISION Left 08/06/2014   Procedure: EXCISION LEFT  WRIST GANGLION;  Surgeon: Franky Curia, MD;  Location: [facility];  Service: Orthopedics;  Laterality: Left;   IR FLUORO GUIDE PORT INSERTION RIGHT  12/15/2016   IR REMOVAL TUN ACCESS W/ PORT W/O FL MOD SED  05/12/2017   IR US  GUIDE VASC ACCESS RIGHT  12/15/2016   KNEE SURGERY     right x3   ROBOTIC ASSISTED TOTAL HYSTERECTOMY WITH BILATERAL SALPINGO OOPHERECTOMY Bilateral 11/16/2016   Procedure: XI ROBOTIC ASSISTED TOTAL HYSTERECTOMY WITH BILATERAL SALPINGO OOPHORECTOMY;  Surgeon: Maurilio Ship, MD;  Location: [facility];  Service: Gynecology;  Laterality: Bilateral;   SENTINEL NODE BIOPSY N/A 11/16/2016   Procedure: SENTINEL NODE BIOPSY;  Surgeon: Maurilio Ship, MD;  Location: [facility];  Service: Gynecology;  Laterality: N/A;   TOOTH EXTRACTION      Social History   Socioeconomic History   Marital status: Married    Spouse name: Redell   Number of children: 0   Years of education: Not on file   Highest education level: Not on file  Occupational History   Occupation: engineer, drilling  Tobacco Use   Smoking status: Every Day    Current packs/day: 0.50    Average packs/day: 0.5 packs/day for 34.0 years (17.0 ttl pk-yrs)    Types: Cigarettes   Smokeless tobacco: Never  Vaping Use   Vaping status: Never Used  Substance and Sexual Activity   Alcohol use: No  Drug use: No   Sexual activity: Yes  Other Topics Concern   Not on file  Social History Narrative   Not on file   Social Drivers of Health   Financial Resource Strain: Not on file  Food Insecurity: Not on file  Transportation Needs: Not on file  Physical Activity: Not on file  Stress: Not on file  Social Connections: Not on file  Intimate Partner Violence: Not on file    Outpatient Encounter Medications as of 07/18/2024  Medication Sig   albuterol  (VENTOLIN  HFA) 108 (90 Base) MCG/ACT inhaler Inhale 2 puffs into the lungs every 6 (six) hours as needed for wheezing or shortness of breath.   Cholecalciferol 50 MCG (2000 UT) CAPS Take 1 capsule by mouth daily.   cyclobenzaprine  (FLEXERIL ) 10 MG  tablet Take 1 tablet (10 mg total) by mouth as needed for muscle spasms.   doxycycline (VIBRA-TABS) 100 MG tablet Take 1 tablet (100 mg total) by mouth 2 (two) times daily for 7 days.   escitalopram  (LEXAPRO ) 10 MG tablet Take 1 tablet (10 mg total) by mouth daily.   fluconazole  (DIFLUCAN ) 150 MG tablet Take 1 tablet (150 mg total) by mouth once for 1 dose.   fluticasone (FLONASE) 50 MCG/ACT nasal spray Place 2 sprays into both nostrils daily.   Lancets (ONETOUCH ULTRASOFT) lancets Check blood sugars daily Dx E11.9   lisinopril  (ZESTRIL ) 2.5 MG tablet Take 1 tablet (2.5 mg total) by mouth daily.   Magnesium 400 MG CAPS Take 1 tablet by mouth daily.   metFORMIN  (GLUCOPHAGE ) 500 MG tablet Take 1 tablet (500 mg total) by mouth 2 (two) times daily with a meal.   pravastatin  (PRAVACHOL ) 20 MG tablet Take 1 tablet (20 mg total) by mouth every evening.   vitamin B-12 (CYANOCOBALAMIN ) 1000 MCG tablet Take 1 tablet by mouth daily.   No facility-administered encounter medications on file as of 07/18/2024.    Allergies  Allergen Reactions   Amoxicillin -Pot Clavulanate Other (See Comments)    Vomiting, broke out with bumps in the vaginal area.   Codeine  Hives   Rosuvastatin      Other reaction(s): muscle and joint pain    Pertinent ROS per HPI, otherwise unremarkable      Objective:  BP 120/73   Pulse 77   Temp (!) 97.2 F (36.2 C)   Ht 5' 8 (1.727 m)   Wt 147 lb 12.8 oz (67 kg)   LMP 07/25/2014 Comment: perimenopausal  SpO2 95%   BMI 22.47 kg/m    Wt Readings from Last 3 Encounters:  07/18/24 147 lb 12.8 oz (67 kg)  06/21/24 144 lb 3.2 oz (65.4 kg)  04/30/24 144 lb (65.3 kg)    Physical Exam Vitals and nursing note reviewed.  Constitutional:      General: She is not in acute distress.    Appearance: She is well-developed and normal weight. She is not ill-appearing, toxic-appearing or diaphoretic.  HENT:     Head: Normocephalic and atraumatic.     Right Ear: A middle ear  effusion is present.     Left Ear: A middle ear effusion is present.     Nose: Congestion present.     Right Turbinates: Enlarged.     Left Turbinates: Enlarged.     Right Sinus: Maxillary sinus tenderness and frontal sinus tenderness present.     Left Sinus: Maxillary sinus tenderness and frontal sinus tenderness present.     Mouth/Throat:     Mouth: Mucous membranes are moist.  Pharynx: Posterior oropharyngeal erythema and postnasal drip present. No oropharyngeal exudate.  Eyes:     Conjunctiva/sclera: Conjunctivae normal.     Pupils: Pupils are equal, round, and reactive to light.  Cardiovascular:     Rate and Rhythm: Normal rate and regular rhythm.     Heart sounds: Normal heart sounds.  Pulmonary:     Effort: Pulmonary effort is normal.     Breath sounds: Normal breath sounds.  Lymphadenopathy:     Cervical: Cervical adenopathy present.  Skin:    General: Skin is warm and dry.     Capillary Refill: Capillary refill takes less than 2 seconds.  Neurological:     General: No focal deficit present.     Mental Status: She is alert and oriented to person, place, and time.  Psychiatric:        Mood and Affect: Mood normal.        Behavior: Behavior normal.        Thought Content: Thought content normal.        Judgment: Judgment normal.      Results for orders placed or performed in visit on 06/21/24  Bayer DCA Hb A1c Waived   Collection Time: 06/21/24 10:26 AM  Result Value Ref Range   HB A1C (BAYER DCA - WAIVED) 6.9 (H) 4.8 - 5.6 %  CMP14+EGFR   Collection Time: 06/21/24 10:29 AM  Result Value Ref Range   Glucose 110 (H) 70 - 99 mg/dL   BUN 14 6 - 24 mg/dL   Creatinine, Ser 9.18 0.57 - 1.00 mg/dL   eGFR 84 >40 fO/fpw/8.26   BUN/Creatinine Ratio 17 9 - 23   Sodium 139 134 - 144 mmol/L   Potassium 4.7 3.5 - 5.2 mmol/L   Chloride 103 96 - 106 mmol/L   CO2 24 20 - 29 mmol/L   Calcium  9.8 8.7 - 10.2 mg/dL   Total Protein 6.9 6.0 - 8.5 g/dL   Albumin 4.4 3.8 - 4.9  g/dL   Globulin, Total 2.5 1.5 - 4.5 g/dL   Bilirubin Total 0.5 0.0 - 1.2 mg/dL   Alkaline Phosphatase 93 49 - 135 IU/L   AST 14 0 - 40 IU/L   ALT 11 0 - 32 IU/L  CBC with Differential/Platelet   Collection Time: 06/21/24 10:29 AM  Result Value Ref Range   WBC 6.3 3.4 - 10.8 x10E3/uL   RBC 4.75 3.77 - 5.28 x10E6/uL   Hemoglobin 14.7 11.1 - 15.9 g/dL   Hematocrit 57.0 65.9 - 46.6 %   MCV 90 79 - 97 fL   MCH 30.9 26.6 - 33.0 pg   MCHC 34.3 31.5 - 35.7 g/dL   RDW 87.8 88.2 - 84.5 %   Platelets 284 150 - 450 x10E3/uL   Neutrophils 56 Not Estab. %   Lymphs 35 Not Estab. %   Monocytes 6 Not Estab. %   Eos 2 Not Estab. %   Basos 1 Not Estab. %   Neutrophils Absolute 3.6 1.4 - 7.0 x10E3/uL   Lymphocytes Absolute 2.2 0.7 - 3.1 x10E3/uL   Monocytes Absolute 0.4 0.1 - 0.9 x10E3/uL   EOS (ABSOLUTE) 0.1 0.0 - 0.4 x10E3/uL   Basophils Absolute 0.0 0.0 - 0.2 x10E3/uL   Immature Granulocytes 0 Not Estab. %   Immature Grans (Abs) 0.0 0.0 - 0.1 x10E3/uL  Hepatitis B surface antibody,quantitative   Collection Time: 06/21/24 10:29 AM  Result Value Ref Range   Hepatitis B Surf Ab Quant <3.5 (L) Immunity>10 mIU/mL  Pertinent labs & imaging results that were available during my care of the patient were reviewed by me and considered in my medical decision making.  Assessment & Plan:  [name] was seen today for sore throat and ear pain.  Diagnoses and all orders for this visit:  Acute non-recurrent pansinusitis -     fluticasone (FLONASE) 50 MCG/ACT nasal spray; Place 2 sprays into both nostrils daily. -     doxycycline (VIBRA-TABS) 100 MG tablet; Take 1 tablet (100 mg total) by mouth 2 (two) times daily for 7 days.  History of vaginitis -     fluconazole  (DIFLUCAN ) 150 MG tablet; Take 1 tablet (150 mg total) by mouth once for 1 dose.        Acute pansinusitis Acute pansinusitis with right-sided ear and throat pain, postnasal drainage, and significant fluid in the right ear. No  fever, chills, or shortness of breath. Likely sinus infection due to recent exposure to cold weather. No recurrent sinus infections. Allergic to Augmentin , codeine , and rosuvastatin . - Prescribed doxycycline twice a day for seven days. - Prescribed Flonase nasal spray every morning for two weeks. - Advised to drink plenty of water  to thin secretions and aid in coughing up mucus. - Instructed to report if symptoms worsen or do not improve. - Discussed potential for urinary tract infection or yeast infection as side effects of antibiotics. - Prescribed Diflucan  for potential yeast infection symptoms. - Advised to report any urinary tract symptoms immediately. - Advised to avoid caffeine to prevent bladder irritation.          Continue all other maintenance medications.  Follow up plan: Return if symptoms worsen or fail to improve.   Continue healthy lifestyle choices, including diet (rich in fruits, vegetables, and lean proteins, and low in salt and simple carbohydrates) and exercise (at least 30 minutes of moderate physical activity daily).  Educational handout given for sinus infection   The above assessment and management plan was discussed with the patient. The patient verbalized understanding of and has agreed to the management plan. Patient is aware to call the clinic if they develop any new symptoms or if symptoms persist or worsen. Patient is aware when to return to the clinic for a follow-up visit. Patient educated on when it is appropriate to go to the emergency department.   Rosaline Bruns, FNP-C [facility] [phone]

## 2024-08-23 ENCOUNTER — Ambulatory Visit: Admitting: Family Medicine

## 2024-08-23 ENCOUNTER — Encounter: Payer: Self-pay | Admitting: Family Medicine

## 2024-08-23 VITALS — BP 129/73 | HR 74 | Temp 97.5°F | Ht 68.0 in | Wt 144.2 lb

## 2024-08-23 DIAGNOSIS — J209 Acute bronchitis, unspecified: Secondary | ICD-10-CM

## 2024-08-23 MED ORDER — PREDNISONE 20 MG PO TABS: 40.0000 mg | ORAL_TABLET | Freq: Every day | ORAL | 0 refills | Status: AC

## 2024-08-23 MED ORDER — AZITHROMYCIN 250 MG PO TABS: ORAL_TABLET | ORAL | 0 refills | Status: AC

## 2024-08-23 NOTE — Progress Notes (Signed)
 "  Acute Office Visit  Subjective:     Patient ID: [name], female    DOB: [birth year], 59 y.o.   MRN: [id number]  Chief Complaint  Patient presents with   Cough   Nasal Congestion    Patient states it started the Saturday after christmas      Cough    History of Present Illness   [name] is a 59 year old female who presents with cough and chest congestion.  Cough and chest congestion - Cough and chest congestion present for approximately one and a half weeks - Congestion initially improved slightly but now persists, especially noticeable at night when preparing to sleep - No sputum production - Coughing fits and wheezing occur - No history of asthma or COPD  Associated respiratory and upper airway symptoms - No shortness of breath - No sinus pressure - No headaches - No ear pain - No significant sore throat, though throat feels mildly irritated  Fever - Fever occurred during the first week of symptoms, particularly on Sunday and Monday - Fever led to missed work days  Tobacco use - Smoker  Symptom management and response to treatment - Using over-the-counter medications: Nyquil at night and [employer] brand cough and congestion medication during the day - Cough persists despite these treatments  Symptom triggers and modifying factors - Attributes some symptoms to returning to work and weather changes       Review of Systems  Respiratory:  Positive for cough.    As per HPI.      Objective:    BP 129/73   Pulse 74   Temp (!) 97.5 F (36.4 C)   Ht 5' 8 (1.727 m)   Wt 144 lb 3.2 oz (65.4 kg)   LMP 07/25/2014 Comment: perimenopausal  SpO2 99%   BMI 21.93 kg/m    Physical Exam Vitals and nursing note reviewed.  Constitutional:      General: She is not in acute distress.    Appearance: She is not ill-appearing, toxic-appearing or diaphoretic.  HENT:     Right Ear: Tympanic membrane, ear canal and external ear normal.     Left  Ear: Tympanic membrane, ear canal and external ear normal.     Nose: Congestion present.     Mouth/Throat:     Mouth: Mucous membranes are moist.     Pharynx: Oropharynx is clear. No oropharyngeal exudate or posterior oropharyngeal erythema.     Tonsils: No tonsillar exudate or tonsillar abscesses. 0 on the right. 0 on the left.  Eyes:     General:        Right eye: No discharge.        Left eye: No discharge.     Conjunctiva/sclera: Conjunctivae normal.  Cardiovascular:     Rate and Rhythm: Normal rate and regular rhythm.     Heart sounds: Normal heart sounds. No murmur heard. Pulmonary:     Effort: Pulmonary effort is normal. No respiratory distress.     Breath sounds: Normal breath sounds. No wheezing, rhonchi or rales.  Chest:     Chest wall: No tenderness.  Musculoskeletal:     Cervical back: Neck supple. No rigidity.  Skin:    General: Skin is warm and dry.  Neurological:     General: No focal deficit present.     Mental Status: She is alert and oriented to person, place, and time.  Psychiatric:        Mood and Affect: Mood  normal.        Behavior: Behavior normal.     No results found for any visits on 08/23/24.      Assessment & Plan:   Birdell was seen today for cough and nasal congestion.  Diagnoses and all orders for this visit:  Acute bronchitis, unspecified organism -     azithromycin  (ZITHROMAX  Z-PAK) 250 MG tablet; As directed -     predniSONE  (DELTASONE ) 20 MG tablet; Take 2 tablets (40 mg total) by mouth daily with breakfast for 5 days.   Assessment and Plan    Acute bronchitis Treated for bronchitis based on symptoms and history. Will treat empirically with abx given tobacco use.  - Prescribed azithromycin : two tablets on day one, then one tablet daily for five days. - Prescribed prednisone : two tablets once daily for five days. - Advised continuation of over-the-counter decongestants. - Instructed to report if symptoms do not improve or worsen.       Return to office for new or worsening symptoms, or if symptoms persist.   The patient indicates understanding of these issues and agrees with the plan.  Tammy CHRISTELLA Search, FNP   "

## 2024-12-20 ENCOUNTER — Ambulatory Visit: Admitting: Family
